# Patient Record
Sex: Female | Born: 1935 | Race: Black or African American | Hispanic: No | Smoking: Never smoker
Health system: Southern US, Community
[De-identification: ages and names within clinical notes are randomized; demographics above are authoritative.]

## PROBLEM LIST (undated history)

## (undated) ENCOUNTER — Emergency Department (HOSPITAL_COMMUNITY): Disposition: A | Discharge status: Home / Self Care | Payer: Medicare Other

## (undated) DIAGNOSIS — E119 Type 2 diabetes mellitus without complications: Secondary | ICD-10-CM

## (undated) DIAGNOSIS — F419 Anxiety disorder, unspecified: Secondary | ICD-10-CM

## (undated) DIAGNOSIS — M810 Age-related osteoporosis without current pathological fracture: Secondary | ICD-10-CM

## (undated) DIAGNOSIS — N189 Chronic kidney disease, unspecified: Secondary | ICD-10-CM

## (undated) DIAGNOSIS — K219 Gastro-esophageal reflux disease without esophagitis: Secondary | ICD-10-CM

## (undated) DIAGNOSIS — F0390 Unspecified dementia without behavioral disturbance: Secondary | ICD-10-CM

---

## 2000-02-21 ENCOUNTER — Encounter: Payer: Self-pay | Admitting: Emergency Medicine

## 2000-02-21 ENCOUNTER — Emergency Department (HOSPITAL_COMMUNITY): Admission: EM | Admit: 2000-02-21 | Discharge: 2000-02-21 | Payer: Self-pay | Admitting: Emergency Medicine

## 2003-04-19 ENCOUNTER — Emergency Department (HOSPITAL_COMMUNITY): Admission: EM | Admit: 2003-04-19 | Discharge: 2003-04-19 | Payer: Self-pay | Admitting: Family Medicine

## 2003-08-19 ENCOUNTER — Emergency Department (HOSPITAL_COMMUNITY): Admission: EM | Admit: 2003-08-19 | Discharge: 2003-08-19 | Payer: Self-pay | Admitting: Family Medicine

## 2003-11-13 ENCOUNTER — Emergency Department (HOSPITAL_COMMUNITY): Admission: EM | Admit: 2003-11-13 | Discharge: 2003-11-13 | Payer: Self-pay | Admitting: Family Medicine

## 2004-03-10 ENCOUNTER — Emergency Department (HOSPITAL_COMMUNITY): Admission: EM | Admit: 2004-03-10 | Discharge: 2004-03-10 | Payer: Self-pay | Admitting: Family Medicine

## 2004-04-26 ENCOUNTER — Emergency Department (HOSPITAL_COMMUNITY): Admission: EM | Admit: 2004-04-26 | Discharge: 2004-04-26 | Payer: Self-pay | Admitting: Emergency Medicine

## 2005-03-13 ENCOUNTER — Emergency Department (HOSPITAL_COMMUNITY): Admission: EM | Admit: 2005-03-13 | Discharge: 2005-03-13 | Payer: Self-pay | Admitting: Family Medicine

## 2005-06-29 ENCOUNTER — Emergency Department (HOSPITAL_COMMUNITY): Admission: EM | Admit: 2005-06-29 | Discharge: 2005-06-29 | Payer: Self-pay | Admitting: Family Medicine

## 2005-07-23 ENCOUNTER — Emergency Department (HOSPITAL_COMMUNITY): Admission: EM | Admit: 2005-07-23 | Discharge: 2005-07-23 | Payer: Self-pay | Admitting: Emergency Medicine

## 2005-09-07 ENCOUNTER — Emergency Department (HOSPITAL_COMMUNITY): Admission: EM | Admit: 2005-09-07 | Discharge: 2005-09-07 | Payer: Self-pay | Admitting: Family Medicine

## 2006-08-01 ENCOUNTER — Emergency Department (HOSPITAL_COMMUNITY): Admission: EM | Admit: 2006-08-01 | Discharge: 2006-08-01 | Payer: Self-pay | Admitting: Family Medicine

## 2006-11-02 ENCOUNTER — Encounter: Admission: RE | Admit: 2006-11-02 | Discharge: 2006-11-02 | Payer: Self-pay | Admitting: Orthopedic Surgery

## 2008-04-04 ENCOUNTER — Emergency Department (HOSPITAL_COMMUNITY): Admission: EM | Admit: 2008-04-04 | Discharge: 2008-04-04 | Payer: Self-pay | Admitting: Emergency Medicine

## 2008-08-27 ENCOUNTER — Emergency Department (HOSPITAL_COMMUNITY): Admission: EM | Admit: 2008-08-27 | Discharge: 2008-08-27 | Payer: Self-pay | Admitting: Family Medicine

## 2011-05-11 ENCOUNTER — Encounter (HOSPITAL_COMMUNITY): Payer: Self-pay

## 2011-05-11 ENCOUNTER — Other Ambulatory Visit: Payer: Self-pay

## 2011-05-11 ENCOUNTER — Observation Stay (HOSPITAL_COMMUNITY)
Admission: EM | Admit: 2011-05-11 | Discharge: 2011-05-14 | Disposition: A | Discharge status: Home / Self Care | Payer: Medicare Other | Attending: Internal Medicine | Admitting: Internal Medicine

## 2011-05-11 ENCOUNTER — Emergency Department (HOSPITAL_COMMUNITY): Payer: Medicare Other

## 2011-05-11 DIAGNOSIS — I10 Essential (primary) hypertension: Secondary | ICD-10-CM

## 2011-05-11 DIAGNOSIS — G309 Alzheimer's disease, unspecified: Secondary | ICD-10-CM | POA: Insufficient documentation

## 2011-05-11 DIAGNOSIS — F0280 Dementia in other diseases classified elsewhere without behavioral disturbance: Secondary | ICD-10-CM | POA: Insufficient documentation

## 2011-05-11 DIAGNOSIS — E785 Hyperlipidemia, unspecified: Secondary | ICD-10-CM

## 2011-05-11 DIAGNOSIS — E559 Vitamin D deficiency, unspecified: Secondary | ICD-10-CM | POA: Insufficient documentation

## 2011-05-11 DIAGNOSIS — I2699 Other pulmonary embolism without acute cor pulmonale: Principal | ICD-10-CM | POA: Insufficient documentation

## 2011-05-11 DIAGNOSIS — E7800 Pure hypercholesterolemia, unspecified: Secondary | ICD-10-CM | POA: Insufficient documentation

## 2011-05-11 DIAGNOSIS — M810 Age-related osteoporosis without current pathological fracture: Secondary | ICD-10-CM | POA: Insufficient documentation

## 2011-05-11 DIAGNOSIS — F0390 Unspecified dementia without behavioral disturbance: Secondary | ICD-10-CM

## 2011-05-11 DIAGNOSIS — R079 Chest pain, unspecified: Secondary | ICD-10-CM | POA: Insufficient documentation

## 2011-05-11 LAB — COMPREHENSIVE METABOLIC PANEL
AST: 16 U/L (ref 0–37)
Albumin: 3.7 g/dL (ref 3.5–5.2)
Calcium: 9.9 mg/dL (ref 8.4–10.5)
Creatinine, Ser: 0.81 mg/dL (ref 0.50–1.10)

## 2011-05-11 LAB — POCT I-STAT TROPONIN I: Troponin i, poc: 0 ng/mL (ref 0.00–0.08)

## 2011-05-11 LAB — PROTIME-INR: INR: 1.01 (ref 0.00–1.49)

## 2011-05-11 LAB — APTT: aPTT: 41 seconds — ABNORMAL HIGH (ref 24–37)

## 2011-05-11 LAB — CBC
MCH: 29.9 pg (ref 26.0–34.0)
MCV: 91.8 fL (ref 78.0–100.0)
Platelets: 270 10*3/uL (ref 150–400)
RDW: 13 % (ref 11.5–15.5)
WBC: 5.7 10*3/uL (ref 4.0–10.5)

## 2011-05-11 LAB — D-DIMER, QUANTITATIVE: D-Dimer, Quant: 3.54 ug/mL-FEU — ABNORMAL HIGH (ref 0.00–0.48)

## 2011-05-11 MED ORDER — IOHEXOL 350 MG/ML SOLN
70.0000 mL | Freq: Once | INTRAVENOUS | Status: AC | PRN
  Administered 2011-05-11: 70 mL via INTRAVENOUS

## 2011-05-11 MED ORDER — SODIUM CHLORIDE 0.9 % IV SOLN
20.0000 mL | INTRAVENOUS | Status: DC
  Administered 2011-05-11: 20 mL/h via INTRAVENOUS

## 2011-05-11 NOTE — ED Notes (Signed)
Pt. Resting on stretcher. Family at bedside. No needs voiced at this time.

## 2011-05-11 NOTE — ED Notes (Signed)
CXR done at bedside.

## 2011-05-11 NOTE — ED Notes (Signed)
Pt was brought in by EMS with c/o chest pain while she was doing physical therapy. Pt is alert but confused

## 2011-05-11 NOTE — ED Notes (Signed)
Pt was brought in by EMS with c/o chest pain onset when she was doing physical therapy. Pt was given 4 Baby ASA PTA

## 2011-05-11 NOTE — ED Provider Notes (Signed)
History     CSN: [id number]  Arrival date & time 05/11/11  4098   First MD Initiated Contact with Patient 05/11/11 1811      Chief Complaint  Patient presents with  . Chest Pain    HPI Pt was doing physical therapy today at the nursing home when she complained of chest pain.  Pt cannot recall the details.  No history of heart or lung disease.  Pt states she feels fine now.  Pt has had anxiety attacks before but this is not exactly like that.  Pt states all she knows is she was hurting.  She was given ASA by EMS which has helped. Patient denies any symptoms now. There's been no recent leg swelling, fevers, or cough. The history is limited by the fact that the patient has dementia. Past Medical History  Diagnosis Date  . Hypertension   . Hypercholesterolemia   . Alzheimer disease   . Osteoporosis   . Tendonitis   . Vitamin d deficiency     No past surgical history on file.  No family history on file.  History  Substance Use Topics  . Smoking status: Not on file  . Smokeless tobacco: Not on file  . Alcohol Use: No    OB History    Grav Para Term Preterm Abortions TAB SAB Ect Mult Living                  Review of Systems  Unable to perform ROS: Dementia    Allergies  Review of patient's allergies indicates no known allergies.  Home Medications  No current outpatient prescriptions on file.  BP 157/71  Pulse 76  Temp(Src) 98.6 F (37 C) (Oral)  Resp 16  Wt 170 lb (77.111 kg)  SpO2 97%  Physical Exam  Nursing note and vitals reviewed. Constitutional: She appears well-developed and well-nourished. No distress.  HENT:  Head: Normocephalic and atraumatic.  Right Ear: External ear normal.  Left Ear: External ear normal.  Eyes: Conjunctivae are normal. Right eye exhibits no discharge. Left eye exhibits no discharge. No scleral icterus.  Neck: Neck supple. No tracheal deviation present.  Cardiovascular: Normal rate, regular rhythm and intact distal pulses.     Pulmonary/Chest: Effort normal and breath sounds normal. No stridor. No respiratory distress. She has no wheezes. She has no rales.  Abdominal: Soft. Bowel sounds are normal. She exhibits no distension. There is no tenderness. There is no rebound and no guarding.  Musculoskeletal: She exhibits no edema and no tenderness.  Neurological: She is alert. She has normal strength. No sensory deficit. Cranial nerve deficit:  no gross defecits noted. She exhibits normal muscle tone. She displays no seizure activity. Coordination normal.  Skin: Skin is warm and dry. No rash noted.  Psychiatric: She has a normal mood and affect.    ED Course  Procedures (including critical care time)  Date: 05/11/2011  Rate: 72  Rhythm: normal sinus rhythm  QRS Axis: left  Intervals: normal  ST/T Wave abnormalities: normal  Conduction Disutrbances:nonspecific intraventricular conduction delay  Narrative Interpretation: Pacer spikes not noted on current EKG otherwise no significant changes  Old EKG Reviewed: changes noted   Labs Reviewed  CBC - Abnormal; Notable for the following:    Hemoglobin 11.7 (*)    HCT 35.9 (*)    All other components within normal limits  COMPREHENSIVE METABOLIC PANEL - Abnormal; Notable for the following:    Glucose, Bld 113 (*)    Total Bilirubin 0.2 (*)  GFR calc non Af Amer 63 (*)    GFR calc Af Amer 73 (*)    All other components within normal limits  APTT - Abnormal; Notable for the following:    aPTT 41 (*)    All other components within normal limits  D-DIMER, QUANTITATIVE - Abnormal; Notable for the following:    D-Dimer, Quant 3.54 (*)    All other components within normal limits  PROTIME-INR  POCT I-STAT TROPONIN I  POCT I-STAT TROPONIN I   Ct Angio Chest W/cm &/or Wo Cm  05/12/2011  *RADIOLOGY REPORT*  Clinical Data: 78 year old with chest pain and shortness of breath.  CT ANGIOGRAPHY CHEST  Technique:  Multidetector CT imaging of the chest using the standard  protocol during bolus administration of intravenous contrast. Multiplanar reconstructed images including MIPs were obtained and reviewed to evaluate the vascular anatomy.  Contrast: 70mL OMNIPAQUE IOHEXOL 350 MG/ML IV SOLN  Comparison: Chest radiograph 05/11/2011  Findings: The main pulmonary arteries are patent.  There is a linear filling defect within the right lower lobe pulmonary artery near a branch point.  This is best seen on image 6, image 145. There is concern for a possible filling defect in a subsegmental branch in the right lower lobe on image 168.  There are no other definite filling defects within the pulmonary arterial tree.  No evidence for chest lymphadenopathy.  There are coronary artery calcifications.  There is a small hiatal hernia.  No significant pericardial or pleural fluid.  There is a large cystic type structure in the left upper abdomen that measures up to 6.9 cm. This cyst is causing mass effect on the pancreas and could be originating from the left kidney. Multiple low-density structures throughout the visualized liver most likely represent cysts.  The trachea and mainstem bronchi are patent.  No acute bony abnormality.  Lungs are clear.  IMPRESSION: There are small filling defects in the right lower lobe pulmonary artery near a branch point.  There may be a thrombosed sub segmental branch within the right lower lobe.  Findings raise concern for pulmonary emboli but the chronicity is unknown.  This could represent subacute or chronic thrombus.  Evidence for multiple hepatic cysts.  There is a large cystic structure in the left upper abdomen which is incompletely imaged and could be arising from the left kidney.  Hiatal hernia.  These results were called by telephone on 05/12/2011  at  12:03 a.m. to  Dr. Lynelle Doctor, who verbally acknowledged these results.  Original Report Authenticated By: Richarda Overlie, M.D.   Dg Chest Portable 1 View  05/11/2011  *RADIOLOGY REPORT*  Clinical Data: Chest  pain.  Hypertension.  PORTABLE CHEST - 1 VIEW  Comparison: None.  Findings: The heart size is within normal limits for portable technique.  Both lungs are well aerated and are clear.  No evidence of pleural effusion.  No evidence of tracheal deviation.  IMPRESSION: No acute findings.  Original Report Authenticated By: Danae Orleans, M.D.     1. Chest pain   2. Pulmonary embolism       MDM  SUspect acute PE with her increased d dimer, ct findings  and symptoms.  Will admit the patient to the hospital for further treatment.  Discussed with Dr Gilford Silvius.  We will start xeralta        Celene Kras, MD 05/12/11 [phone]

## 2011-05-12 ENCOUNTER — Encounter (HOSPITAL_COMMUNITY): Payer: Self-pay | Admitting: Internal Medicine

## 2011-05-12 DIAGNOSIS — I517 Cardiomegaly: Secondary | ICD-10-CM

## 2011-05-12 DIAGNOSIS — I2699 Other pulmonary embolism without acute cor pulmonale: Secondary | ICD-10-CM

## 2011-05-12 DIAGNOSIS — F0390 Unspecified dementia without behavioral disturbance: Secondary | ICD-10-CM | POA: Diagnosis present

## 2011-05-12 LAB — COMPREHENSIVE METABOLIC PANEL
ALT: 17 U/L (ref 0–35)
AST: 14 U/L (ref 0–37)
Albumin: 3.6 g/dL (ref 3.5–5.2)
Calcium: 10.1 mg/dL (ref 8.4–10.5)
GFR calc Af Amer: 85 mL/min — ABNORMAL LOW (ref >90)
Sodium: 144 mEq/L (ref 135–145)
Total Protein: 6.6 g/dL (ref 6.0–8.3)

## 2011-05-12 LAB — POCT I-STAT TROPONIN I: Troponin i, poc: 0.01 ng/mL (ref 0.00–0.08)

## 2011-05-12 LAB — CBC
MCV: 90.1 fL (ref 78.0–100.0)
Platelets: 265 10*3/uL (ref 150–400)
RDW: 12.9 % (ref 11.5–15.5)
WBC: 6.1 10*3/uL (ref 4.0–10.5)

## 2011-05-12 LAB — CARDIAC PANEL(CRET KIN+CKTOT+MB+TROPI)
CK, MB: 2.5 ng/mL (ref 0.3–4.0)
CK, MB: 2.3 ng/mL (ref 0.3–4.0)
Relative Index: 1.9 (ref 0.0–2.5)
Total CK: 120 U/L (ref 7–177)
Total CK: 126 U/L (ref 7–177)
Total CK: 120 U/L (ref 7–177)
Troponin I: <0.3 ng/mL (ref <0.30)

## 2011-05-12 MED ORDER — ACETAMINOPHEN 650 MG RE SUPP: 650.0000 mg | Freq: Four times a day (QID) | RECTAL | Status: DC | PRN

## 2011-05-12 MED ORDER — OLANZAPINE 2.5 MG PO TABS
2.5000 mg | ORAL_TABLET | Freq: Every day | ORAL | Status: DC
  Administered 2011-05-12 – 2011-05-13 (×2): 2.5 mg via ORAL
  Filled 2011-05-12 (×4): qty 1

## 2011-05-12 MED ORDER — ONDANSETRON HCL 4 MG/2ML IJ SOLN: 4.0000 mg | Freq: Four times a day (QID) | INTRAMUSCULAR | Status: DC | PRN

## 2011-05-12 MED ORDER — RIVAROXABAN 15 MG PO TABS
15.0000 mg | ORAL_TABLET | Freq: Two times a day (BID) | ORAL | Status: DC
  Filled 2011-05-12 (×2): qty 1

## 2011-05-12 MED ORDER — OYSTER CALCIUM 500 MG PO TABS
500.0000 mg | ORAL_TABLET | Freq: Two times a day (BID) | ORAL | Status: DC
Start: 2011-05-12 — End: 2011-05-14
  Administered 2011-05-12 – 2011-05-14 (×4): 500 mg via ORAL
  Filled 2011-05-12 (×8): qty 1

## 2011-05-12 MED ORDER — RIVAROXABAN 15 MG PO TABS
15.0000 mg | ORAL_TABLET | Freq: Two times a day (BID) | ORAL | Status: DC
  Administered 2011-05-12 – 2011-05-14 (×4): 15 mg via ORAL
  Filled 2011-05-12 (×7): qty 1

## 2011-05-12 MED ORDER — SODIUM CHLORIDE 0.9 % IV SOLN: INTRAVENOUS | Status: DC

## 2011-05-12 MED ORDER — ONDANSETRON HCL 4 MG PO TABS: 4.0000 mg | ORAL_TABLET | Freq: Four times a day (QID) | ORAL | Status: DC | PRN

## 2011-05-12 MED ORDER — DONEPEZIL HCL 10 MG PO TABS
10.0000 mg | ORAL_TABLET | Freq: Every day | ORAL | Status: DC
  Administered 2011-05-12 – 2011-05-13 (×2): 10 mg via ORAL
  Filled 2011-05-12 (×4): qty 1

## 2011-05-12 MED ORDER — RIVAROXABAN 15 MG PO TABS
15.0000 mg | ORAL_TABLET | ORAL | Status: AC
  Administered 2011-05-12: 15 mg via ORAL
  Filled 2011-05-12: qty 1

## 2011-05-12 MED ORDER — SODIUM CHLORIDE 0.9 % IJ SOLN
3.0000 mL | Freq: Two times a day (BID) | INTRAMUSCULAR | Status: DC
  Administered 2011-05-12 – 2011-05-14 (×3): 3 mL via INTRAVENOUS

## 2011-05-12 MED ORDER — VITAMIN D3 25 MCG (1000 UNIT) PO TABS
1000.0000 [IU] | ORAL_TABLET | Freq: Every day | ORAL | Status: DC
  Administered 2011-05-12 – 2011-05-14 (×2): 1000 [IU] via ORAL
  Filled 2011-05-12 (×4): qty 1

## 2011-05-12 MED ORDER — PANTOPRAZOLE SODIUM 40 MG PO TBEC
40.0000 mg | DELAYED_RELEASE_TABLET | Freq: Every day | ORAL | Status: DC
  Administered 2011-05-12 – 2011-05-14 (×2): 40 mg via ORAL
  Filled 2011-05-12 (×3): qty 1

## 2011-05-12 MED ORDER — LISINOPRIL 10 MG PO TABS
10.0000 mg | ORAL_TABLET | Freq: Every day | ORAL | Status: DC
  Administered 2011-05-12 – 2011-05-14 (×3): 10 mg via ORAL
  Filled 2011-05-12 (×4): qty 1

## 2011-05-12 MED ORDER — ALPRAZOLAM 0.25 MG PO TABS
0.2500 mg | ORAL_TABLET | Freq: Once | ORAL | Status: AC
  Administered 2011-05-12: 0.25 mg via ORAL
  Filled 2011-05-12: qty 1

## 2011-05-12 MED ORDER — ACETAMINOPHEN 325 MG PO TABS
650.0000 mg | ORAL_TABLET | Freq: Four times a day (QID) | ORAL | Status: DC | PRN
  Administered 2011-05-13: 650 mg via ORAL
  Filled 2011-05-12: qty 2

## 2011-05-12 MED ORDER — SIMVASTATIN 40 MG PO TABS
40.0000 mg | ORAL_TABLET | Freq: Every day | ORAL | Status: DC
  Administered 2011-05-12 – 2011-05-13 (×2): 40 mg via ORAL
  Filled 2011-05-12 (×4): qty 1

## 2011-05-12 MED ORDER — RIVAROXABAN 15 MG PO TABS
15.0000 mg | ORAL_TABLET | Freq: Two times a day (BID) | ORAL | Status: DC
  Administered 2011-05-12: 15 mg via ORAL
  Filled 2011-05-12 (×2): qty 1

## 2011-05-12 NOTE — Progress Notes (Signed)
*  PRELIMINARY RESULTS* Vascular Ultrasound Lower extremity venous duplex has been completed.  Preliminary findings: Bilaterally no evidence of DVT or baker's cyst.  Farrel Demark RDMS 05/12/2011, 11:12 AM

## 2011-05-12 NOTE — Progress Notes (Signed)
Utilization review complete 

## 2011-05-12 NOTE — ED Notes (Signed)
Pt. Transferred to the floor via stretcher with cardiac monitor and tele tech.

## 2011-05-12 NOTE — H&P (Signed)
Pamela Ramirez is an 78 y.o. female.   Patient lives in the dementia unit of [facility]. History provided by ER physician and patient's daughter. Chief Complaint: Chest pain. HPI: 78 year-old female with history of hypertension hyperlipidemia and dementia was brought into the ER patient complained of chest pain last evening. Patient states she has had chest pain but does not recall how long and is not able to characterize the pain. She points to the sternal area when asked which area was it hurting. Patient did not have any shortness of breath nausea vomiting cough palpitation dizziness or loss of consciousness. In the ER patient had EKG and cardiac enzymes which were negative. Since patient's d-dimer was high CT angiogram of the chest was done which was showing a filling defect which possibly is pulmonary embolism. Given the clinical history and lab work this most likely is pulmonary embolism. At this time patient has been started on xarelto. Patient be admitted for further observation. Patient is chest pain-free.  Past Medical History  Diagnosis Date  . Hypertension   . Hypercholesterolemia   . Alzheimer disease   . Osteoporosis   . Tendonitis   . Vitamin d deficiency     History reviewed. No pertinent past surgical history.  History reviewed. No pertinent family history. Social History:  reports that she has never smoked. She does not have any smokeless tobacco history on file. She reports that she does not drink alcohol or use illicit drugs.  Allergies: No Known Allergies  Medications Prior to Admission  Medication Dose Route Frequency Provider Last Rate Last Dose  . 0.9 %  sodium chloride infusion  20 mL Intravenous Continuous Celene Kras, MD 20 mL/hr at 05/11/11 1850 20 mL/hr at 05/11/11 1850  . iohexol (OMNIPAQUE) 350 MG/ML injection 70 mL  70 mL Intravenous Once PRN Medication Radiologist, MD   70 mL at 05/11/11 2301  . Rivaroxaban (XARELTO) tablet TABS 15 mg  15 mg Oral  To Minor Celene Kras, MD   15 mg at 05/12/11 0211   No current outpatient prescriptions on file as of 05/11/2011.    Results for orders placed during the hospital encounter of 05/11/11 (from the past 48 hour(s))  CBC     Status: Abnormal   Collection Time   05/11/11  6:42 PM      Component Value Range Comment   WBC 5.7  4.0 - 10.5 (K/uL)    RBC 3.91  3.87 - 5.11 (MIL/uL)    Hemoglobin 11.7 (*) 12.0 - 15.0 (g/dL)    HCT 16.1 (*) 09.6 - 46.0 (%)    MCV 91.8  78.0 - 100.0 (fL)    MCH 29.9  26.0 - 34.0 (pg)    MCHC 32.6  30.0 - 36.0 (g/dL)    RDW 04.5  40.9 - 81.1 (%)    Platelets 270  150 - 400 (K/uL)   COMPREHENSIVE METABOLIC PANEL     Status: Abnormal   Collection Time   05/11/11  6:42 PM      Component Value Range Comment   Sodium 139  135 - 145 (mEq/L)    Potassium 4.2  3.5 - 5.1 (mEq/L)    Chloride 104  96 - 112 (mEq/L)    CO2 26  19 - 32 (mEq/L)    Glucose, Bld 113 (*) 70 - 99 (mg/dL)    BUN 13  6 - 23 (mg/dL)    Creatinine, Ser 9.14  0.50 - 1.10 (mg/dL)  Calcium 9.9  8.4 - 10.5 (mg/dL)    Total Protein 6.7  6.0 - 8.3 (g/dL)    Albumin 3.7  3.5 - 5.2 (g/dL)    AST 16  0 - 37 (U/L)    ALT 18  0 - 35 (U/L)    Alkaline Phosphatase 59  39 - 117 (U/L)    Total Bilirubin 0.2 (*) 0.3 - 1.2 (mg/dL)    GFR calc non Af Amer 63 (*) >90 (mL/min)    GFR calc Af Amer 73 (*) >90 (mL/min)   PROTIME-INR     Status: Normal   Collection Time   05/11/11  6:42 PM      Component Value Range Comment   Prothrombin Time 13.5  11.6 - 15.2 (seconds)    INR 1.01  0.00 - 1.49    APTT     Status: Abnormal   Collection Time   05/11/11  6:42 PM      Component Value Range Comment   aPTT 41 (*) 24 - 37 (seconds)   D-DIMER, QUANTITATIVE     Status: Abnormal   Collection Time   05/11/11  6:42 PM      Component Value Range Comment   D-Dimer, Quant 3.54 (*) 0.00 - 0.48 (ug/mL-FEU)   POCT I-STAT TROPONIN I     Status: Normal   Collection Time   05/11/11  7:42 PM      Component Value Range Comment    Troponin i, poc 0.00  0.00 - 0.08 (ng/mL)    Comment 3            POCT I-STAT TROPONIN I     Status: Normal   Collection Time   05/11/11  9:28 PM      Component Value Range Comment   Troponin i, poc 0.00  0.00 - 0.08 (ng/mL)    Comment 3            POCT I-STAT TROPONIN I     Status: Normal   Collection Time   05/12/11 12:58 AM      Component Value Range Comment   Troponin i, poc 0.01  0.00 - 0.08 (ng/mL)    Comment 3             Ct Angio Chest W/cm &/or Wo Cm  05/12/2011  *RADIOLOGY REPORT*  Clinical Data: 78 year old with chest pain and shortness of breath.  CT ANGIOGRAPHY CHEST  Technique:  Multidetector CT imaging of the chest using the standard protocol during bolus administration of intravenous contrast. Multiplanar reconstructed images including MIPs were obtained and reviewed to evaluate the vascular anatomy.  Contrast: 70mL OMNIPAQUE IOHEXOL 350 MG/ML IV SOLN  Comparison: Chest radiograph 05/11/2011  Findings: The main pulmonary arteries are patent.  There is a linear filling defect within the right lower lobe pulmonary artery near a branch point.  This is best seen on image 6, image 145. There is concern for a possible filling defect in a subsegmental branch in the right lower lobe on image 168.  There are no other definite filling defects within the pulmonary arterial tree.  No evidence for chest lymphadenopathy.  There are coronary artery calcifications.  There is a small hiatal hernia.  No significant pericardial or pleural fluid.  There is a large cystic type structure in the left upper abdomen that measures up to 6.9 cm. This cyst is causing mass effect on the pancreas and could be originating from the left kidney. Multiple low-density structures throughout the visualized liver  most likely represent cysts.  The trachea and mainstem bronchi are patent.  No acute bony abnormality.  Lungs are clear.  IMPRESSION: There are small filling defects in the right lower lobe pulmonary artery near a  branch point.  There may be a thrombosed sub segmental branch within the right lower lobe.  Findings raise concern for pulmonary emboli but the chronicity is unknown.  This could represent subacute or chronic thrombus.  Evidence for multiple hepatic cysts.  There is a large cystic structure in the left upper abdomen which is incompletely imaged and could be arising from the left kidney.  Hiatal hernia.  These results were called by telephone on 05/12/2011  at  12:03 a.m. to  Dr. Lynelle Doctor, who verbally acknowledged these results.  Original Report Authenticated By: Richarda Overlie, M.D.   Dg Chest Portable 1 View  05/11/2011  *RADIOLOGY REPORT*  Clinical Data: Chest pain.  Hypertension.  PORTABLE CHEST - 1 VIEW  Comparison: None.  Findings: The heart size is within normal limits for portable technique.  Both lungs are well aerated and are clear.  No evidence of pleural effusion.  No evidence of tracheal deviation.  IMPRESSION: No acute findings.  Original Report Authenticated By: Danae Orleans, M.D.    Review of Systems  Constitutional: Negative.   HENT: Negative.   Eyes: Negative.   Respiratory: Negative.   Cardiovascular: Positive for chest pain.  Gastrointestinal: Negative.   Genitourinary: Negative.   Musculoskeletal: Negative.   Skin: Negative.   Neurological: Negative.   Endo/Heme/Allergies: Negative.   Psychiatric/Behavioral: Negative.     Blood pressure 108/49, pulse 56, temperature 98.6 F (37 C), temperature source Oral, resp. rate 22, weight 77.111 kg (170 lb), SpO2 93.00%. Physical Exam  Constitutional: She is oriented to person, place, and time. She appears well-developed and well-nourished. No distress.  HENT:  Head: Normocephalic and atraumatic.  Right Ear: External ear normal.  Left Ear: External ear normal.  Nose: Nose normal.  Mouth/Throat: Oropharynx is clear and moist. No oropharyngeal exudate.  Eyes: Conjunctivae are normal. Pupils are equal, round, and reactive to light.  Right eye exhibits no discharge. Left eye exhibits no discharge.  Neck: Normal range of motion. Neck supple.  Cardiovascular: Normal rate and regular rhythm.   Respiratory: Effort normal and breath sounds normal. No respiratory distress. She has no wheezes. She has no rales.  GI: Soft. Bowel sounds are normal. She exhibits no distension. There is no tenderness. There is no rebound.  Musculoskeletal: Normal range of motion. She exhibits no edema and no tenderness.  Neurological: She is alert and oriented to person, place, and time.       The patient is an oriented to her name and recognizes her daughter. Follows commands. Moves upper and lower extremities 5 x 5.  Skin: Skin is warm and dry. She is not diaphoretic.  Psychiatric: Her behavior is normal.     Assessment/Plan #1. Pulmonary embolism - we will continue xarelto. We'll also get a Doppler of the lower extremity. Cycle cardiac markers. #2. History of hypertension, hyperlipidemia, dementia - continue present medications.  CODE STATUS - full code.  Lucero Ide N. 05/12/2011, 3:53 AM

## 2011-05-12 NOTE — Plan of Care (Signed)
Problem: Consults Goal: Diagnosis - Venous Thromboembolism (VTE) Choose a selection Outcome: Completed/Met Date Met:  05/12/11 PE (Pulmonary Embolism)

## 2011-05-12 NOTE — Progress Notes (Signed)
*  PRELIMINARY RESULTS* Echocardiogram 2D Echocardiogram has been performed.  Katheren Puller 05/12/2011, 3:55 PM

## 2011-05-12 NOTE — Progress Notes (Addendum)
Patient ID: Pamela Ramirez, female   DOB: [birth year], 78 y.o.   MRN: [id number]  Subjective: No events overnight. Patient denies chest pain, shortness of breath, abdominal pain.  Objective:  Vital signs in last 24 hours:  Filed Vitals:   05/12/11 0130 05/12/11 0145 05/12/11 0200 05/12/11 0430  BP: 113/44 111/54 108/49 132/67  Pulse: 69 64 56 61  Temp:    96.9 F (36.1 C)  TempSrc:    Oral  Resp: 14 21 22 18   Height:    5\' 4"  (1.626 m)  Weight:    76 kg (167 lb 8.8 oz)  SpO2: 97% 96% 93% 95%    Intake/Output from previous day:  No intake or output data in the 24 hours ending 05/12/11 1111  Physical Exam: General: Alert, awake, oriented to name and place, in no acute distress. HEENT: No bruits, no goiter. Moist mucous membranes, no scleral icterus, no conjunctival pallor. Heart: Regular rate and rhythm, S1/S2 +, no murmurs, rubs, gallops. Lungs: Clear to auscultation bilaterally. No wheezing, no rhonchi, no rales.  Abdomen: Soft, nontender, nondistended, positive bowel sounds. Extremities: No clubbing or cyanosis, no pitting edema,  positive pedal pulses. Neuro: Grossly nonfocal.  Lab Results:  Basic Metabolic Panel:    Component Value Date/Time   NA 144 05/12/2011 0525   K 3.6 05/12/2011 0525   CL 108 05/12/2011 0525   CO2 28 05/12/2011 0525   BUN 11 05/12/2011 0525   CREATININE 0.76 05/12/2011 0525   GLUCOSE 123* 05/12/2011 0525   CALCIUM 10.1 05/12/2011 0525   CBC:    Component Value Date/Time   WBC 6.1 05/12/2011 0525   HGB 12.7 05/12/2011 0525   HCT 36.5 05/12/2011 0525   PLT 265 05/12/2011 0525   MCV 90.1 05/12/2011 0525      Lab 05/12/11 0525 05/11/11 1842  WBC 6.1 5.7  HGB 12.7 11.7*  HCT 36.5 35.9*  PLT 265 270  MCV 90.1 91.8  MCH 31.4 29.9  MCHC 34.8 32.6  RDW 12.9 13.0  LYMPHSABS -- --  MONOABS -- --  EOSABS -- --  BASOSABS -- --  BANDABS -- --    Lab 05/12/11 0525 05/11/11 1842  NA 144 139  K 3.6 4.2  CL 108 104  CO2 28 26  GLUCOSE 123*  113*  BUN 11 13  CREATININE 0.76 0.81  CALCIUM 10.1 9.9  MG -- --    Lab 05/11/11 1842  INR 1.01  PROTIME --   Cardiac markers:  Lab 05/12/11 0525  CKMB 2.5  TROPONINI <0.30  MYOGLOBIN --    Studies/Results: Ct Angio Chest W/cm &/or Wo Cm 05/12/2011  IMPRESSION: There are small filling defects in the right lower lobe pulmonary artery near a branch point.  There may be a thrombosed sub segmental branch within the right lower lobe.  Findings raise concern for pulmonary emboli but the chronicity is unknown.  This could represent subacute or chronic thrombus.  Evidence for multiple hepatic cysts.  There is a large cystic structure in the left upper abdomen which is incompletely imaged and could be arising from the left kidney.  Hiatal hernia.  These results were called by telephone on 05/12/2011  at  12:03 a.m. to  Dr. Lynelle Doctor, who verbally acknowledged these results.    Dg Chest Portable 1 View 05/11/2011   IMPRESSION: No acute findings.    Medications: Scheduled Meds:   . cholecalciferol  1,000 Units Oral Daily  . donepezil  10 mg Oral QHS  .  lisinopril  10 mg Oral Daily  . OLANZapine  2.5 mg Oral QHS  . oyster calcium  500 mg of elemental calcium Oral BID  . pantoprazole  40 mg Oral Q1200  . Rivaroxaban  15 mg Oral To Minor  . rivaroxaban  15 mg Oral BID  . simvastatin  40 mg Oral q1800  . sodium chloride  3 mL Intravenous Q12H   Continuous Infusions:   . sodium chloride    . DISCONTD: sodium chloride 20 mL/hr (05/11/11 1850)   PRN Meds:.acetaminophen, acetaminophen, iohexol, ondansetron (ZOFRAN) IV, ondansetron  Assessment/Plan:  Principal Problem:  *Pulmonary embolism - pt is clinically improving - please note the findings on CTA noted above - pt is maintaining good oxygen saturations and is not in hemodynamic stress - will continue Rivaroxaban  - follow up on CBC, please note that no active signs of bleeding noted and Hg/Hct are within pt's baseline and stable -  LE doppler pending - 2 D ECHO pending  Active Problems:  HTN (hypertension) - remains stable   Hyperlipidemia - will continue statin as noted above   Dementia - stable    EDUCATION - test results and diagnostic studies were discussed with patient and daughter at bedside - patient verbalized the understanding - questions were answered at the bedside and contact information was provided for additional questions or concerns - PT evaluation, likely d/c in AM   LOS: 1 day   MAGICK-MYERS, ISKRA 05/12/2011, 11:11 AM  [facility] Pager: [phone]

## 2011-05-13 MED ORDER — LORAZEPAM 2 MG/ML IJ SOLN: 0.5000 mg | Freq: Once | INTRAMUSCULAR | Status: DC

## 2011-05-13 NOTE — Progress Notes (Signed)
Patient ID: Pamela Ramirez, female   DOB: 1922-02-13, 78 y.o.   MRN: [id number]  Subjective: No events overnight. Patient denies chest pain, shortness of breath, abdominal pain.   Objective:  Vital signs in last 24 hours:  Filed Vitals:   05/12/11 2100 05/12/11 2230 05/13/11 0409 05/13/11 1400  BP: 183/76 152/78 111/64 85/49  Pulse: 79  72 80  Temp: 98.6 F (37 C)  97.9 F (36.6 C) 99.1 F (37.3 C)  TempSrc: Oral   Oral  Resp: 20  20 17   Height:      Weight:      SpO2: 98%  93% 93%    Intake/Output from previous day:   Intake/Output Summary (Last 24 hours) at 05/13/11 1627 Last data filed at 05/13/11 1200  Gross per 24 hour  Intake    240 ml  Output      0 ml  Net    240 ml    Physical Exam: General: Alert, awake, oriented to name only, in no acute distress. HEENT: No bruits, no goiter. Moist mucous membranes, no scleral icterus, no conjunctival pallor. Heart: Regular rate and rhythm, S1/S2 +, no murmurs, rubs, gallops. Lungs: Clear to auscultation bilaterally. No wheezing, no rhonchi, no rales.  Abdomen: Soft, nontender, nondistended, positive bowel sounds. Extremities: No clubbing or cyanosis, no pitting edema,  positive pedal pulses. Neuro: Grossly nonfocal.  Lab Results:  Basic Metabolic Panel:    Component Value Date/Time   NA 144 05/12/2011 0525   K 3.6 05/12/2011 0525   CL 108 05/12/2011 0525   CO2 28 05/12/2011 0525   BUN 11 05/12/2011 0525   CREATININE 0.76 05/12/2011 0525   GLUCOSE 123* 05/12/2011 0525   CALCIUM 10.1 05/12/2011 0525   CBC:    Component Value Date/Time   WBC 6.1 05/12/2011 0525   HGB 12.7 05/12/2011 0525   HCT 36.5 05/12/2011 0525   PLT 265 05/12/2011 0525   MCV 90.1 05/12/2011 0525      Lab 05/12/11 0525 05/11/11 1842  WBC 6.1 5.7  HGB 12.7 11.7*  HCT 36.5 35.9*  PLT 265 270  MCV 90.1 91.8  MCH 31.4 29.9  MCHC 34.8 32.6  RDW 12.9 13.0  LYMPHSABS -- --  MONOABS -- --  EOSABS -- --  BASOSABS -- --  BANDABS -- --    Lab  05/12/11 0525 05/11/11 1842  NA 144 139  K 3.6 4.2  CL 108 104  CO2 28 26  GLUCOSE 123* 113*  BUN 11 13  CREATININE 0.76 0.81  CALCIUM 10.1 9.9  MG -- --    Lab 05/11/11 1842  INR 1.01  PROTIME --   Cardiac markers:  Lab 05/12/11 2035 05/12/11 1222 05/12/11 0525  CKMB 2.3 2.5 2.5  TROPONINI <0.30 <0.30 <0.30  MYOGLOBIN -- -- --   No components found with this basename: POCBNP:3 No results found for this or any previous visit (from the past 240 hour(s)).  Studies/Results: Ct Angio Chest W/cm &/or Wo Cm  05/12/2011  *RADIOLOGY REPORT*  Clinical Data: 78 year old with chest pain and shortness of breath.  CT ANGIOGRAPHY CHEST  Technique:  Multidetector CT imaging of the chest using the standard protocol during bolus administration of intravenous contrast. Multiplanar reconstructed images including MIPs were obtained and reviewed to evaluate the vascular anatomy.  Contrast: 70mL OMNIPAQUE IOHEXOL 350 MG/ML IV SOLN  Comparison: Chest radiograph 05/11/2011  Findings: The main pulmonary arteries are patent.  There is a linear filling defect within the right lower  lobe pulmonary artery near a branch point.  This is best seen on image 6, image 145. There is concern for a possible filling defect in a subsegmental branch in the right lower lobe on image 168.  There are no other definite filling defects within the pulmonary arterial tree.  No evidence for chest lymphadenopathy.  There are coronary artery calcifications.  There is a small hiatal hernia.  No significant pericardial or pleural fluid.  There is a large cystic type structure in the left upper abdomen that measures up to 6.9 cm. This cyst is causing mass effect on the pancreas and could be originating from the left kidney. Multiple low-density structures throughout the visualized liver most likely represent cysts.  The trachea and mainstem bronchi are patent.  No acute bony abnormality.  Lungs are clear.  IMPRESSION: There are small filling  defects in the right lower lobe pulmonary artery near a branch point.  There may be a thrombosed sub segmental branch within the right lower lobe.  Findings raise concern for pulmonary emboli but the chronicity is unknown.  This could represent subacute or chronic thrombus.  Evidence for multiple hepatic cysts.  There is a large cystic structure in the left upper abdomen which is incompletely imaged and could be arising from the left kidney.  Hiatal hernia.  These results were called by telephone on 05/12/2011  at  12:03 a.m. to  Dr. Lynelle Doctor, who verbally acknowledged these results.  Original Report Authenticated By: Richarda Overlie, M.D.   Dg Chest Portable 1 View  05/11/2011  *RADIOLOGY REPORT*  Clinical Data: Chest pain.  Hypertension.  PORTABLE CHEST - 1 VIEW  Comparison: None.  Findings: The heart size is within normal limits for portable technique.  Both lungs are well aerated and are clear.  No evidence of pleural effusion.  No evidence of tracheal deviation.  IMPRESSION: No acute findings.  Original Report Authenticated By: Danae Orleans, M.D.    Medications: Scheduled Meds:   . ALPRAZolam  0.25 mg Oral Once  . cholecalciferol  1,000 Units Oral Daily  . donepezil  10 mg Oral QHS  . lisinopril  10 mg Oral Daily  . LORazepam  0.5 mg Intravenous Once  . OLANZapine  2.5 mg Oral QHS  . oyster calcium  500 mg of elemental calcium Oral BID  . pantoprazole  40 mg Oral Q1200  . rivaroxaban  15 mg Oral BID WC  . simvastatin  40 mg Oral q1800  . sodium chloride  3 mL Intravenous Q12H   Continuous Infusions:   . sodium chloride     PRN Meds:.acetaminophen, acetaminophen, ondansetron (ZOFRAN) IV, ondansetron  Assessment/Plan:  Principal Problem:  *Pulmonary embolism  - pt is clinically improving  - please note the findings on CTA noted above  - pt is maintaining good oxygen saturations and is not in hemodynamic stress  - will continue Rivaroxaban  - follow up on CBC, please note that no active  signs of bleeding noted and Hg/Hct are within pt's baseline and stable  - 2 D ECHO pending   Active Problems:  HTN (hypertension)  - remains stable   Hyperlipidemia  - will continue statin as noted above   Dementia  - stable   EDUCATION  - test results and diagnostic studies were discussed with patient and daughter at bedside  - patient verbalized the understanding  - questions were answered at the bedside and contact information was provided for additional questions or concerns  - PT evaluation,  likely d/c in AM   LOS: 2 days   MAGICK-Miho Monda 05/13/2011, 4:27 PM  [facility] Pager: [phone]

## 2011-05-13 NOTE — Progress Notes (Signed)
PT Cancellation Note  Treatment cancelled today due to patient's refusal to participate. Pt stated she has a headache and is too tired to walk right now. Son in [place] in room stated pt just woke up. He also states pt lives at ALF where she ambulated independently with a straight cane. Will re-attempt PT eval at a later time. RN notified of pt's HA pain.  Ralene Bathe Kistler 05/13/2011, 11:46 AM [phone]

## 2011-05-13 NOTE — Progress Notes (Signed)
Clinical Social Worker completed the psychosocial assessment which can be found in the shadow chart.  Patient was residing at [facility] ([facility]) prior to hospital admission.  Spoke with patient and patient son at bedside and patient daughter over the phone who are all agreeable for patient to return to [facility] at discharge.  CSW contacted facility over the phone who states they will need to contact correct personal to determine patient return.      Clinical Social Worker will continue to follow up with PT/OT recommendations as well as the facility and facilitate patient discharge plans once medically ready.  [address] [place], [place] [phone]

## 2011-05-13 NOTE — Progress Notes (Signed)
Pt heard screaming down hall while phlebotomy in room to obtain AM labs.  Pt observed kicking/hitting phlebotomist and screaming at staff.  Pt. combative and tearful expressing agitation.  Attempts to calm patient unsuccessful.  MD notified-order received for IV ativan.  Pt. Refusing any staff to touch her.  Pt. sitting in chair tearful but cooperative to staff after 15 minutes.  IV ativan not given d/t patient becoming cooperative and less threatening to staff.  Will continue to monitor.

## 2011-05-14 ENCOUNTER — Encounter (HOSPITAL_COMMUNITY): Payer: Self-pay | Admitting: Cardiology

## 2011-05-14 MED ORDER — RIVAROXABAN 15 MG PO TABS: 15.0000 mg | ORAL_TABLET | Freq: Two times a day (BID) | ORAL | Status: DC

## 2011-05-14 NOTE — Progress Notes (Signed)
   CARE MANAGEMENT NOTE 05/14/2011  Patient:  Pamela Ramirez, Pamela Ramirez   Account Number:  [id number]  Date Initiated:  05/14/2011  Documentation initiated by:  Letha Cape  Subjective/Objective Assessment:   dx pe  admit- from [facility]F     Action/Plan:   contine progression of care   Anticipated DC Date:  05/14/2011   Anticipated DC Plan:  ASSISTED LIVING / REST HOME  In-house referral  Clinical Social Worker      DC Associate Professor  CM consult      [facility] Choice  HOME HEALTH   Choice offered to / List presented to:             Status of service:  Completed, signed off Medicare Important Message given?   (If response is "NO", the following Medicare IM given date fields will be blank) Date Medicare IM given:   Date Additional Medicare IM given:    Discharge Disposition:  ASSISTED LIVING  Per UR Regulation:    Comments:  05/14/11 14:50 Letha Cape RN, BSN [phone] patient is from [facility]F.  Patient for dc to day back to ALF , will need pt/ot / st and RN.  I called ALF and they would like for me to fax orders over and also have CSW put hh therapies on FL2.  CSW states therapies are listed on fl2 and I faxed orders over to ALF.  The ALF will use their own [facility] agency and set this up.

## 2011-05-14 NOTE — Progress Notes (Signed)
CSW spoke with Elnita Maxwell, Charity fundraiser at [employer]. Pt can return with maximum support for ALF to continue serving her needs. CSW spoke with CM re: order for Home health RN, PT, OT, and ST. CSW will fax over FL2 and dc summary and Pt/ST evals to ALF. Family able to take Pt back to ALF at DC. Pt resting at this time. Frederico Hamman, LCSW [phone]

## 2011-05-14 NOTE — Evaluation (Signed)
Clinical/Bedside Swallow Evaluation Patient Details  Name: Pamela Ramirez MRN: [id number] DOB: 1922/03/09 Today's Date: 05/14/2011  Past Medical History:  Past Medical History  Diagnosis Date  . Hypertension   . Hypercholesterolemia   . Alzheimer disease   . Osteoporosis   . Tendonitis   . Vitamin d deficiency    Past Surgical History: History reviewed. No pertinent past surgical history. HPI:  78 year-old female with history of hypertension, hyperlipidemia, hiatal hernia, and dementia was brought into the ER c/o chest pain 05/11/11. CT angiogram of the chest was done which was showing a filling defect which possibly is pulmonary embolism. Given the clinical history and lab work this most likely is pulmonary embolism. A nurse at the facility in which the pt resides raised concerns that the pt has difficulty swallowing pills, and a bedside swallow eval was ordered.   Assessment/Recommendations/Treatment Plan Suspected Esophageal Findings Suspected Esophageal Findings: Belching;Globus sensation;Other (comment) (pt reports feeling like her pills "might come back up")  SLP Assessment Clinical Impression Statement: Pt presents with suspected primary esophageal dysphagia given h/o hiatal hernia, frequent belching, globus sensation with solid foods, and intermittent throat clear with solid foods. Pt's son reports a chronic throat clear without PO intake as well. Despite above, pt appears to be protecting her airway at this time with a functional oropharyngeal swallow. Recommend short-term SLP f/u for education and instruction of reflux precautions and a regluar diet with thin liquids. Defer medical management of GERD-like symptoms to MD. Risk for Aspiration: Mild Other Related Risk Factors: History of esophageal-related issues (hiatal hernia)  Swallow Evaluation Recommendations Recommended Consults: Other (Comment) (refer management of GERD-like symptoms to MD) Solid Consistency:  Regular Liquid Consistency: Thin Liquid Administration via: Cup;No straw Medication Administration: Whole meds with liquid (crush large pills in puree as needed) Supervision: Patient able to self feed;Full supervision/cueing for compensatory strategies Compensations: Slow rate;Small sips/bites;Follow solids with liquid Postural Changes and/or Swallow Maneuvers: Seated upright 90 degrees;Upright 30-60 min after meal Oral Care Recommendations: Oral care BID Follow up Recommendations: Skilled Nursing facility (short term to re-enforce use of strategies)  Treatment Plan Treatment Plan Recommendations: Defer treatment plan to SLP at (Comment) (SNF, patient with plans to d/c today)  Individuals Consulted Consulted and Agree with Results and Recommendations: Patient;Family member/caregiver Family Member Consulted: son, daughter, and son-in-law  Maxcine Ham 05/14/2011,11:41 AM   Maxcine Ham, SLP Student

## 2011-05-14 NOTE — Discharge Summary (Signed)
Physician Discharge Summary  Pamela Ramirez MRN: [id number] DOB/AGE: 76-Oct-1923 76 y.o.  PCP: Sheila Oats, MD, MD   Admit date: 05/11/2011 Discharge date: 05/14/2011  Discharge Diagnoses:   *Pulmonary embolism  HTN (hypertension)  Hyperlipidemia  Dementia   Medication List  As of 05/14/2011 12:52 PM   TAKE these medications         acetaminophen 650 MG CR tablet   Commonly known as: TYLENOL   Take 650 mg by mouth 3 (three) times daily.      aspirin 81 MG chewable tablet   Chew 81 mg by mouth daily.      atorvastatin 20 MG tablet   Commonly known as: LIPITOR   Take 20 mg by mouth daily.      donepezil 10 MG tablet   Commonly known as: ARICEPT   Take 10 mg by mouth at bedtime as needed.      LIPO-FLAVONOID PLUS PO   Take 1 tablet by mouth daily.      lisinopril 10 MG tablet   Commonly known as: PRINIVIL,ZESTRIL   Take 10 mg by mouth daily.      multivitamins ther. w/minerals Tabs   Take 1 tablet by mouth daily.      OLANZapine 2.5 MG tablet   Commonly known as: ZYPREXA   Take 2.5 mg by mouth at bedtime.      omeprazole 20 MG capsule   Commonly known as: PRILOSEC   Take 20 mg by mouth daily.      oyster calcium 500 MG Tabs   Take 500 mg of elemental calcium by mouth 2 (two) times daily.      Rivaroxaban 15 MG Tabs tablet   Commonly known as: XARELTO   Take 1 tablet (15 mg total) by mouth 2 (two) times daily with a meal.      Vitamin D3 1000 UNITS Caps   Take 1 capsule by mouth daily.            Discharge Condition: stable   Disposition: Final discharge disposition not confirmed   Consults:none   Significant Diagnostic Studies: Ct Angio Chest W/cm &/or Wo Cm  05/12/2011  *RADIOLOGY REPORT*  Clinical Data: 78 year old with chest pain and shortness of breath.  CT ANGIOGRAPHY CHEST  Technique:  Multidetector CT imaging of the chest using the standard protocol during bolus administration of intravenous contrast. Multiplanar reconstructed  images including MIPs were obtained and reviewed to evaluate the vascular anatomy.  Contrast: 70mL OMNIPAQUE IOHEXOL 350 MG/ML IV SOLN  Comparison: Chest radiograph 05/11/2011  Findings: The main pulmonary arteries are patent.  There is a linear filling defect within the right lower lobe pulmonary artery near a branch point.  This is best seen on image 6, image 145. There is concern for a possible filling defect in a subsegmental branch in the right lower lobe on image 168.  There are no other definite filling defects within the pulmonary arterial tree.  No evidence for chest lymphadenopathy.  There are coronary artery calcifications.  There is a small hiatal hernia.  No significant pericardial or pleural fluid.  There is a large cystic type structure in the left upper abdomen that measures up to 6.9 cm. This cyst is causing mass effect on the pancreas and could be originating from the left kidney. Multiple low-density structures throughout the visualized liver most likely represent cysts.  The trachea and mainstem bronchi are patent.  No acute bony abnormality.  Lungs are clear.  IMPRESSION: There are  small filling defects in the right lower lobe pulmonary artery near a branch point.  There may be a thrombosed sub segmental branch within the right lower lobe.  Findings raise concern for pulmonary emboli but the chronicity is unknown.  This could represent subacute or chronic thrombus.  Evidence for multiple hepatic cysts.  There is a large cystic structure in the left upper abdomen which is incompletely imaged and could be arising from the left kidney.  Hiatal hernia.  These results were called by telephone on 05/12/2011  at  12:03 a.m. to  Dr. Lynelle Doctor, who verbally acknowledged these results.  Original Report Authenticated By: Richarda Overlie, M.D.   Dg Chest Portable 1 View  05/11/2011  *RADIOLOGY REPORT*  Clinical Data: Chest pain.  Hypertension.  PORTABLE CHEST - 1 VIEW  Comparison: None.  Findings: The heart size  is within normal limits for portable technique.  Both lungs are well aerated and are clear.  No evidence of pleural effusion.  No evidence of tracheal deviation.  IMPRESSION: No acute findings.  Original Report Authenticated By: Danae Orleans, M.D.    Microbiology: No results found for this or any previous visit (from the past 240 hour(s)).   Labs: Results for orders placed during the hospital encounter of 05/11/11 (from the past 48 hour(s))  CARDIAC PANEL(CRET KIN+CKTOT+MB+TROPI)     Status: Normal   Collection Time   05/12/11  8:35 PM      Component Value Range Comment   Total CK 120  7 - 177 (U/L)    CK, MB 2.3  0.3 - 4.0 (ng/mL)    Troponin I <0.30  <0.30 (ng/mL)    Relative Index 1.9  0.0 - 2.5       HPI 78 year-old female with history of hypertension hyperlipidemia and dementia was brought into the ER patient complained of chest pain last evening. Patient states she has had chest pain but does not recall how long and is not able to characterize the pain. She points to the sternal area when asked which area was it hurting. Patient did not have any shortness of breath nausea vomiting cough palpitation dizziness or loss of consciousness. In the ER patient had EKG and cardiac enzymes which were negative. Since patient's d-dimer was high CT angiogram of the chest was done which was showing a filling defect which possibly is pulmonary embolism. Given the clinical history and lab work this most likely is pulmonary embolism. At this time patient has been started on xarelto. Patient be admitted for further observation. Patient is chest pain-free.   HOSPITAL COURSE: *Pulmonary embolism  - pt is clinically improving  - please note the findings on CTA noted above  - pt is maintaining good oxygen saturations and is not in hemodynamic stress  - will continue Rivaroxaban 15 mg BID for 21 days and then 20 mg PO daily  - follow up on CBC, please note that no active signs of bleeding noted and Hg/Hct  are within pt's baseline and stable  - 2 D ECHO showsLeft ventricle: The cavity size was normal. Wall thickness was increased in a pattern of mild LVH. There was mild focal basal hypertrophy of the septum. Systolic function was normal. The estimated ejection fraction was in the range of 55% to 60%. Regional wall motion abnormalities cannot be excluded. Doppler parameters are consistent with abnormal left ventricular relaxation (grade 1 diastolic dysfunction   Active Problems:  HTN (hypertension)  - remains stable, continue home medications  Hyperlipidemia  - will continue statin as noted above  Dementia  - stable  EDUCATION  - test results and diagnostic studies were discussed with patient and daughter at bedside  - patient verbalized the understanding  - questions were answered at the bedside and contact information was provided for additional questions or concerns  - PT evaluation, will need HHRN,HHPT       Discharge Exam:  Blood pressure 115/72, pulse 65, temperature 97.6 F (36.4 C), temperature source Oral, resp. rate 18, height 5\' 4"  (1.626 m), weight 76 kg (167 lb 8.8 oz), SpO2 92.00%.  General: Alert, awake, oriented to name only, in no acute distress.  HEENT: No bruits, no goiter. Moist mucous membranes, no scleral icterus, no conjunctival pallor.  Heart: Regular rate and rhythm, S1/S2 +, no murmurs, rubs, gallops.  Lungs: Clear to auscultation bilaterally. No wheezing, no rhonchi, no rales.  Abdomen: Soft, nontender, nondistended, positive bowel sounds.  Extremities: No clubbing or cyanosis, no pitting edema, positive pedal pulses.  Neuro: Grossly nonfocal.    Discharge Orders    Future Orders Please Complete By Expires   Diet - low sodium heart healthy      Increase activity slowly         Follow-up Information    Follow up withPCP in 1 week, cbc in one week         Signed: [facility] 05/14/2011, 12:52 PM

## 2011-05-14 NOTE — Progress Notes (Signed)
Pt discharged back to [facility]. Daughter received paperwork and discharge instructions with no further questions. They will transport her back to [facility]. Nursing home paperwork given to daughter in [place] envelope, containing xarelto prescription. Pt has no further concerns/complaints. IV DC'd. Duwaine Maxin, RN

## 2011-05-14 NOTE — Evaluation (Signed)
Physical Therapy Evaluation Patient Details Name: Pamela Ramirez MRN: [id number] DOB: 29-Nov-1921 Today's Date: 05/14/2011  Problem List:  Patient Active Problem List  Diagnoses  . Pulmonary embolism  . HTN (hypertension)  . Hyperlipidemia  . Dementia    Past Medical History:  Past Medical History  Diagnosis Date  . Hypertension   . Hypercholesterolemia   . Alzheimer disease   . Osteoporosis   . Tendonitis   . Vitamin d deficiency    Past Surgical History: History reviewed. No pertinent past surgical history.  PT Assessment/Plan/Recommendation PT Assessment Clinical Impression Statement: Pt demonstrates what appears to be her baseline level of function according to her family.  D/c plan to return to memory care unit at ALF seems appropriate.  May benefit from HHPT follow-up to address pt's fatigue/activity tolerance. Will have pt followed by mobility team while in acute setting.  PT Recommendation/Assessment: Patient will need skilled PT in the acute care venue PT Problem List: Decreased strength;Decreased activity tolerance;Decreased balance;Decreased cognition Barriers to Discharge: None PT Therapy Diagnosis : Generalized weakness PT Plan PT Frequency: Min 2X/week PT Treatment/Interventions: Gait training;Functional mobility training;Therapeutic exercise;Therapeutic activities;Balance training;Patient/family education;DME instruction PT Recommendation Follow Up Recommendations: Home health PT;Supervision for mobility/OOB (ALF) Equipment Recommended: None recommended by PT PT Goals  Acute Rehab PT Goals PT Goal Formulation: With patient/family Time For Goal Achievement: 7 days Pt will go Supine/Side to Sit: with modified independence;with HOB 0 degrees PT Goal: Supine/Side to Sit - Progress: Goal set today Pt will go Sit to Supine/Side: with HOB 0 degrees;Independently PT Goal: Sit to Supine/Side - Progress: Goal set today Pt will Ambulate: 51 - 150 feet;with  supervision;with least restrictive assistive device PT Goal: Ambulate - Progress: Goal set today  PT Evaluation Precautions/Restrictions  Precautions Precautions: Fall Restrictions Weight Bearing Restrictions: No Prior Functioning  Home Living Lives With: Alone Receives Help From: Other (Comment) (ALF staff) Type of Home: Assisted living Home Layout: One level Home Access: Level entry Bathroom Shower/Tub: Walk-in shower;Door [employer] Toilet: Standard Bathroom Accessibility: Yes How Accessible: Accessible via walker;Accessible via wheelchair Home Adaptive Equipment: Grab bars around toilet;Grab bars in shower;Hand-held shower hose Prior Function Level of Independence: Independent with basic ADLs;Independent with gait;Independent with transfers;Requires assistive device for independence Able to Take Stairs?: No Driving: No Vocation: Retired Leisure: Hobbies-no Cognition Cognition Arousal/Alertness: Lethargic Overall Cognitive Status: Appears within functional limits for tasks assessed Orientation Level: Oriented to person;Disoriented to place;Disoriented to time Sensation/Coordination Sensation Light Touch: Appears Intact Stereognosis: Not tested Hot/Cold: Not tested Proprioception: Appears Intact Coordination Gross Motor Movements are Fluid and Coordinated: Yes Fine Motor Movements are Fluid and Coordinated: Not tested Extremity Assessment RUE Assessment RUE Assessment: Within Functional Limits LUE Assessment LUE Assessment: Within Functional Limits RLE Assessment RLE Assessment: Within Functional Limits LLE Assessment LLE Assessment: Within Functional Limits Mobility (including Balance) Bed Mobility Bed Mobility: Yes Sit to Supine: 5: Supervision;HOB flat Sit to Supine - Details (indicate cue type and reason): Verbal cues to for safe technique initially pt falling back into supine but able to perform transition safely once cued not to fall back.    Transfers Transfers: Yes Sit to Stand: 5: Supervision;With armrests;From chair/3-in-1;With upper extremity assist Sit to Stand Details (indicate cue type and reason): verbal and tactile cues to place hands on armrest. HHA in standing for balance.  Stand to Sit: 5: Supervision;To bed Stand to Sit Details: Cues for hand placement and controlled descent.  Ambulation/Gait Ambulation/Gait: Yes Ambulation/Gait Assistance: 5: Supervision Ambulation/Gait Assistance Details (indicate cue type  and reason): HHA (min guard Assist) to ambulate without cane.  Pt gait is slow and unsteady but no significant loss of balance.  Ambulation Distance (Feet): 80 Feet Assistive device: 1 person hand held assist Gait Pattern: Shuffle Stairs: No Wheelchair Mobility Wheelchair Mobility: No  Posture/Postural Control Posture/Postural Control: No significant limitations Balance Balance Assessed: Yes Static Sitting Balance Static Sitting - Balance Support: No upper extremity supported;Feet supported Static Sitting - Level of Assistance: 6: Modified independent (Device/Increase time) Static Sitting - Comment/# of Minutes: several minutes on EOB with no loss of balance.   Exercise    End of Session PT - End of Session Equipment Utilized During Treatment: Gait belt Activity Tolerance: Patient limited by fatigue Patient left: in bed;with call bell in reach;with family/visitor present Nurse Communication: Mobility status for transfers;Mobility status for ambulation General Behavior During Session: [facility] for tasks performed Cognition: Impaired, at baseline  Jonael Paradiso 05/14/2011, 1:37 PM Seryna Marek L. Laportia Carley DPT [phone]

## 2013-11-30 ENCOUNTER — Encounter (HOSPITAL_COMMUNITY): Payer: Self-pay | Admitting: Emergency Medicine

## 2013-11-30 ENCOUNTER — Emergency Department (HOSPITAL_COMMUNITY)
Admission: EM | Admit: 2013-11-30 | Discharge: 2013-11-30 | Disposition: A | Discharge status: Home / Self Care | Payer: Medicare Other | Attending: Emergency Medicine | Admitting: Emergency Medicine

## 2013-11-30 ENCOUNTER — Emergency Department (HOSPITAL_COMMUNITY): Payer: Medicare Other

## 2013-11-30 DIAGNOSIS — I10 Essential (primary) hypertension: Secondary | ICD-10-CM | POA: Diagnosis not present

## 2013-11-30 DIAGNOSIS — F0280 Dementia in other diseases classified elsewhere without behavioral disturbance: Secondary | ICD-10-CM | POA: Diagnosis not present

## 2013-11-30 DIAGNOSIS — Z79899 Other long term (current) drug therapy: Secondary | ICD-10-CM | POA: Diagnosis not present

## 2013-11-30 DIAGNOSIS — Z8739 Personal history of other diseases of the musculoskeletal system and connective tissue: Secondary | ICD-10-CM | POA: Insufficient documentation

## 2013-11-30 DIAGNOSIS — E7800 Pure hypercholesterolemia, unspecified: Secondary | ICD-10-CM | POA: Diagnosis not present

## 2013-11-30 DIAGNOSIS — Y929 Unspecified place or not applicable: Secondary | ICD-10-CM | POA: Diagnosis not present

## 2013-11-30 DIAGNOSIS — F0391 Unspecified dementia with behavioral disturbance: Secondary | ICD-10-CM

## 2013-11-30 DIAGNOSIS — G309 Alzheimer's disease, unspecified: Secondary | ICD-10-CM | POA: Diagnosis not present

## 2013-11-30 DIAGNOSIS — Z043 Encounter for examination and observation following other accident: Secondary | ICD-10-CM | POA: Diagnosis present

## 2013-11-30 DIAGNOSIS — W06XXXA Fall from bed, initial encounter: Secondary | ICD-10-CM | POA: Diagnosis not present

## 2013-11-30 DIAGNOSIS — Z7902 Long term (current) use of antithrombotics/antiplatelets: Secondary | ICD-10-CM | POA: Insufficient documentation

## 2013-11-30 DIAGNOSIS — Y9389 Activity, other specified: Secondary | ICD-10-CM | POA: Diagnosis not present

## 2013-11-30 NOTE — ED Notes (Signed)
Bed: WA22 Expected date:  Expected time:  Means of arrival:  Comments: EMS-fall 

## 2013-11-30 NOTE — ED Notes (Addendum)
Per EMS pt from Pamela Ramirez brought in for unwitnessed fall, pt appears to have fallen from bed, found lying supine, pt has baseline dementia, refused C-collar. Pt displays signs of hip pain during movement. Pt at baseline mental status of dementia, aggressive. Pt takes xarelto

## 2013-11-30 NOTE — Discharge Instructions (Signed)
Given your recent fall, and fall risk, as well as dementia - contact patients doctor today to discuss whether or not they want to continue the blood thinner/xarelto. Return to ER if worse, new symptoms, severe pain, fevers, other concern.      Fall Prevention and Home Safety Falls cause injuries and can affect all age groups. It is possible to use preventive measures to significantly decrease the likelihood of falls. There are many simple measures which can make your home safer and prevent falls. OUTDOORS  Repair cracks and edges of walkways and driveways.  Remove high doorway thresholds.  Trim shrubbery on the main path into your home.  Have good outside lighting.  Clear walkways of tools, rocks, debris, and clutter.  Check that handrails are not broken and are securely fastened. Both sides of steps should have handrails.  Have leaves, snow, and ice cleared regularly.  Use sand or salt on walkways during winter months.  In the garage, clean up grease or oil spills. BATHROOM  Install night lights.  Install grab bars by the toilet and in the tub and shower.  Use non-skid mats or decals in the tub or shower.  Place a plastic non-slip stool in the shower to sit on, if needed.  Keep floors dry and clean up all water on the floor immediately.  Remove soap buildup in the tub or shower on a regular basis.  Secure bath mats with non-slip, double-sided rug tape.  Remove throw rugs and tripping hazards from the floors. BEDROOMS  Install night lights.  Make sure a bedside light is easy to reach.  Do not use oversized bedding.  Keep a telephone by your bedside.  Have a firm chair with side arms to use for getting dressed.  Remove throw rugs and tripping hazards from the floor. KITCHEN  Keep handles on pots and pans turned toward the center of the stove. Use back burners when possible.  Clean up spills quickly and allow time for drying.  Avoid walking on wet  floors.  Avoid hot utensils and knives.  Position shelves so they are not too high or low.  Place commonly used objects within easy reach.  If necessary, use a sturdy step stool with a grab bar when reaching.  Keep electrical cables out of the way.  Do not use floor polish or wax that makes floors slippery. If you must use wax, use non-skid floor wax.  Remove throw rugs and tripping hazards from the floor. STAIRWAYS  Never leave objects on stairs.  Place handrails on both sides of stairways and use them. Fix any loose handrails. Make sure handrails on both sides of the stairways are as long as the stairs.  Check carpeting to make sure it is firmly attached along stairs. Make repairs to worn or loose carpet promptly.  Avoid placing throw rugs at the top or bottom of stairways, or properly secure the rug with carpet tape to prevent slippage. Get rid of throw rugs, if possible.  Have an electrician put in a light switch at the top and bottom of the stairs. OTHER FALL PREVENTION TIPS  Wear low-heel or rubber-soled shoes that are supportive and fit well. Wear closed toe shoes.  When using a stepladder, make sure it is fully opened and both spreaders are firmly locked. Do not climb a closed stepladder.  Add color or contrast paint or tape to grab bars and handrails in your home. Place contrasting color strips on first and last steps.  Learn  and use mobility aids as needed. Install an electrical emergency response system.  Turn on lights to avoid dark areas. Replace light bulbs that burn out immediately. Get light switches that glow.  Arrange furniture to create clear pathways. Keep furniture in the same place.  Firmly attach carpet with non-skid or double-sided tape.  Eliminate uneven floor surfaces.  Select a carpet pattern that does not visually hide the edge of steps.  Be aware of all pets. OTHER HOME SAFETY TIPS  Set the water temperature for 120 F (48.8 C).  Keep  emergency numbers on or near the telephone.  Keep smoke detectors on every level of the home and near sleeping areas. Document Released: 02/19/2002 Document Revised: 08/31/2011 Document Reviewed: 05/21/2011 [facility] Patient Information 2015 [place], [place]. This information is not intended to replace advice given to you by your health care provider. Make sure you discuss any questions you have with your health care provider.

## 2013-11-30 NOTE — ED Provider Notes (Signed)
CSN: [id number]     Arrival date & time 11/30/13  4098 History   First MD Initiated Contact with Patient 11/30/13 1009     Chief Complaint  Patient presents with  . Fall     (Consider location/radiation/quality/duration/timing/severity/associated sxs/prior Treatment) Patient is a 80 y.o. female presenting with fall. The history is provided by the patient and the EMS personnel. The history is limited by the condition of the patient.  Fall  pt with hx advanced dementia, from ecf, s/p fall from bed to floor this morning. Pt w advanced dementia, not responsive to questions - level 5 caveat.  pts mental status is described as being c/w baseline.  Pt without apparent focal pain or discomfort. Is on xarelto.    Past Medical History  Diagnosis Date  . Hypertension   . Hypercholesterolemia   . Osteoporosis   . Tendonitis   . Vitamin D deficiency   . Alzheimer disease    History reviewed. No pertinent past surgical history. History reviewed. No pertinent family history. History  Substance Use Topics  . Smoking status: Never Smoker   . Smokeless tobacco: Not on file  . Alcohol Use: No   OB History   Grav Para Term Preterm Abortions TAB SAB Ect Mult Living                 Review of Systems  Unable to perform ROS: Dementia  level 5 caveat, pt unresponsive to questioning.     Allergies  Review of patient's allergies indicates no known allergies.  Home Medications   Prior to Admission medications   Medication Sig Start Date End Date Taking? Authorizing Provider  acetaminophen (TYLENOL) 650 MG CR tablet Take 650 mg by mouth 3 (three) times daily.   Yes Historical Provider, MD  ALPRAZolam (XANAX) 0.25 MG tablet Take 0.25 mg by mouth 2 (two) times daily.   Yes Historical Provider, MD  atorvastatin (LIPITOR) 20 MG tablet Take 20 mg by mouth daily.   Yes Historical Provider, MD  divalproex (DEPAKOTE) 500 MG DR tablet Take 500 mg by mouth 2 (two) times daily.   Yes Historical  Provider, MD  glipiZIDE (GLUCOTROL) 5 MG tablet Take 5 mg by mouth daily before breakfast.   Yes Historical Provider, MD  lisinopril (PRINIVIL,ZESTRIL) 10 MG tablet Take 10 mg by mouth daily.   Yes Historical Provider, MD  Multiple Vitamins-Minerals (MULTIVITAMINS THER. W/MINERALS) TABS Take 1 tablet by mouth daily.   Yes Historical Provider, MD  OLANZapine (ZYPREXA) 2.5 MG tablet Take 2.5 mg by mouth at bedtime.   Yes Historical Provider, MD  rivaroxaban (XARELTO) 10 MG TABS tablet Take 10 mg by mouth at bedtime.   Yes Historical Provider, MD  Vitamins-Lipotropics (LIPO-FLAVONOID PLUS PO) Take 1 tablet by mouth daily.   Yes Historical Provider, MD   BP 141/72  Pulse 60  Temp(Src) 97.6 F (36.4 C) (Oral)  Resp 18  SpO2 97% Physical Exam  Nursing note and vitals reviewed. Constitutional: She appears well-developed and well-nourished. No distress.  HENT:  Head: Atraumatic.  No scalp or facial sts or tenderness.   Eyes: Conjunctivae are normal. Pupils are equal, round, and reactive to light. No scleral icterus.  Neck: Normal range of motion. Neck supple. No tracheal deviation present.  Cardiovascular: Normal rate, regular rhythm, normal heart sounds and intact distal pulses.   Pulmonary/Chest: Effort normal and breath sounds normal. No respiratory distress. She exhibits no tenderness.  Abdominal: Soft. Normal appearance and bowel sounds are normal. She exhibits  no distension. There is no tenderness.  Genitourinary:  No cva or flank tenderness.   Musculoskeletal: Normal range of motion. She exhibits no edema.  ?tenderness over bony pelvis. CTLS spine, non tender, aligned, no step off. Good rom bil ext without apparent pain. No rotational deformity or shortening. Distal pulses palp bil.   Neurological: She is alert.  Alert, becomes agitated/angry w moving about on stretcher. V confused. Unresponsive to questions asked. Moves bil ext purposefully w good strength.    Skin: Skin is warm and  dry. No rash noted.  Psychiatric:  Content, alert. Agitated w prodding/movement.     ED Course  Procedures (including critical care time) Labs Review  Dg Pelvis 1-2 Views  11/30/2013   CLINICAL DATA:  Recent fall with pelvic pain  EXAM: PELVIS - 1-2 VIEW  COMPARISON:  None.  FINDINGS: Diffuse degenerative changes of the hip joints are noted bilaterally. Pelvic ring is intact. No gross soft tissue abnormality is seen.  IMPRESSION: Chronic changes without acute abnormality.   Electronically Signed   By: Alcide Clever M.D.   On: 11/30/2013 11:10   Ct Head Wo Contrast  11/30/2013   CLINICAL DATA:  Fall, dementia, no mental status change  EXAM: CT HEAD WITHOUT CONTRAST  TECHNIQUE: Contiguous axial images were obtained from the base of the skull through the vertex without intravenous contrast.  COMPARISON:  07/23/2005  FINDINGS: There is no evidence of mass effect, midline shift, or extra-axial fluid collections. There is no evidence of a space-occupying lesion or intracranial hemorrhage. There is no evidence of a cortical-based area of acute infarction. There is generalized cerebral atrophy. There is periventricular white matter low attenuation likely secondary to microangiopathy.  There is mild ventriculomegaly with the bifrontal span measuring 4.4 cm. The basal cisterns are patent.  Visualized portions of the orbits are unremarkable. Hypoplastic right frontal sinus with mucosal thickening. Cerebrovascular atherosclerotic calcifications are noted.  The osseous structures are unremarkable.  IMPRESSION: 1. No acute intracranial pathology. 2. Mild ventriculomegaly which may be secondary to generalized cerebral atrophy. Normal pressure hydrocephalus cannot be excluded.   Electronically Signed   By: Elige Ko   On: 11/30/2013 11:10     MDM   Ct.  Reviewed nursing notes and prior charts for additional history.   Recheck spine nt.   No apparent pain or focal bony tenderness on recheck.  Pt appears  stable for d/c.    Suzi Roots, MD 11/30/13 1149

## 2014-06-10 ENCOUNTER — Emergency Department (HOSPITAL_COMMUNITY): Payer: Medicare Other

## 2014-06-10 ENCOUNTER — Encounter (HOSPITAL_COMMUNITY): Payer: Self-pay | Admitting: Emergency Medicine

## 2014-06-10 ENCOUNTER — Emergency Department (HOSPITAL_COMMUNITY)
Admission: EM | Admit: 2014-06-10 | Discharge: 2014-06-10 | Disposition: A | Discharge status: Home / Self Care | Payer: Medicare Other | Attending: Emergency Medicine | Admitting: Emergency Medicine

## 2014-06-10 DIAGNOSIS — E780 Pure hypercholesterolemia: Secondary | ICD-10-CM | POA: Diagnosis not present

## 2014-06-10 DIAGNOSIS — Z8739 Personal history of other diseases of the musculoskeletal system and connective tissue: Secondary | ICD-10-CM | POA: Diagnosis not present

## 2014-06-10 DIAGNOSIS — R531 Weakness: Secondary | ICD-10-CM | POA: Insufficient documentation

## 2014-06-10 DIAGNOSIS — Z79899 Other long term (current) drug therapy: Secondary | ICD-10-CM | POA: Diagnosis not present

## 2014-06-10 DIAGNOSIS — R55 Syncope and collapse: Secondary | ICD-10-CM | POA: Diagnosis present

## 2014-06-10 DIAGNOSIS — F0280 Dementia in other diseases classified elsewhere without behavioral disturbance: Secondary | ICD-10-CM | POA: Diagnosis not present

## 2014-06-10 DIAGNOSIS — Z7901 Long term (current) use of anticoagulants: Secondary | ICD-10-CM | POA: Insufficient documentation

## 2014-06-10 DIAGNOSIS — I10 Essential (primary) hypertension: Secondary | ICD-10-CM | POA: Insufficient documentation

## 2014-06-10 DIAGNOSIS — G309 Alzheimer's disease, unspecified: Secondary | ICD-10-CM | POA: Insufficient documentation

## 2014-06-10 LAB — URINALYSIS, ROUTINE W REFLEX MICROSCOPIC
BILIRUBIN URINE: NEGATIVE
Glucose, UA: NEGATIVE mg/dL
Hgb urine dipstick: NEGATIVE
Ketones, ur: NEGATIVE mg/dL
LEUKOCYTES UA: NEGATIVE
Nitrite: NEGATIVE
PH: 6 (ref 5.0–8.0)
Protein, ur: NEGATIVE mg/dL
SPECIFIC GRAVITY, URINE: 1.023 (ref 1.005–1.030)
Urobilinogen, UA: 1 mg/dL (ref 0.0–1.0)

## 2014-06-10 LAB — I-STAT CHEM 8, ED
BUN: 30 mg/dL — AB (ref 6–23)
CALCIUM ION: 1.05 mmol/L — AB (ref 1.13–1.30)
CHLORIDE: 107 mmol/L (ref 96–112)
Creatinine, Ser: 1.1 mg/dL (ref 0.50–1.10)
GLUCOSE: 94 mg/dL (ref 70–99)
HCT: 40 % (ref 36.0–46.0)
Hemoglobin: 13.6 g/dL (ref 12.0–15.0)
Potassium: 4.9 mmol/L (ref 3.5–5.1)
Sodium: 139 mmol/L (ref 135–145)
TCO2: 18 mmol/L (ref 0–100)

## 2014-06-10 NOTE — ED Notes (Signed)
PTAR called.Marland Kitchen.klj

## 2014-06-10 NOTE — ED Notes (Signed)
1st attempt to stick pt in R hand unsuccessful.  Pt is combative and uncooperative.

## 2014-06-10 NOTE — ED Notes (Signed)
Bed: [id number] Expected date:  Expected time:  Means of arrival:  Comments: Ems- elderly, altered?

## 2014-06-10 NOTE — Discharge Instructions (Signed)
Follow up with your md later this week for recheck °

## 2014-06-10 NOTE — ED Notes (Signed)
[facility] Staff took patient to bathroom and when returned, patient was leaned up against the wall, seated on the toilet, hard to arouse. Per EMS, stated that patient is at baseline except she is usually a bit more active.

## 2014-06-11 NOTE — ED Provider Notes (Signed)
CSN: [id number]     Arrival date & time 06/10/14  1322 History   First MD Initiated Contact with Patient 06/10/14 1353     Chief Complaint  Patient presents with  . Altered Mental Status     (Consider location/radiation/quality/duration/timing/severity/associated sxs/prior Treatment) Patient is a 81 y.o. female presenting with near-syncope. The history is provided by the nursing home (pt was found in the bathroom sitting and sleepy.  pt back to normal now).  Near Syncope This is a new problem. The current episode started 1 to 2 hours ago. The problem occurs rarely. The problem has been resolved. Pertinent negatives include no chest pain and no abdominal pain. Nothing aggravates the symptoms. Nothing relieves the symptoms.    Past Medical History  Diagnosis Date  . Hypertension   . Hypercholesterolemia   . Osteoporosis   . Tendonitis   . Vitamin D deficiency   . Alzheimer disease    History reviewed. No pertinent past surgical history. History reviewed. No pertinent family history. History  Substance Use Topics  . Smoking status: Never Smoker   . Smokeless tobacco: Not on file  . Alcohol Use: No   OB History    No data available     Review of Systems  Unable to perform ROS: Dementia  Cardiovascular: Positive for near-syncope. Negative for chest pain.  Gastrointestinal: Negative for abdominal pain.      Allergies  Review of patient's allergies indicates no known allergies.  Home Medications   Prior to Admission medications   Medication Sig Start Date End Date Taking? Authorizing Provider  acetaminophen (TYLENOL) 650 MG CR tablet Take 650 mg by mouth 3 (three) times daily.   Yes Historical Provider, MD  ALPRAZolam (XANAX) 0.25 MG tablet Take 0.25 mg by mouth 2 (two) times daily.   Yes Historical Provider, MD  atorvastatin (LIPITOR) 20 MG tablet Take 20 mg by mouth daily.   Yes Historical Provider, MD  divalproex (DEPAKOTE ER) 250 MG 24 hr tablet Take 250 mg by mouth  2 (two) times daily.   Yes Historical Provider, MD  glipiZIDE (GLUCOTROL) 5 MG tablet Take 5 mg by mouth daily before breakfast.   Yes Historical Provider, MD  lisinopril (PRINIVIL,ZESTRIL) 10 MG tablet Take 10 mg by mouth daily.   Yes Historical Provider, MD  Multiple Vitamins-Minerals (MULTIVITAMINS THER. W/MINERALS) TABS Take 1 tablet by mouth daily.   Yes Historical Provider, MD  Nutritional Supplements (NUTRITIONAL DRINK) LIQD Take 1 Bottle by mouth 2 (two) times daily.   Yes Historical Provider, MD  rivaroxaban (XARELTO) 10 MG TABS tablet Take 10 mg by mouth at bedtime.   Yes Historical Provider, MD  Vitamins-Lipotropics (LIPO-FLAVONOID PLUS PO) Take 1 tablet by mouth daily.   Yes Historical Provider, MD   BP 109/64 mmHg  Pulse 71  Temp(Src) 98.1 F (36.7 C) (Oral)  Resp 17  SpO2 97% Physical Exam  Constitutional: She appears well-developed.  HENT:  Head: Normocephalic.  Eyes: Conjunctivae and EOM are normal. No scleral icterus.  Neck: Neck supple. No thyromegaly present.  Cardiovascular: Normal rate and regular rhythm.  Exam reveals no gallop and no friction rub.   No murmur heard. Pulmonary/Chest: No stridor. She has no wheezes. She has no rales. She exhibits no tenderness.  Abdominal: She exhibits no distension. There is no tenderness. There is no rebound.  Musculoskeletal: Normal range of motion. She exhibits no edema.  Lymphadenopathy:    She has no cervical adenopathy.  Neurological: She exhibits normal muscle tone. Coordination  normal.  Oriented to person only  Skin: No rash noted. No erythema.  Psychiatric: She has a normal mood and affect. Her behavior is normal.    ED Course  Procedures (including critical care time) Labs Review Labs Reviewed  I-STAT CHEM 8, ED - Abnormal; Notable for the following:    BUN 30 (*)    Calcium, Ion 1.05 (*)    All other components within normal limits  URINALYSIS, ROUTINE W REFLEX MICROSCOPIC    Imaging Review Dg Chest Port 1  View  06/10/2014   CLINICAL DATA:  Acute altered mental status with syncope, weakness and hypotension.  EXAM: PORTABLE CHEST - 1 VIEW  COMPARISON:  05/11/2011 chest radiograph  FINDINGS: The cardiomediastinal silhouette is unremarkable.  Minimal atelectasis in the lower right lung noted.  There is no evidence of focal airspace disease, pulmonary edema, suspicious pulmonary nodule/mass, pleural effusion, or pneumothorax. No acute bony abnormalities are identified.  IMPRESSION: Minimal right lower lung atelectasis.   Electronically Signed   By: Harmon Pier M.D.   On: 06/10/2014 14:26     EKG Interpretation   Date/Time:  Monday June 10 2014 14:14:51 EDT Ventricular Rate:  68 PR Interval:    QRS Duration: 116 QT Interval:  423 QTC Calculation: 450 R Axis:   -25 Text Interpretation:  A-V dual-paced complexes, A-noncapt due to AF No  further rhythm analysis attempted due to paced rhythm Incomplete RBBB and  LAFB Borderline low voltage, extremity leads Confirmed by Jovahn Breit  MD,  Emie Sommerfeld [phone]) on 06/11/2014 7:20:06 AM      MDM   Final diagnoses:  Weak  Near syncope    Near syncope,  Nl studies,  Pt to go back to Tomie China, MD 06/11/14 [phone]

## 2014-07-11 ENCOUNTER — Emergency Department (HOSPITAL_COMMUNITY): Payer: Medicare Other

## 2014-07-11 ENCOUNTER — Emergency Department (HOSPITAL_COMMUNITY)
Admission: EM | Admit: 2014-07-11 | Discharge: 2014-07-11 | Disposition: A | Discharge status: Home / Self Care | Payer: Medicare Other | Attending: Emergency Medicine | Admitting: Emergency Medicine

## 2014-07-11 ENCOUNTER — Encounter (HOSPITAL_COMMUNITY): Payer: Self-pay | Admitting: Emergency Medicine

## 2014-07-11 DIAGNOSIS — Y92129 Unspecified place in nursing home as the place of occurrence of the external cause: Secondary | ICD-10-CM | POA: Insufficient documentation

## 2014-07-11 DIAGNOSIS — Z8669 Personal history of other diseases of the nervous system and sense organs: Secondary | ICD-10-CM | POA: Diagnosis not present

## 2014-07-11 DIAGNOSIS — I10 Essential (primary) hypertension: Secondary | ICD-10-CM | POA: Insufficient documentation

## 2014-07-11 DIAGNOSIS — N390 Urinary tract infection, site not specified: Secondary | ICD-10-CM | POA: Insufficient documentation

## 2014-07-11 DIAGNOSIS — S0990XA Unspecified injury of head, initial encounter: Secondary | ICD-10-CM | POA: Diagnosis present

## 2014-07-11 DIAGNOSIS — Y939 Activity, unspecified: Secondary | ICD-10-CM | POA: Insufficient documentation

## 2014-07-11 DIAGNOSIS — E780 Pure hypercholesterolemia: Secondary | ICD-10-CM | POA: Diagnosis not present

## 2014-07-11 DIAGNOSIS — F0390 Unspecified dementia without behavioral disturbance: Secondary | ICD-10-CM | POA: Insufficient documentation

## 2014-07-11 DIAGNOSIS — Y999 Unspecified external cause status: Secondary | ICD-10-CM | POA: Diagnosis not present

## 2014-07-11 DIAGNOSIS — S0093XA Contusion of unspecified part of head, initial encounter: Secondary | ICD-10-CM

## 2014-07-11 DIAGNOSIS — S0083XA Contusion of other part of head, initial encounter: Secondary | ICD-10-CM | POA: Diagnosis not present

## 2014-07-11 DIAGNOSIS — Z79899 Other long term (current) drug therapy: Secondary | ICD-10-CM | POA: Insufficient documentation

## 2014-07-11 DIAGNOSIS — W1830XA Fall on same level, unspecified, initial encounter: Secondary | ICD-10-CM | POA: Diagnosis not present

## 2014-07-11 DIAGNOSIS — Z7901 Long term (current) use of anticoagulants: Secondary | ICD-10-CM | POA: Insufficient documentation

## 2014-07-11 DIAGNOSIS — Z8739 Personal history of other diseases of the musculoskeletal system and connective tissue: Secondary | ICD-10-CM | POA: Diagnosis not present

## 2014-07-11 DIAGNOSIS — S80211A Abrasion, right knee, initial encounter: Secondary | ICD-10-CM | POA: Insufficient documentation

## 2014-07-11 DIAGNOSIS — W19XXXA Unspecified fall, initial encounter: Secondary | ICD-10-CM

## 2014-07-11 LAB — CBC WITH DIFFERENTIAL/PLATELET
BASOS ABS: 0 10*3/uL (ref 0.0–0.1)
Basophils Relative: 0 % (ref 0–1)
Eosinophils Absolute: 0.1 10*3/uL (ref 0.0–0.7)
Eosinophils Relative: 2 % (ref 0–5)
HEMATOCRIT: 35.2 % — AB (ref 36.0–46.0)
HEMOGLOBIN: 11.7 g/dL — AB (ref 12.0–15.0)
LYMPHS PCT: 54 % — AB (ref 12–46)
Lymphs Abs: 2.6 10*3/uL (ref 0.7–4.0)
MCH: 32 pg (ref 26.0–34.0)
MCHC: 33.2 g/dL (ref 30.0–36.0)
MCV: 96.2 fL (ref 78.0–100.0)
MONO ABS: 0.5 10*3/uL (ref 0.1–1.0)
Monocytes Relative: 10 % (ref 3–12)
NEUTROS ABS: 1.6 10*3/uL — AB (ref 1.7–7.7)
Neutrophils Relative %: 34 % — ABNORMAL LOW (ref 43–77)
Platelets: 251 10*3/uL (ref 150–400)
RBC: 3.66 MIL/uL — ABNORMAL LOW (ref 3.87–5.11)
RDW: 13.3 % (ref 11.5–15.5)
WBC: 4.8 10*3/uL (ref 4.0–10.5)

## 2014-07-11 LAB — URINALYSIS, ROUTINE W REFLEX MICROSCOPIC
BILIRUBIN URINE: NEGATIVE
GLUCOSE, UA: NEGATIVE mg/dL
Hgb urine dipstick: NEGATIVE
KETONES UR: NEGATIVE mg/dL
Nitrite: POSITIVE — AB
PH: 5.5 (ref 5.0–8.0)
PROTEIN: NEGATIVE mg/dL
Specific Gravity, Urine: 1.016 (ref 1.005–1.030)
Urobilinogen, UA: 0.2 mg/dL (ref 0.0–1.0)

## 2014-07-11 LAB — COMPREHENSIVE METABOLIC PANEL
ALT: 10 U/L (ref 0–35)
AST: 15 U/L (ref 0–37)
Albumin: 3.7 g/dL (ref 3.5–5.2)
Alkaline Phosphatase: 46 U/L (ref 39–117)
Anion gap: 9 (ref 5–15)
BILIRUBIN TOTAL: 0.7 mg/dL (ref 0.3–1.2)
BUN: 21 mg/dL (ref 6–23)
CHLORIDE: 104 mmol/L (ref 96–112)
CO2: 28 mmol/L (ref 19–32)
Calcium: 10 mg/dL (ref 8.4–10.5)
Creatinine, Ser: 1.24 mg/dL — ABNORMAL HIGH (ref 0.50–1.10)
GFR calc non Af Amer: 37 mL/min — ABNORMAL LOW (ref >90)
GFR, EST AFRICAN AMERICAN: 43 mL/min — AB (ref >90)
Glucose, Bld: 89 mg/dL (ref 70–99)
Potassium: 4.8 mmol/L (ref 3.5–5.1)
SODIUM: 141 mmol/L (ref 135–145)
TOTAL PROTEIN: 6.5 g/dL (ref 6.0–8.3)

## 2014-07-11 LAB — I-STAT TROPONIN, ED: TROPONIN I, POC: 0.01 ng/mL (ref 0.00–0.08)

## 2014-07-11 LAB — URINE MICROSCOPIC-ADD ON

## 2014-07-11 MED ORDER — CEPHALEXIN 500 MG PO CAPS: 500.0000 mg | ORAL_CAPSULE | Freq: Two times a day (BID) | ORAL | Status: DC

## 2014-07-11 MED ORDER — SODIUM CHLORIDE 0.9 % IV BOLUS (SEPSIS)
500.0000 mL | Freq: Once | INTRAVENOUS | Status: AC
  Administered 2014-07-11: 500 mL via INTRAVENOUS

## 2014-07-11 MED ORDER — DEXTROSE 5 % IV SOLN
1.0000 g | Freq: Once | INTRAVENOUS | Status: AC
  Administered 2014-07-11: 1 g via INTRAVENOUS
  Filled 2014-07-11: qty 10

## 2014-07-11 NOTE — ED Notes (Signed)
Pt. Able to ambulate with standby assist in the hallway.

## 2014-07-11 NOTE — Discharge Instructions (Signed)
[name] has a urinary tract infection.  Please have her see her family doctor in the next week for a recheck and to recheck her kidney function, which appears to be slightly worse than her previous study.       Head Injury You have received a head injury. It does not appear serious at this time. Headaches and vomiting are common following head injury. It should be easy to awaken from sleeping. Sometimes it is necessary for you to stay in the emergency department for a while for observation. Sometimes admission to the hospital may be needed. After injuries such as yours, most problems occur within the first 24 hours, but side effects may occur up to 7-10 days after the injury. It is important for you to carefully monitor your condition and contact your health care provider or seek immediate medical care if there is a change in your condition. WHAT ARE THE TYPES OF HEAD INJURIES? Head injuries can be as minor as a bump. Some head injuries can be more severe. More severe head injuries include:  A jarring injury to the brain (concussion).  A bruise of the brain (contusion). This mean there is bleeding in the brain that can cause swelling.  A cracked skull (skull fracture).  Bleeding in the brain that collects, clots, and forms a bump (hematoma). WHAT CAUSES A HEAD INJURY? A serious head injury is most likely to happen to someone who is in a car wreck and is not wearing a seat belt. Other causes of major head injuries include bicycle or motorcycle accidents, sports injuries, and falls. HOW ARE HEAD INJURIES DIAGNOSED? A complete history of the event leading to the injury and your current symptoms will be helpful in diagnosing head injuries. Many times, pictures of the brain, such as CT or MRI are needed to see the extent of the injury. Often, an overnight hospital stay is necessary for observation.  WHEN SHOULD I SEEK IMMEDIATE MEDICAL CARE?  You should get help right away if:  You have confusion  or drowsiness.  You feel sick to your stomach (nauseous) or have continued, forceful vomiting.  You have dizziness or unsteadiness that is getting worse.  You have severe, continued headaches not relieved by medicine. Only take over-the-counter or prescription medicines for pain, fever, or discomfort as directed by your health care provider.  You do not have normal function of the arms or legs or are unable to walk.  You notice changes in the black spots in the center of the colored part of your eye (pupil).  You have a clear or bloody fluid coming from your nose or ears.  You have a loss of vision. During the next 24 hours after the injury, you must stay with someone who can watch you for the warning signs. This person should contact local emergency services (911 in the [place] if you have seizures, you become unconscious, or you are unable to wake up. HOW CAN I PREVENT A HEAD INJURY IN THE FUTURE? The most important factor for preventing major head injuries is avoiding motor vehicle accidents. To minimize the potential for damage to your head, it is crucial to wear seat belts while riding in motor vehicles. Wearing helmets while bike riding and playing collision sports (like football) is also helpful. Also, avoiding dangerous activities around the house will further help reduce your risk of head injury.  WHEN CAN I RETURN TO NORMAL ACTIVITIES AND ATHLETICS? You should be reevaluated by your health care provider before  returning to these activities. If you have any of the following symptoms, you should not return to activities or contact sports until 1 week after the symptoms have stopped:  Persistent headache.  Dizziness or vertigo.  Poor attention and concentration.  Confusion.  Memory problems.  Nausea or vomiting.  Fatigue or tire easily.  Irritability.  Intolerant of bright lights or loud noises.  Anxiety or depression.  Disturbed sleep. MAKE SURE YOU:   Understand  these instructions.  Will watch your condition.  Will get help right away if you are not doing well or get worse. Document Released: 03/01/2005 Document Revised: 03/06/2013 Document Reviewed: 11/06/2012 [facility] Patient Information 2015 [place], [place]. This information is not intended to replace advice given to you by your health care provider. Make sure you discuss any questions you have with your health care provider.  Urinary Tract Infection Urinary tract infections (UTIs) can develop anywhere along your urinary tract. Your urinary tract is your body's drainage system for removing wastes and extra water. Your urinary tract includes two kidneys, two ureters, a bladder, and a urethra. Your kidneys are a pair of bean-shaped organs. Each kidney is about the size of your fist. They are located below your ribs, one on each side of your spine. CAUSES Infections are caused by microbes, which are microscopic organisms, including fungi, viruses, and bacteria. These organisms are so small that they can only be seen through a microscope. Bacteria are the microbes that most commonly cause UTIs. SYMPTOMS  Symptoms of UTIs may vary by age and gender of the patient and by the location of the infection. Symptoms in young women typically include a frequent and intense urge to urinate and a painful, burning feeling in the bladder or urethra during urination. Older women and men are more likely to be tired, shaky, and weak and have muscle aches and abdominal pain. A fever may mean the infection is in your kidneys. Other symptoms of a kidney infection include pain in your back or sides below the ribs, nausea, and vomiting. DIAGNOSIS To diagnose a UTI, your caregiver will ask you about your symptoms. Your caregiver also will ask to provide a urine sample. The urine sample will be tested for bacteria and white blood cells. White blood cells are made by your body to help fight infection. TREATMENT  Typically, UTIs can  be treated with medication. Because most UTIs are caused by a bacterial infection, they usually can be treated with the use of antibiotics. The choice of antibiotic and length of treatment depend on your symptoms and the type of bacteria causing your infection. HOME CARE INSTRUCTIONS  If you were prescribed antibiotics, take them exactly as your caregiver instructs you. Finish the medication even if you feel better after you have only taken some of the medication.  Drink enough water and fluids to keep your urine clear or pale yellow.  Avoid caffeine, tea, and carbonated beverages. They tend to irritate your bladder.  Empty your bladder often. Avoid holding urine for long periods of time.  Empty your bladder before and after sexual intercourse.  After a bowel movement, women should cleanse from front to back. Use each tissue only once. SEEK MEDICAL CARE IF:   You have back pain.  You develop a fever.  Your symptoms do not begin to resolve within 3 days. SEEK IMMEDIATE MEDICAL CARE IF:   You have severe back pain or lower abdominal pain.  You develop chills.  You have nausea or vomiting.  You  have continued burning or discomfort with urination. MAKE SURE YOU:   Understand these instructions.  Will watch your condition.  Will get help right away if you are not doing well or get worse. Document Released: 12/09/2004 Document Revised: 08/31/2011 Document Reviewed: 04/09/2011 [facility] Patient Information 2015 [place], [place]. This information is not intended to replace advice given to you by your health care provider. Make sure you discuss any questions you have with your health care provider.

## 2014-07-11 NOTE — ED Notes (Signed)
Pt is from [facility], reported pt had a unwitnessed fall. Pt has a hematoma to her right forehead. Pt has a hx of dementia, per nursing facility pt is at her baseline. C-collar applied as a precaution. Nursing home staff found pt sitting on the floor in her room.

## 2014-07-11 NOTE — ED Notes (Signed)
Pt. Changed out of clothes from home with help of Sarah, NT. Pt. Refusing to have 12 L monitor on. Dr. Madilyn Hookees made aware.

## 2014-07-11 NOTE — ED Provider Notes (Signed)
CSN: [id number]     Arrival date & time 07/11/14  7829 History   First MD Initiated Contact with Patient 07/11/14 0703     Chief Complaint  Patient presents with  . Fall  . Head Injury     Patient is a 81 y.o. female presenting with fall and head injury. The history is provided by the nursing home. No language interpreter was used.  Fall  Head Injury  Pamela Ramirez presents for evaluation of injuries following a fall.  Level V caveat due to dementia. She takes Xarelto.  Per NH report she was found on the floor in her room at 0630 sitting indian-style behind the door.  She had an unwitnessed fall.  She was last seen at 0500.  She is mostly nonverbal but is able to say stop and sing.  She walks at baseline.  Attempted to contact POA - no answer.  POA Pamela Piano [phone] Past Medical History  Diagnosis Date  . Hypertension   . Hypercholesterolemia   . Osteoporosis   . Tendonitis   . Vitamin D deficiency   . Alzheimer disease    History reviewed. No pertinent past surgical history. No family history on file. History  Substance Use Topics  . Smoking status: Never Smoker   . Smokeless tobacco: Not on file  . Alcohol Use: No   OB History    No data available     Review of Systems  Unable to perform ROS     Allergies  Review of patient's allergies indicates no known allergies.  Home Medications   Prior to Admission medications   Medication Sig Start Date End Date Taking? Authorizing Provider  acetaminophen (TYLENOL) 650 MG CR tablet Take 650 mg by mouth 3 (three) times daily.   Yes Historical Provider, MD  ALPRAZolam (XANAX) 0.25 MG tablet Take 0.25 mg by mouth 2 (two) times daily.   Yes Historical Provider, MD  atorvastatin (LIPITOR) 20 MG tablet Take 20 mg by mouth daily.   Yes Historical Provider, MD  divalproex (DEPAKOTE ER) 250 MG 24 hr tablet Take 250 mg by mouth 2 (two) times daily.   Yes Historical Provider, MD  glipiZIDE (GLUCOTROL) 5 MG tablet Take 5 mg by  mouth daily before breakfast.   Yes Historical Provider, MD  lisinopril (PRINIVIL,ZESTRIL) 10 MG tablet Take 10 mg by mouth daily.   Yes Historical Provider, MD  Multiple Vitamins-Minerals (MULTIVITAMINS THER. W/MINERALS) TABS Take 1 tablet by mouth daily.   Yes Historical Provider, MD  Nutritional Supplements (NUTRITIONAL DRINK) LIQD Take 1 Bottle by mouth 3 (three) times daily.    Yes Historical Provider, MD  rivaroxaban (XARELTO) 10 MG TABS tablet Take 10 mg by mouth at bedtime.   Yes Historical Provider, MD  Vitamins-Lipotropics (LIPO-FLAVONOID PLUS PO) Take 1 tablet by mouth daily.   Yes Historical Provider, MD   BP 126/59 mmHg  Pulse 77  Temp(Src) 97.7 F (36.5 C) (Oral)  Resp 17  Ht  (1.575 m)  SpO2 98% Physical Exam  Constitutional: She appears well-developed and well-nourished.  HENT:  Head: Normocephalic.  Swelling to right forehead  Eyes: EOM are normal. Pupils are equal, round, and reactive to light.  Neck:  No appreciable C/T/L spine tenderness  Cardiovascular: Normal rate and regular rhythm.   No murmur heard. Pulmonary/Chest: Effort normal and breath sounds normal. No respiratory distress.  Abdominal: Soft. There is no tenderness. There is no rebound and no guarding.  Musculoskeletal: She exhibits no edema or tenderness.  Abrasion over right knee, ranges BLE without difficulty  Neurological: She is alert.  MAE symmetrically, nonverbal.  Does not follow commands but actively resists examination.    Skin: Skin is warm and dry.  Psychiatric:  Unable to assess  Nursing note and vitals reviewed.   ED Course  Procedures (including critical care time) Labs Review Labs Reviewed  URINALYSIS, ROUTINE W REFLEX MICROSCOPIC - Abnormal; Notable for the following:    APPearance CLOUDY (*)    Nitrite POSITIVE (*)    Leukocytes, UA MODERATE (*)    All other components within normal limits  COMPREHENSIVE METABOLIC PANEL - Abnormal; Notable for the following:     Creatinine, Ser 1.24 (*)    GFR calc non Af Amer 37 (*)    GFR calc Af Amer 43 (*)    All other components within normal limits  CBC WITH DIFFERENTIAL/PLATELET - Abnormal; Notable for the following:    RBC 3.66 (*)    Hemoglobin 11.7 (*)    HCT 35.2 (*)    Neutrophils Relative % 34 (*)    Neutro Abs 1.6 (*)    Lymphocytes Relative 54 (*)    All other components within normal limits  URINE MICROSCOPIC-ADD ON - Abnormal; Notable for the following:    Bacteria, UA MANY (*)    All other components within normal limits  URINE CULTURE  I-STAT TROPOININ, ED    Imaging Review Ct Head Wo Contrast  07/11/2014   CLINICAL DATA:  Fall.  Head injury  EXAM: CT HEAD WITHOUT CONTRAST  CT CERVICAL SPINE WITHOUT CONTRAST  TECHNIQUE: Multidetector CT imaging of the head and cervical spine was performed following the standard protocol without intravenous contrast. Multiplanar CT image reconstructions of the cervical spine were also generated.  COMPARISON:  CT head 11/30/2013  FINDINGS: CT HEAD FINDINGS  Moderate atrophy. Ventricular enlargement is stable and likely related to atrophy. Chronic microvascular ischemic change in the white matter unchanged.  Negative for acute infarct.  Negative for intracranial hemorrhage  Right frontal scalp hematoma.  Negative for skull fracture.  CT CERVICAL SPINE FINDINGS  Normal cervical alignment. Straightening of the cervical lordosis. Moderate disc degeneration and spondylosis at C3-4. Mild disc degeneration C4 through C7 without significant spinal stenosis.  IMPRESSION: Atrophy and chronic microvascular ischemic change. No acute intracranial abnormality.  Negative for cervical spine fracture.   Electronically Signed   By: Marlan Palau M.D.   On: 07/11/2014 08:52   Ct Cervical Spine Wo Contrast  07/11/2014   CLINICAL DATA:  Fall.  Head injury  EXAM: CT HEAD WITHOUT CONTRAST  CT CERVICAL SPINE WITHOUT CONTRAST  TECHNIQUE: Multidetector CT imaging of the head and cervical  spine was performed following the standard protocol without intravenous contrast. Multiplanar CT image reconstructions of the cervical spine were also generated.  COMPARISON:  CT head 11/30/2013  FINDINGS: CT HEAD FINDINGS  Moderate atrophy. Ventricular enlargement is stable and likely related to atrophy. Chronic microvascular ischemic change in the white matter unchanged.  Negative for acute infarct.  Negative for intracranial hemorrhage  Right frontal scalp hematoma.  Negative for skull fracture.  CT CERVICAL SPINE FINDINGS  Normal cervical alignment. Straightening of the cervical lordosis. Moderate disc degeneration and spondylosis at C3-4. Mild disc degeneration C4 through C7 without significant spinal stenosis.  IMPRESSION: Atrophy and chronic microvascular ischemic change. No acute intracranial abnormality.  Negative for cervical spine fracture.   Electronically Signed   By: Marlan Palau M.D.   On: 07/11/2014 08:52  EKG Interpretation None      MDM   Final diagnoses:  Fall, initial encounter  Head contusion, initial encounter  Acute urinary tract infection    Pt presents for evaluation of injuries following an unwitnessed fall. Patient does have a scalp hematoma on examination. There is no evidence of acute intracranial abnormality. Patient has no cervical tenderness, C-spine collar removed. BMP demonstrates mild renal insufficiency compared to prior, providing gentle IV fluid hydration. Patient does have a urinary tract infection, treated with one-time dose of Rocephin and prescription for Keflex. Eventually able to reach patient's PA POA Pamela Ramirez. Discussed with POA findings of labs and examination and plan to DC back to facility with close outpatient follow-up.  Tilden FossaElizabeth Arabela Basaldua, MD 07/11/14 1014

## 2014-07-14 LAB — URINE CULTURE

## 2014-07-15 ENCOUNTER — Telehealth (HOSPITAL_COMMUNITY): Payer: Self-pay

## 2014-07-15 NOTE — Telephone Encounter (Signed)
Post ED Visit - Positive Culture Follow-up  Culture report reviewed by antimicrobial stewardship pharmacist: []  Wes Dulaney, Pharm.D., BCPS [x]  Celedonio MiyamotoJeremy Frens, [address] BCPS []  Georgina PillionElizabeth Martin, Pharm.D., BCPS []  KaltagMinh Pham, [address]Ph[address] BCPS, AAHIVP []  Estella HuskMichelle Turner, Pharm.D., BCPS, AAHIVP []  Elder CyphersLorie Poole, [address] BCPS  Positive Urine culture, 100,000 colonies -> E Coli Treated with Cephalexin, organism sensitive to the same and no further patient follow-up is required at this time.  Arvid RightClark, Isom Kochan Dorn 07/15/2014, 6:42 PM

## 2014-07-18 ENCOUNTER — Emergency Department (HOSPITAL_COMMUNITY)
Admission: EM | Admit: 2014-07-18 | Discharge: 2014-07-18 | Disposition: A | Discharge status: Home / Self Care | Payer: Medicare Other | Attending: Emergency Medicine | Admitting: Emergency Medicine

## 2014-07-18 ENCOUNTER — Encounter (HOSPITAL_COMMUNITY): Payer: Self-pay | Admitting: Emergency Medicine

## 2014-07-18 ENCOUNTER — Emergency Department (HOSPITAL_COMMUNITY): Payer: Medicare Other

## 2014-07-18 DIAGNOSIS — Y9389 Activity, other specified: Secondary | ICD-10-CM | POA: Diagnosis not present

## 2014-07-18 DIAGNOSIS — Z79899 Other long term (current) drug therapy: Secondary | ICD-10-CM | POA: Diagnosis not present

## 2014-07-18 DIAGNOSIS — Z87440 Personal history of urinary (tract) infections: Secondary | ICD-10-CM | POA: Diagnosis not present

## 2014-07-18 DIAGNOSIS — E780 Pure hypercholesterolemia: Secondary | ICD-10-CM | POA: Diagnosis not present

## 2014-07-18 DIAGNOSIS — Z792 Long term (current) use of antibiotics: Secondary | ICD-10-CM | POA: Diagnosis not present

## 2014-07-18 DIAGNOSIS — W1839XA Other fall on same level, initial encounter: Secondary | ICD-10-CM | POA: Diagnosis not present

## 2014-07-18 DIAGNOSIS — Z86718 Personal history of other venous thrombosis and embolism: Secondary | ICD-10-CM | POA: Diagnosis not present

## 2014-07-18 DIAGNOSIS — F0280 Dementia in other diseases classified elsewhere without behavioral disturbance: Secondary | ICD-10-CM | POA: Insufficient documentation

## 2014-07-18 DIAGNOSIS — Z7901 Long term (current) use of anticoagulants: Secondary | ICD-10-CM | POA: Insufficient documentation

## 2014-07-18 DIAGNOSIS — Z8739 Personal history of other diseases of the musculoskeletal system and connective tissue: Secondary | ICD-10-CM | POA: Diagnosis not present

## 2014-07-18 DIAGNOSIS — S0011XA Contusion of right eyelid and periocular area, initial encounter: Secondary | ICD-10-CM | POA: Diagnosis not present

## 2014-07-18 DIAGNOSIS — Y92128 Other place in nursing home as the place of occurrence of the external cause: Secondary | ICD-10-CM | POA: Insufficient documentation

## 2014-07-18 DIAGNOSIS — S0591XA Unspecified injury of right eye and orbit, initial encounter: Secondary | ICD-10-CM | POA: Diagnosis present

## 2014-07-18 DIAGNOSIS — Y998 Other external cause status: Secondary | ICD-10-CM | POA: Insufficient documentation

## 2014-07-18 DIAGNOSIS — G309 Alzheimer's disease, unspecified: Secondary | ICD-10-CM | POA: Insufficient documentation

## 2014-07-18 DIAGNOSIS — R233 Spontaneous ecchymoses: Secondary | ICD-10-CM

## 2014-07-18 DIAGNOSIS — I10 Essential (primary) hypertension: Secondary | ICD-10-CM | POA: Insufficient documentation

## 2014-07-18 NOTE — ED Notes (Signed)
Upon medication review, Pt takes Xarelto.
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
## 2014-07-18 NOTE — ED Notes (Signed)
Per son, patient fell last week-right eye bruised
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
## 2014-07-18 NOTE — Discharge Instructions (Signed)
Hematoma A hematoma is a collection of blood under the skin, in an organ, in a body space, in a joint space, or in other tissue. The blood can clot to form a lump that you can see and feel. The lump is often firm and may sometimes become sore and tender. Most hematomas get better in a few days to weeks. However, some hematomas may be serious and require medical care. Hematomas can range in size from very small to very large. CAUSES  A hematoma can be caused by a blunt or penetrating injury. It can also be caused by spontaneous leakage from a blood vessel under the skin. Spontaneous leakage from a blood vessel is more likely to occur in older people, especially those taking blood thinners. Sometimes, a hematoma can develop after certain medical procedures. SIGNS AND SYMPTOMS   A firm lump on the body.  Possible pain and tenderness in the area.  Bruising.Blue, dark blue, purple-red, or yellowish skin may appear at the site of the hematoma if the hematoma is close to the surface of the skin. For hematomas in deeper tissues or body spaces, the signs and symptoms may be subtle. For example, an intra-abdominal hematoma may cause abdominal pain, weakness, fainting, and shortness of breath. An intracranial hematoma may cause a headache or symptoms such as weakness, trouble speaking, or a change in consciousness. DIAGNOSIS  A hematoma can usually be diagnosed based on your medical history and a physical exam. Imaging tests may be needed if your health care provider suspects a hematoma in deeper tissues or body spaces, such as the abdomen, head, or chest. These tests may include ultrasonography or a CT scan.  TREATMENT  Hematomas usually go away on their own over time. Rarely does the blood need to be drained out of the body. Large hematomas or those that may affect vital organs will sometimes need surgical drainage or monitoring. HOME CARE INSTRUCTIONS   Apply ice to the injured area:   Put ice in a  plastic bag.   Place a towel between your skin and the bag.   Leave the ice on for 20 minutes, 2-3 times a day for the first 1 to 2 days.   After the first 2 days, switch to using warm compresses on the hematoma.   Elevate the injured area to help decrease pain and swelling. Wrapping the area with an elastic bandage may also be helpful. Compression helps to reduce swelling and promotes shrinking of the hematoma. Make sure the bandage is not wrapped too tight.   If your hematoma is on a lower extremity and is painful, crutches may be helpful for a couple days.   Only take over-the-counter or prescription medicines as directed by your health care provider. SEEK IMMEDIATE MEDICAL CARE IF:   You have increasing pain, or your pain is not controlled with medicine.   You have a fever.   You have worsening swelling or discoloration.   Your skin over the hematoma breaks or starts bleeding.   Your hematoma is in your chest or abdomen and you have weakness, shortness of breath, or a change in consciousness.  Your hematoma is on your scalp (caused by a fall or injury) and you have a worsening headache or a change in alertness or consciousness. MAKE SURE YOU:   Understand these instructions.  Will watch your condition.  Will get help right away if you are not doing well or get worse. Document Released: 10/14/2003 Document Revised: 11/01/2012 Document Reviewed: 08/09/2012  ExitCare Patient Information 2015 [place] [place]. This information is not intended to replace advice given to you by your health care provider. Make sure you discuss any questions you have with your health care provider.  Eye Contusion  An eye contusion is a deep bruise of the eye. It is often called a "black eye." Contusions happen when an injury causes bleeding under the skin. Signs of bruising include pain, puffiness (swelling), and discolored skin. The contusion may turn blue, purple, or yellow. A black eye can  be very serious and can affect the eyeball and sight. HOME CARE  Put ice on the injured area.  Put ice in a plastic bag.  Place a towel between your skin and the bag.  Leave the ice on for 15-20 minutes, 03-04 times a day.  If there is no injury to the eye, you may keep doing normal activities.  Wear sunglasses if bright lights bother you.  Sleep with your head raised (elevated) to help with discomfort.  Only take medicines as told by your doctor. GET HELP RIGHT AWAY IF:  You notice any vision loss.  You see two of everything (double vision).  You feel sick to your stomach (nauseous).  You feel dizzy, sleepy, or like you will pass out (faint).  You have any fluid coming from your eye or nose.  You have puffiness and bruising that does not fade. MAKE SURE YOU:   Understand these instructions.  Will watch your condition.  Will get help right away if you are not doing well or get worse. Document Released: 02/18/2011 Document Revised: 05/24/2011 Document Reviewed: 02/18/2011 [facility] Patient Information 2015 [place], [place]. This information is not intended to replace advice given to you by your health care provider. Make sure you discuss any questions you have with your health care provider.

## 2014-10-24 ENCOUNTER — Emergency Department (HOSPITAL_COMMUNITY): Payer: Medicare Other

## 2014-10-24 ENCOUNTER — Encounter (HOSPITAL_COMMUNITY): Payer: Self-pay | Admitting: Emergency Medicine

## 2014-10-24 ENCOUNTER — Emergency Department (HOSPITAL_COMMUNITY)
Admission: EM | Admit: 2014-10-24 | Discharge: 2014-10-24 | Disposition: A | Discharge status: Skilled Nursing Facility | Payer: Medicare Other | Attending: Emergency Medicine | Admitting: Emergency Medicine

## 2014-10-24 DIAGNOSIS — F0280 Dementia in other diseases classified elsewhere without behavioral disturbance: Secondary | ICD-10-CM | POA: Diagnosis not present

## 2014-10-24 DIAGNOSIS — E780 Pure hypercholesterolemia: Secondary | ICD-10-CM | POA: Diagnosis not present

## 2014-10-24 DIAGNOSIS — Y9389 Activity, other specified: Secondary | ICD-10-CM | POA: Insufficient documentation

## 2014-10-24 DIAGNOSIS — Y9289 Other specified places as the place of occurrence of the external cause: Secondary | ICD-10-CM | POA: Insufficient documentation

## 2014-10-24 DIAGNOSIS — S0990XA Unspecified injury of head, initial encounter: Secondary | ICD-10-CM | POA: Insufficient documentation

## 2014-10-24 DIAGNOSIS — G309 Alzheimer's disease, unspecified: Secondary | ICD-10-CM | POA: Diagnosis not present

## 2014-10-24 DIAGNOSIS — Z79899 Other long term (current) drug therapy: Secondary | ICD-10-CM | POA: Diagnosis not present

## 2014-10-24 DIAGNOSIS — I10 Essential (primary) hypertension: Secondary | ICD-10-CM | POA: Insufficient documentation

## 2014-10-24 DIAGNOSIS — Y998 Other external cause status: Secondary | ICD-10-CM | POA: Insufficient documentation

## 2014-10-24 DIAGNOSIS — W01198A Fall on same level from slipping, tripping and stumbling with subsequent striking against other object, initial encounter: Secondary | ICD-10-CM | POA: Diagnosis not present

## 2014-10-24 DIAGNOSIS — W19XXXA Unspecified fall, initial encounter: Secondary | ICD-10-CM

## 2014-10-24 NOTE — ED Notes (Signed)
Pt was found in the floor this morning at nursing facility [facility]. Unknown LOC. Pt has knot noted to right forehead. Pt has history of dementia and is nonverbal.

## 2014-10-24 NOTE — ED Notes (Addendum)
Pt picked up by [facility] for transportation back to nursing facility. D/C paperwork given to EMS. VSS upon discharge

## 2014-10-24 NOTE — Discharge Instructions (Signed)
Patient status post fall most likely out of bed during the night at nursing facility. Resulting in a hematoma to the right fore head. Head CT neck CT negative for any acute injuries. X-rays of the pelvis and hips without any bony injuries there. Patient is on the blood thinners Xarelto, by reviewing the chart not clear why she is on this blood thinner. Patient has been seen by [place] now for the third time for a fall since September. Would recommend at nursing facility review the requirements and the actual clinical need for the blood thinner and stop it if possible.

## 2014-10-24 NOTE — ED Notes (Signed)
Family at bedside. MD at bedside for re-evaluation.

## 2014-10-24 NOTE — ED Notes (Signed)
Pt to xray

## 2014-10-24 NOTE — ED Provider Notes (Signed)
CSN: [id number]     Arrival date & time 10/24/14  0800 History   First MD Initiated Contact with Patient 10/24/14 [phone]     Chief Complaint  Patient presents with  . Fall     (Consider location/radiation/quality/duration/timing/severity/associated sxs/prior Treatment) Patient is a 81 y.o. female presenting with fall. The history is provided by the patient, the nursing home and a relative. The history is limited by the condition of the patient.  Fall   level V caveat applies to the history due to Alzheimer's.  Patient from nursing facility patient fell on the floor this morning presumed to have fallen out of bed. Had a knot on her right forehead. Patient has a history of significant Alzheimer's disease and is nonverbal. Patient is on blood thinners Xarelto. Family members are not clear why she is on this blood thinner.  Past Medical History  Diagnosis Date  . Hypertension   . Hypercholesterolemia   . Osteoporosis   . Tendonitis   . Vitamin D deficiency   . Alzheimer disease    History reviewed. No pertinent past surgical history. History reviewed. No pertinent family history. Social History  Substance Use Topics  . Smoking status: Never Smoker   . Smokeless tobacco: None  . Alcohol Use: No   OB History    No data available     Review of Systems  Unable to perform ROS  level V caveat applies due to the Alzheimer's.    Allergies  Review of patient's allergies indicates no known allergies.  Home Medications   Prior to Admission medications   Medication Sig Start Date End Date Taking? Authorizing Provider  acetaminophen (TYLENOL) 650 MG CR tablet Take 650 mg by mouth 3 (three) times daily.   Yes Historical Provider, MD  ALPRAZolam (XANAX) 0.25 MG tablet Take 0.25 mg by mouth 2 (two) times daily.   Yes Historical Provider, MD  atorvastatin (LIPITOR) 20 MG tablet Take 20 mg by mouth at bedtime.    Yes Historical Provider, MD  divalproex (DEPAKOTE ER) 250 MG 24 hr tablet  Take 250 mg by mouth 2 (two) times daily.   Yes Historical Provider, MD  glipiZIDE (GLUCOTROL) 5 MG tablet Take 5 mg by mouth daily before breakfast.   Yes Historical Provider, MD  lisinopril (PRINIVIL,ZESTRIL) 10 MG tablet Take 10 mg by mouth daily.   Yes Historical Provider, MD  Multiple Vitamins-Minerals (MULTIVITAMINS THER. W/MINERALS) TABS Take 1 tablet by mouth daily.   Yes Historical Provider, MD  Nutritional Supplements (NUTRITIONAL DRINK) LIQD Take 1 Bottle by mouth 3 (three) times daily.    Yes Historical Provider, MD  rivaroxaban (XARELTO) 10 MG TABS tablet Take 10 mg by mouth daily.    Yes Historical Provider, MD  Vitamins-Lipotropics (LIPO-FLAVONOID PLUS PO) Take 1 tablet by mouth daily.   Yes Historical Provider, MD   BP 129/59 mmHg  Pulse 80  Temp(Src) 97.5 F (36.4 C) (Axillary)  Resp 20  SpO2 96% Physical Exam  Constitutional: She appears well-developed and well-nourished. No distress.  HENT:  Head: Normocephalic.  Mouth/Throat: Oropharynx is clear and moist.  Right forehead area with a 3 cm x 3 cm hematoma no abrasion no bleeding no step-off.  Eyes: Conjunctivae and EOM are normal. Pupils are equal, round, and reactive to light.  Neck: Normal range of motion. Neck supple.  Cardiovascular: Normal rate, regular rhythm and normal heart sounds.   No murmur heard. Pulmonary/Chest: Effort normal and breath sounds normal. No respiratory distress.  Abdominal: Soft. Bowel  sounds are normal. There is no tenderness.  Musculoskeletal: Normal range of motion.  Moving all 4 extremities  Neurological: She is alert. No cranial nerve deficit. She exhibits normal muscle tone. Coordination normal.  Skin: Skin is warm. No rash noted.  Nursing note and vitals reviewed.   ED Course  Procedures (including critical care time) Labs Review Labs Reviewed - No data to display  Imaging Review Ct Head Wo Contrast  10/24/2014   CLINICAL DATA:  Found on floor this morning at nursing  facility. Unknown loss of consciousness. Knot to the right forehead. Dementia. Initial encounter.  EXAM: CT HEAD WITHOUT CONTRAST  CT CERVICAL SPINE WITHOUT CONTRAST  TECHNIQUE: Multidetector CT imaging of the head and cervical spine was performed following the standard protocol without intravenous contrast. Multiplanar CT image reconstructions of the cervical spine were also generated.  COMPARISON:  07/11/2014  FINDINGS: CT HEAD FINDINGS  There is a moderate size right frontal scalp hematoma. Moderate ventricular enlargement is unchanged, with only mild sulcal enlargement again seen. Periventricular white-matter hypodensities are unchanged. There is no evidence of acute cortical infarct, intracranial hemorrhage, mass, midline shift, or extra-axial fluid collection.  Prior bilateral cataract extraction is noted. The right frontal sinus is small and completely opacified. There is subtotal opacification of the left frontal sinus and right maxillary sinus, incompletely visualized. Partial right anterior ethmoid air cell opacification is also noted. Visualized mastoid air cells are clear. Moderate carotid siphon calcification is noted. No skull fracture is seen.  CT CERVICAL SPINE FINDINGS  Cervical spine straightening is unchanged. There is no listhesis. No acute cervical spine fracture is identified. Vertebral body heights are preserved. Degenerative changes are noted at the atlantodental articulation. Severe disc space narrowing at C3-4 is unchanged. Bridging anterior osteophytes are present from C2 to C7. Degenerative changes with large osteophytes are also noted at the costovertebral junction on the right at T2. Moderate atherosclerotic calcification is present at the carotid bifurcations.  IMPRESSION: 1. No evidence of acute traumatic intracranial abnormality. 2. Right frontal scalp hematoma. 3. Unchanged, moderate ventricular enlargement. This may reflect central predominant cerebral atrophy, however normal  pressure hydrocephalus is not excluded in the appropriate clinical setting. 4. Unchanged cerebral white matter hypodensities, nonspecific but compatible with chronic small vessel ischemia. 5. No acute osseous abnormality identified in the cervical spine. 6. Bilateral frontal and right maxillary sinus inflammatory mucosal disease. Correlate clinically for acute sinusitis.   Electronically Signed   By: Sebastian Ache   On: 10/24/2014 09:32   Ct Cervical Spine Wo Contrast  10/24/2014   CLINICAL DATA:  Found on floor this morning at nursing facility. Unknown loss of consciousness. Knot to the right forehead. Dementia. Initial encounter.  EXAM: CT HEAD WITHOUT CONTRAST  CT CERVICAL SPINE WITHOUT CONTRAST  TECHNIQUE: Multidetector CT imaging of the head and cervical spine was performed following the standard protocol without intravenous contrast. Multiplanar CT image reconstructions of the cervical spine were also generated.  COMPARISON:  07/11/2014  FINDINGS: CT HEAD FINDINGS  There is a moderate size right frontal scalp hematoma. Moderate ventricular enlargement is unchanged, with only mild sulcal enlargement again seen. Periventricular white-matter hypodensities are unchanged. There is no evidence of acute cortical infarct, intracranial hemorrhage, mass, midline shift, or extra-axial fluid collection.  Prior bilateral cataract extraction is noted. The right frontal sinus is small and completely opacified. There is subtotal opacification of the left frontal sinus and right maxillary sinus, incompletely visualized. Partial right anterior ethmoid air cell opacification is also noted. Visualized  mastoid air cells are clear. Moderate carotid siphon calcification is noted. No skull fracture is seen.  CT CERVICAL SPINE FINDINGS  Cervical spine straightening is unchanged. There is no listhesis. No acute cervical spine fracture is identified. Vertebral body heights are preserved. Degenerative changes are noted at the  atlantodental articulation. Severe disc space narrowing at C3-4 is unchanged. Bridging anterior osteophytes are present from C2 to C7. Degenerative changes with large osteophytes are also noted at the costovertebral junction on the right at T2. Moderate atherosclerotic calcification is present at the carotid bifurcations.  IMPRESSION: 1. No evidence of acute traumatic intracranial abnormality. 2. Right frontal scalp hematoma. 3. Unchanged, moderate ventricular enlargement. This may reflect central predominant cerebral atrophy, however normal pressure hydrocephalus is not excluded in the appropriate clinical setting. 4. Unchanged cerebral white matter hypodensities, nonspecific but compatible with chronic small vessel ischemia. 5. No acute osseous abnormality identified in the cervical spine. 6. Bilateral frontal and right maxillary sinus inflammatory mucosal disease. Correlate clinically for acute sinusitis.   Electronically Signed   By: Sebastian Ache   On: 10/24/2014 09:32   Dg Hips Bilat With Pelvis Min 5 Views  10/24/2014   CLINICAL DATA:  The patient was found down this morning at a nursing facility. Scalp swelling over the right side of the forehead. Dementia.  EXAM: DG HIP (WITH OR WITHOUT PELVIS) 5+V BILAT  COMPARISON:  None.  FINDINGS: There is no fracture or dislocation. Slight arthritic changes of both hip joints. Sacroiliac joints are normal. Visualized bowel gas pattern is normal.  IMPRESSION: No acute abnormalities.   Electronically Signed   By: Francene Boyers M.D.   On: 10/24/2014 09:19     EKG Interpretation None      MDM   Final diagnoses:  Fall  Head injury, initial encounter    Patient status post fall sometime during the night most likely out of bed at nursing facility. Patient noted to have a knot on her right forehead area consistent with hematoma. Patient has Alzheimer's no specific complaints. This is patient's third fall since September. Not clear why patient is on blood  thinners.  Head CT negative neck CT negative x-rays of pelvis and hips negative. Have requested the nursing home evaluate the clinical indications for the blood thinners and stop him if they are not necessary due to high risk of falls.    Vanetta Mulders, MD 10/24/14 1027

## 2014-11-22 ENCOUNTER — Emergency Department (HOSPITAL_COMMUNITY)
Admission: EM | Admit: 2014-11-22 | Discharge: 2014-11-22 | Disposition: A | Discharge status: Home / Self Care | Payer: Medicare Other | Attending: Emergency Medicine | Admitting: Emergency Medicine

## 2014-11-22 ENCOUNTER — Emergency Department (HOSPITAL_COMMUNITY): Payer: Medicare Other

## 2014-11-22 ENCOUNTER — Encounter (HOSPITAL_COMMUNITY): Payer: Self-pay | Admitting: Emergency Medicine

## 2014-11-22 DIAGNOSIS — Z79899 Other long term (current) drug therapy: Secondary | ICD-10-CM | POA: Insufficient documentation

## 2014-11-22 DIAGNOSIS — F0280 Dementia in other diseases classified elsewhere without behavioral disturbance: Secondary | ICD-10-CM | POA: Insufficient documentation

## 2014-11-22 DIAGNOSIS — G309 Alzheimer's disease, unspecified: Secondary | ICD-10-CM | POA: Insufficient documentation

## 2014-11-22 DIAGNOSIS — Z7982 Long term (current) use of aspirin: Secondary | ICD-10-CM | POA: Insufficient documentation

## 2014-11-22 DIAGNOSIS — Y92129 Unspecified place in nursing home as the place of occurrence of the external cause: Secondary | ICD-10-CM | POA: Diagnosis not present

## 2014-11-22 DIAGNOSIS — M810 Age-related osteoporosis without current pathological fracture: Secondary | ICD-10-CM | POA: Insufficient documentation

## 2014-11-22 DIAGNOSIS — E780 Pure hypercholesterolemia: Secondary | ICD-10-CM | POA: Diagnosis not present

## 2014-11-22 DIAGNOSIS — W1809XA Striking against other object with subsequent fall, initial encounter: Secondary | ICD-10-CM | POA: Diagnosis not present

## 2014-11-22 DIAGNOSIS — I10 Essential (primary) hypertension: Secondary | ICD-10-CM | POA: Insufficient documentation

## 2014-11-22 DIAGNOSIS — S0990XA Unspecified injury of head, initial encounter: Secondary | ICD-10-CM | POA: Diagnosis present

## 2014-11-22 DIAGNOSIS — S0003XA Contusion of scalp, initial encounter: Secondary | ICD-10-CM | POA: Diagnosis not present

## 2014-11-22 DIAGNOSIS — Y998 Other external cause status: Secondary | ICD-10-CM | POA: Insufficient documentation

## 2014-11-22 DIAGNOSIS — Y9389 Activity, other specified: Secondary | ICD-10-CM | POA: Insufficient documentation

## 2014-11-22 MED ORDER — ACETAMINOPHEN 325 MG PO TABS
650.0000 mg | ORAL_TABLET | Freq: Once | ORAL | Status: AC
  Administered 2014-11-22: 650 mg via ORAL
  Filled 2014-11-22: qty 2

## 2014-11-22 NOTE — ED Notes (Signed)
Patient is from skilled facility and fell while ambulating. Patient bumped her head. She has a large hematoma to her left forehead. Patient is combative with history of dementia. EMS was unable to put c collar on the patient due to her combativeness. Patient is not on blood thinners.

## 2014-11-22 NOTE — ED Notes (Signed)
Pt combative when attempting to get vitals signs. Swinging and kicking at staff.

## 2014-11-22 NOTE — ED Provider Notes (Signed)
CSN: [id number]     Arrival date & time 11/22/14  1525 History   First MD Initiated Contact with Patient 11/22/14 1543     Chief Complaint  Patient presents with  . Fall      HPI Patient is from skilled facility and fell while ambulating. Patient bumped her head. She has a large hematoma to her left forehead. Patient is combative with history of dementia. EMS was unable to put c collar on the patient due to her combativeness. Patient is not on blood thinners. Past Medical History  Diagnosis Date  . Hypertension   . Hypercholesterolemia   . Osteoporosis   . Tendonitis   . Vitamin D deficiency   . Alzheimer disease    History reviewed. No pertinent past surgical history. No family history on file. Social History  Substance Use Topics  . Smoking status: Never Smoker   . Smokeless tobacco: None  . Alcohol Use: No   OB History    No data available     Review of Systems  Unable to perform ROS: Dementia      Allergies  Review of patient's allergies indicates no known allergies.  Home Medications   Prior to Admission medications   Medication Sig Start Date End Date Taking? Authorizing Provider  acetaminophen (TYLENOL) 500 MG tablet Take 500 mg by mouth every 4 (four) hours as needed for mild pain, moderate pain, fever or headache.   Yes Historical Provider, MD  acetaminophen (TYLENOL) 650 MG CR tablet Take 650 mg by mouth 3 (three) times daily.   Yes Historical Provider, MD  ALPRAZolam (XANAX) 0.25 MG tablet Take 0.25 mg by mouth 2 (two) times daily.   Yes Historical Provider, MD  alum & mag hydroxide-simeth (MAALOX/MYLANTA) 200-200-20 MG/5ML suspension Take 30 mLs by mouth every 6 (six) hours as needed for indigestion or heartburn.   Yes Historical Provider, MD  aspirin 81 MG chewable tablet Chew 81 mg by mouth daily.   Yes Historical Provider, MD  atorvastatin (LIPITOR) 20 MG tablet Take 20 mg by mouth at bedtime.    Yes Historical Provider, MD  divalproex (DEPAKOTE  SPRINKLE) 125 MG capsule Take 250 mg by mouth 3 (three) times daily.   Yes Historical Provider, MD  glipiZIDE (GLUCOTROL) 5 MG tablet Take 5 mg by mouth daily before breakfast.   Yes Historical Provider, MD  haloperidol (HALDOL) 0.5 MG tablet Take 0.5 mg by mouth 2 (two) times daily.   Yes Historical Provider, MD  lisinopril (PRINIVIL,ZESTRIL) 10 MG tablet Take 10 mg by mouth daily.   Yes Historical Provider, MD  loperamide (IMODIUM) 2 MG capsule Take 2 mg by mouth as needed for diarrhea or loose stools.   Yes Historical Provider, MD  magnesium hydroxide (MILK OF MAGNESIA) 400 MG/5ML suspension Take 30 mLs by mouth at bedtime as needed for mild constipation or moderate constipation.   Yes Historical Provider, MD  Multiple Vitamins-Minerals (MULTIVITAMINS THER. W/MINERALS) TABS Take 1 tablet by mouth daily.   Yes Historical Provider, MD  Nutritional Supplements (NUTRITIONAL DRINK) LIQD Take 1 Bottle by mouth 3 (three) times daily.    Yes Historical Provider, MD  rivastigmine (EXELON) 1.5 MG capsule Take 1.5 mg by mouth 2 (two) times daily.   Yes Historical Provider, MD  Vitamins-Lipotropics (LIPO-FLAVONOID PLUS PO) Take 1 tablet by mouth daily.   Yes Historical Provider, MD   BP 102/76 mmHg  Pulse 81  Temp(Src) 97.4 F (36.3 C)  Resp 18  SpO2 98% Physical Exam  Constitutional: She appears well-developed and well-nourished. No distress.  HENT:  Head: Normocephalic.    Eyes: Pupils are equal, round, and reactive to light.  Neck: Normal range of motion.  Cardiovascular: Normal rate and intact distal pulses.   Pulmonary/Chest: No respiratory distress.  Abdominal: Normal appearance. She exhibits no distension.  Musculoskeletal: Normal range of motion.  Neurological: She is alert. No cranial nerve deficit.  Skin: Skin is warm and dry. No rash noted.  Psychiatric: She has a normal mood and affect. Her behavior is normal.  Nursing note and vitals reviewed.   ED Course  Procedures  (including critical care time) Labs Review Labs Reviewed - No data to display  Imaging Review Ct Head Wo Contrast  11/22/2014   CLINICAL DATA:  Fall  EXAM: CT HEAD WITHOUT CONTRAST  TECHNIQUE: Contiguous axial images were obtained from the base of the skull through the vertex without intravenous contrast.  COMPARISON:  10/24/2014  FINDINGS: Global atrophy is stable. Chronic ischemic changes in the periventricular white matter are stable. No mass effect, midline shift, or acute intracranial hemorrhage. Left frontal scalp hematoma is noted. No underlying fracture. Scattered opacification of the paranasal sinuses is stable.  IMPRESSION: No acute intracranial pathology.   Electronically Signed   By: Jolaine Click M.D.   On: 11/22/2014 17:11   I have personally reviewed and evaluated these images and lab results as part of my medical decision-making.    MDM   Final diagnoses:  Scalp hematoma, initial encounter        Nelva Nay, MD 11/22/14 1725

## 2014-11-22 NOTE — ED Notes (Signed)
Patient combative when trying to obtain vitals and actively trying to get out of the bed.

## 2014-11-22 NOTE — Discharge Instructions (Signed)

## 2014-11-26 ENCOUNTER — Emergency Department (HOSPITAL_COMMUNITY): Payer: Medicare Other

## 2014-11-26 ENCOUNTER — Encounter (HOSPITAL_COMMUNITY): Payer: Self-pay

## 2014-11-26 ENCOUNTER — Emergency Department (HOSPITAL_COMMUNITY)
Admission: EM | Admit: 2014-11-26 | Discharge: 2014-11-26 | Disposition: A | Discharge status: Home / Self Care | Payer: Medicare Other | Attending: Emergency Medicine | Admitting: Emergency Medicine

## 2014-11-26 ENCOUNTER — Other Ambulatory Visit: Payer: Self-pay

## 2014-11-26 DIAGNOSIS — S0083XA Contusion of other part of head, initial encounter: Secondary | ICD-10-CM | POA: Insufficient documentation

## 2014-11-26 DIAGNOSIS — Y998 Other external cause status: Secondary | ICD-10-CM | POA: Insufficient documentation

## 2014-11-26 DIAGNOSIS — W01198A Fall on same level from slipping, tripping and stumbling with subsequent striking against other object, initial encounter: Secondary | ICD-10-CM | POA: Diagnosis not present

## 2014-11-26 DIAGNOSIS — I10 Essential (primary) hypertension: Secondary | ICD-10-CM | POA: Insufficient documentation

## 2014-11-26 DIAGNOSIS — Z7982 Long term (current) use of aspirin: Secondary | ICD-10-CM | POA: Diagnosis not present

## 2014-11-26 DIAGNOSIS — Y92128 Other place in nursing home as the place of occurrence of the external cause: Secondary | ICD-10-CM | POA: Insufficient documentation

## 2014-11-26 DIAGNOSIS — S0003XA Contusion of scalp, initial encounter: Secondary | ICD-10-CM | POA: Insufficient documentation

## 2014-11-26 DIAGNOSIS — F0280 Dementia in other diseases classified elsewhere without behavioral disturbance: Secondary | ICD-10-CM | POA: Insufficient documentation

## 2014-11-26 DIAGNOSIS — Z8739 Personal history of other diseases of the musculoskeletal system and connective tissue: Secondary | ICD-10-CM | POA: Insufficient documentation

## 2014-11-26 DIAGNOSIS — W19XXXA Unspecified fall, initial encounter: Secondary | ICD-10-CM

## 2014-11-26 DIAGNOSIS — Z79899 Other long term (current) drug therapy: Secondary | ICD-10-CM | POA: Diagnosis not present

## 2014-11-26 DIAGNOSIS — Y9389 Activity, other specified: Secondary | ICD-10-CM | POA: Insufficient documentation

## 2014-11-26 DIAGNOSIS — S0012XA Contusion of left eyelid and periocular area, initial encounter: Secondary | ICD-10-CM | POA: Insufficient documentation

## 2014-11-26 DIAGNOSIS — G309 Alzheimer's disease, unspecified: Secondary | ICD-10-CM | POA: Diagnosis not present

## 2014-11-26 DIAGNOSIS — S0990XA Unspecified injury of head, initial encounter: Secondary | ICD-10-CM | POA: Diagnosis present

## 2014-11-26 DIAGNOSIS — E162 Hypoglycemia, unspecified: Secondary | ICD-10-CM | POA: Diagnosis not present

## 2014-11-26 DIAGNOSIS — N390 Urinary tract infection, site not specified: Secondary | ICD-10-CM | POA: Insufficient documentation

## 2014-11-26 DIAGNOSIS — E780 Pure hypercholesterolemia: Secondary | ICD-10-CM | POA: Diagnosis not present

## 2014-11-26 LAB — URINE MICROSCOPIC-ADD ON

## 2014-11-26 LAB — CBG MONITORING, ED
GLUCOSE-CAPILLARY: 36 mg/dL — AB (ref 65–99)
GLUCOSE-CAPILLARY: 115 mg/dL — AB (ref 65–99)
Glucose-Capillary: 53 mg/dL — ABNORMAL LOW (ref 65–99)
Glucose-Capillary: 83 mg/dL (ref 65–99)
Glucose-Capillary: 104 mg/dL — ABNORMAL HIGH (ref 65–99)

## 2014-11-26 LAB — BASIC METABOLIC PANEL
ANION GAP: 9 (ref 5–15)
BUN: 19 mg/dL (ref 6–20)
CHLORIDE: 108 mmol/L (ref 101–111)
CO2: 23 mmol/L (ref 22–32)
Calcium: 9.6 mg/dL (ref 8.9–10.3)
Creatinine, Ser: 0.91 mg/dL (ref 0.44–1.00)
GFR calc non Af Amer: 54 mL/min — ABNORMAL LOW (ref >60)
GLUCOSE: 108 mg/dL — AB (ref 65–99)
Potassium: 4.9 mmol/L (ref 3.5–5.1)
Sodium: 140 mmol/L (ref 135–145)

## 2014-11-26 LAB — URINALYSIS, ROUTINE W REFLEX MICROSCOPIC
Bilirubin Urine: NEGATIVE
Glucose, UA: NEGATIVE mg/dL
HGB URINE DIPSTICK: NEGATIVE
Ketones, ur: NEGATIVE mg/dL
NITRITE: NEGATIVE
PROTEIN: NEGATIVE mg/dL
SPECIFIC GRAVITY, URINE: 1.004 — AB (ref 1.005–1.030)
UROBILINOGEN UA: 0.2 mg/dL (ref 0.0–1.0)
pH: 6.5 (ref 5.0–8.0)

## 2014-11-26 LAB — CBC WITH DIFFERENTIAL/PLATELET
BASOS PCT: 0 % (ref 0–1)
Basophils Absolute: 0 10*3/uL (ref 0.0–0.1)
Eosinophils Absolute: 0.2 10*3/uL (ref 0.0–0.7)
Eosinophils Relative: 3 % (ref 0–5)
HEMATOCRIT: 35.1 % — AB (ref 36.0–46.0)
HEMOGLOBIN: 11.3 g/dL — AB (ref 12.0–15.0)
LYMPHS PCT: 38 % (ref 12–46)
Lymphs Abs: 2.2 10*3/uL (ref 0.7–4.0)
MCH: 31 pg (ref 26.0–34.0)
MCHC: 32.2 g/dL (ref 30.0–36.0)
MCV: 96.2 fL (ref 78.0–100.0)
MONO ABS: 0.6 10*3/uL (ref 0.1–1.0)
Monocytes Relative: 9 % (ref 3–12)
NEUTROS ABS: 2.9 10*3/uL (ref 1.7–7.7)
NEUTROS PCT: 50 % (ref 43–77)
Platelets: 203 10*3/uL (ref 150–400)
RBC: 3.65 MIL/uL — ABNORMAL LOW (ref 3.87–5.11)
RDW: 12.8 % (ref 11.5–15.5)
WBC: 5.9 10*3/uL (ref 4.0–10.5)

## 2014-11-26 MED ORDER — GLUCAGON HCL RDNA (DIAGNOSTIC) 1 MG IJ SOLR
INTRAMUSCULAR | Status: AC
  Filled 2014-11-26: qty 1

## 2014-11-26 MED ORDER — DEXTROSE 50 % IV SOLN
1.0000 | Freq: Once | INTRAVENOUS | Status: AC
  Administered 2014-11-26: 50 mL via INTRAVENOUS
  Filled 2014-11-26: qty 50

## 2014-11-26 MED ORDER — STERILE WATER FOR INJECTION IJ SOLN
INTRAMUSCULAR | Status: AC
  Administered 2014-11-26: 10 mL
  Filled 2014-11-26: qty 10

## 2014-11-26 MED ORDER — GLUCAGON HCL RDNA (DIAGNOSTIC) 1 MG IJ SOLR
1.0000 mg | Freq: Once | INTRAMUSCULAR | Status: AC
  Administered 2014-11-26: 1 mg via INTRAMUSCULAR

## 2014-11-26 MED ORDER — CEPHALEXIN 500 MG PO CAPS: 500.0000 mg | ORAL_CAPSULE | Freq: Three times a day (TID) | ORAL | Status: DC

## 2014-11-26 MED ORDER — LORAZEPAM 2 MG/ML IJ SOLN
1.0000 mg | INTRAMUSCULAR | Status: AC | PRN
  Administered 2014-11-26: 1 mg via INTRAVENOUS
  Filled 2014-11-26: qty 1

## 2014-11-26 NOTE — ED Notes (Signed)
Family at bedside. 

## 2014-11-26 NOTE — ED Notes (Signed)
Family states they would like to hold off on an IV at this time.

## 2014-11-26 NOTE — Discharge Instructions (Signed)
Hypoglycemia secondary to decreased oral intake and continued administration of glipizide.  Please discontinuing the administration of glipizide to the patient.  She needs to follow up with a primary care provider for evaluation of her diabetes to determine further need of glipizide versus another medication.  Glipizide should not be given to patient when they are not eating, as it can lead to low blood sugar.    Hypoglycemia Hypoglycemia occurs when the glucose in your blood is too low. Glucose is a type of sugar that is your body's main energy source. Hormones, such as insulin and glucagon, control the level of glucose in the blood. Insulin lowers blood glucose and glucagon increases blood glucose. Having too much insulin in your blood stream, or not eating enough food containing sugar, can result in hypoglycemia. Hypoglycemia can happen to people with or without diabetes. It can develop quickly and can be a medical emergency.  CAUSES   Missing or delaying meals.  Not eating enough carbohydrates at meals.  Taking too much diabetes medicine.  Not timing your oral diabetes medicine or insulin doses with meals, snacks, and exercise.  Nausea and vomiting.  Certain medicines.  Severe illnesses, such as hepatitis, kidney disorders, and certain eating disorders.  Increased activity or exercise without eating something extra or adjusting medicines.  Drinking too much alcohol.  A nerve disorder that affects body functions like your heart rate, blood pressure, and digestion (autonomic neuropathy).  A condition where the stomach muscles do not function properly (gastroparesis). Therefore, medicines and food may not absorb properly.  Rarely, a tumor of the pancreas can produce too much insulin. SYMPTOMS   Hunger.  Sweating (diaphoresis).  Change in body temperature.  Shakiness.  Headache.  Anxiety.  Lightheadedness.  Irritability.  Difficulty concentrating.  Dry  mouth.  Tingling or numbness in the hands or feet.  Restless sleep or sleep disturbances.  Altered speech and coordination.  Change in mental status.  Seizures or prolonged convulsions.  Combativeness.  Drowsiness (lethargic).  Weakness.  Increased heart rate or palpitations.  Confusion.  Pale, gray skin color.  Blurred or double vision.  Fainting. DIAGNOSIS  A physical exam and medical history will be performed. Your caregiver may make a diagnosis based on your symptoms. Blood tests and other lab tests may be performed to confirm a diagnosis. Once the diagnosis is made, your caregiver will see if your signs and symptoms go away once your blood glucose is raised.  TREATMENT  Usually, you can easily treat your hypoglycemia when you notice symptoms.  Check your blood glucose. If it is less than 70 mg/dl, take one of the following:   3-4 glucose tablets.    cup juice.    cup regular soda.   1 cup skim milk.   -1 tube of glucose gel.   5-6 hard candies.   Avoid high-fat drinks or food that may delay a rise in blood glucose levels.  Do not take more than the recommended amount of sugary foods, drinks, gel, or tablets. Doing so will cause your blood glucose to go too high.   Wait 10-15 minutes and recheck your blood glucose. If it is still less than 70 mg/dl or below your target range, repeat treatment.   Eat a snack if it is more than 1 hour until your next meal.  There may be a time when your blood glucose may go so low that you are unable to treat yourself at home when you start to notice symptoms. You may  need someone to help you. You may even faint or be unable to swallow. If you cannot treat yourself, someone will need to bring you to the hospital.  HOME CARE INSTRUCTIONS  If you have diabetes, follow your diabetes management plan by:  Taking your medicines as directed.  Following your exercise plan.  Following your meal plan. Do not skip  meals. Eat on time.  Testing your blood glucose regularly. Check your blood glucose before and after exercise. If you exercise longer or different than usual, be sure to check blood glucose more frequently.  Wearing your medical alert jewelry that says you have diabetes.  Identify the cause of your hypoglycemia. Then, develop ways to prevent the recurrence of hypoglycemia.  Do not take a hot bath or shower right after an insulin shot.  Always carry treatment with you. Glucose tablets are the easiest to carry.  If you are going to drink alcohol, drink it only with meals.  Tell friends or family members ways to keep you safe during a seizure. This may include removing hard or sharp objects from the area or turning you on your side.  Maintain a healthy weight. SEEK MEDICAL CARE IF:   You are having problems keeping your blood glucose in your target range.  You are having frequent episodes of hypoglycemia.  You feel you might be having side effects from your medicines.  You are not sure why your blood glucose is dropping so low.  You notice a change in vision or a new problem with your vision. SEEK IMMEDIATE MEDICAL CARE IF:   Confusion develops.  A change in mental status occurs.  The inability to swallow develops.  Fainting occurs. Document Released: 03/01/2005 Document Revised: 03/06/2013 Document Reviewed: 06/28/2011 [facility] Patient Information 2015 [place], [place]. This information is not intended to replace advice given to you by your health care provider. Make sure you discuss any questions you have with your health care provider.   Urinary Tract Infection Urinary tract infections (UTIs) can develop anywhere along your urinary tract. Your urinary tract is your body's drainage system for removing wastes and extra water. Your urinary tract includes two kidneys, two ureters, a bladder, and a urethra. Your kidneys are a pair of bean-shaped organs. Each kidney is about the  size of your fist. They are located below your ribs, one on each side of your spine. CAUSES Infections are caused by microbes, which are microscopic organisms, including fungi, viruses, and bacteria. These organisms are so small that they can only be seen through a microscope. Bacteria are the microbes that most commonly cause UTIs. SYMPTOMS  Symptoms of UTIs may vary by age and gender of the patient and by the location of the infection. Symptoms in young women typically include a frequent and intense urge to urinate and a painful, burning feeling in the bladder or urethra during urination. Older women and men are more likely to be tired, shaky, and weak and have muscle aches and abdominal pain. A fever may mean the infection is in your kidneys. Other symptoms of a kidney infection include pain in your back or sides below the ribs, nausea, and vomiting. DIAGNOSIS To diagnose a UTI, your caregiver will ask you about your symptoms. Your caregiver also will ask to provide a urine sample. The urine sample will be tested for bacteria and white blood cells. White blood cells are made by your body to help fight infection. TREATMENT  Typically, UTIs can be treated with medication. Because most UTIs  are caused by a bacterial infection, they usually can be treated with the use of antibiotics. The choice of antibiotic and length of treatment depend on your symptoms and the type of bacteria causing your infection. HOME CARE INSTRUCTIONS  If you were prescribed antibiotics, take them exactly as your caregiver instructs you. Finish the medication even if you feel better after you have only taken some of the medication.  Drink enough water and fluids to keep your urine clear or pale yellow.  Avoid caffeine, tea, and carbonated beverages. They tend to irritate your bladder.  Empty your bladder often. Avoid holding urine for long periods of time.  Empty your bladder before and after sexual intercourse.  After a  bowel movement, women should cleanse from front to back. Use each tissue only once. SEEK MEDICAL CARE IF:   You have back pain.  You develop a fever.  Your symptoms do not begin to resolve within 3 days. SEEK IMMEDIATE MEDICAL CARE IF:   You have severe back pain or lower abdominal pain.  You develop chills.  You have nausea or vomiting.  You have continued burning or discomfort with urination. MAKE SURE YOU:   Understand these instructions.  Will watch your condition.  Will get help right away if you are not doing well or get worse. Document Released: 12/09/2004 Document Revised: 08/31/2011 Document Reviewed: 04/09/2011 [facility] Patient Information 2015 [place], [place]. This information is not intended to replace advice given to you by your health care provider. Make sure you discuss any questions you have with your health care provider.   Fall Prevention and Home Safety Falls cause injuries and can affect all age groups. It is possible to prevent falls.  HOW TO PREVENT FALLS  Wear shoes with rubber soles that do not have an opening for your toes.  Keep the inside and outside of your house well lit.  Use night lights throughout your home.  Remove clutter from floors.  Clean up floor spills.  Remove throw rugs or fasten them to the floor with carpet tape.  Do not place electrical cords across pathways.  Put grab bars by your tub, shower, and toilet. Do not use towel bars as grab bars.  Put handrails on both sides of the stairway. Fix loose handrails.  Do not climb on stools or stepladders, if possible.  Do not wax your floors.  Repair uneven or unsafe sidewalks, walkways, or stairs.  Keep items you use a lot within reach.  Be aware of pets.  Keep emergency numbers next to the telephone.  Put smoke detectors in your home and near bedrooms. Ask your doctor what other things you can do to prevent falls. Document Released: 12/26/2008 Document Revised:  08/31/2011 Document Reviewed: 06/01/2011 [facility] Patient Information 2015 [place], [place]. This information is not intended to replace advice given to you by your health care provider. Make sure you discuss any questions you have with your health care provider.

## 2014-11-26 NOTE — ED Notes (Signed)
CBG 110. 
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
## 2014-11-26 NOTE — ED Notes (Signed)
CBg 102

## 2014-11-26 NOTE — ED Notes (Signed)
Pureed diet ordered.

## 2014-11-26 NOTE — ED Notes (Signed)
Pt arrives via EMS from assisted living center where patient was walking and witnessed to have fallen on laminate floor. Neg LOC. Pt alert, oriented, per staff at baseline. VSS,

## 2014-11-26 NOTE — ED Notes (Signed)
PA at bedside.

## 2014-11-26 NOTE — ED Notes (Signed)
[facility] called. Tiffany from [facility] spoken to and given report.

## 2014-11-26 NOTE — ED Notes (Signed)
IV attempt x3 by this RN. Pt combative with staff. MD notified. New orders received.

## 2014-11-26 NOTE — ED Notes (Signed)
CBG 83 

## 2014-11-26 NOTE — ED Provider Notes (Signed)
89yF presenting after fall. Noted to be hypoglycemic today. On glipizide. Fourth evaluation for fall in past 4 months. Not much previous work-up on previous evaluation aside from imaging. Question wether hypoglycemia could have potentially contributed to fall today. Persistently hypoglycemic in ED despite numerous efforts to get her to eat. May need to be reconsidered if 89yF with poor PO intake should still be on this.  Medical screening examination/treatment/procedure(s) were conducted as a shared visit with non-physician practitioner(s) and myself.  I personally evaluated the patient during the encounter.   EKG Interpretation None       Raeford Razor, MD 11/26/14 [phone]

## 2014-11-28 LAB — HEMOGLOBIN A1C
Hgb A1c MFr Bld: 5.7 % — ABNORMAL HIGH (ref 4.8–5.6)
Mean Plasma Glucose: 117 mg/dL

## 2014-11-29 LAB — URINE CULTURE: Special Requests: NORMAL

## 2014-12-03 ENCOUNTER — Telehealth (HOSPITAL_COMMUNITY): Payer: Self-pay

## 2014-12-03 NOTE — Telephone Encounter (Signed)
Post ED Visit - Positive Culture Follow-up  Culture report reviewed by antimicrobial stewardship pharmacist:   Celedonio Miyamoto, Pharm.D., BCPS  Georgina Pillion, Pharm.D., BCPS  [place], [address].D., BCPS, AAHIVP  Estella Husk, Pharm.D., BCPS, AAHIVP  [employer], [address].D.  Tennis Must, [place].D.  Positive Urine culture, 100,000 colonies -> Klebsiella Pneumoniae Treated with Cephalexin, organism sensitive to the same and no further patient follow-up is required at this time.  Arvid Right 12/03/2014, 3:43 AM

## 2014-12-24 ENCOUNTER — Emergency Department (HOSPITAL_COMMUNITY)
Admission: EM | Admit: 2014-12-24 | Discharge: 2014-12-24 | Disposition: A | Discharge status: Home / Self Care | Payer: Medicare Other | Attending: Emergency Medicine | Admitting: Emergency Medicine

## 2014-12-24 ENCOUNTER — Encounter (HOSPITAL_COMMUNITY): Payer: Self-pay | Admitting: *Deleted

## 2014-12-24 ENCOUNTER — Emergency Department (HOSPITAL_COMMUNITY): Payer: Medicare Other

## 2014-12-24 DIAGNOSIS — Y998 Other external cause status: Secondary | ICD-10-CM | POA: Insufficient documentation

## 2014-12-24 DIAGNOSIS — S0083XA Contusion of other part of head, initial encounter: Secondary | ICD-10-CM | POA: Insufficient documentation

## 2014-12-24 DIAGNOSIS — Y9301 Activity, walking, marching and hiking: Secondary | ICD-10-CM | POA: Diagnosis not present

## 2014-12-24 DIAGNOSIS — G309 Alzheimer's disease, unspecified: Secondary | ICD-10-CM | POA: Diagnosis not present

## 2014-12-24 DIAGNOSIS — F0280 Dementia in other diseases classified elsewhere without behavioral disturbance: Secondary | ICD-10-CM | POA: Diagnosis not present

## 2014-12-24 DIAGNOSIS — R451 Restlessness and agitation: Secondary | ICD-10-CM | POA: Insufficient documentation

## 2014-12-24 DIAGNOSIS — Z7982 Long term (current) use of aspirin: Secondary | ICD-10-CM | POA: Insufficient documentation

## 2014-12-24 DIAGNOSIS — Z792 Long term (current) use of antibiotics: Secondary | ICD-10-CM | POA: Insufficient documentation

## 2014-12-24 DIAGNOSIS — I10 Essential (primary) hypertension: Secondary | ICD-10-CM | POA: Insufficient documentation

## 2014-12-24 DIAGNOSIS — W19XXXA Unspecified fall, initial encounter: Secondary | ICD-10-CM

## 2014-12-24 DIAGNOSIS — Y92128 Other place in nursing home as the place of occurrence of the external cause: Secondary | ICD-10-CM | POA: Diagnosis not present

## 2014-12-24 DIAGNOSIS — Z8739 Personal history of other diseases of the musculoskeletal system and connective tissue: Secondary | ICD-10-CM | POA: Diagnosis not present

## 2014-12-24 DIAGNOSIS — W1839XA Other fall on same level, initial encounter: Secondary | ICD-10-CM | POA: Diagnosis not present

## 2014-12-24 DIAGNOSIS — E7800 Pure hypercholesterolemia, unspecified: Secondary | ICD-10-CM | POA: Insufficient documentation

## 2014-12-24 DIAGNOSIS — S0990XA Unspecified injury of head, initial encounter: Secondary | ICD-10-CM | POA: Diagnosis present

## 2014-12-24 DIAGNOSIS — Z79899 Other long term (current) drug therapy: Secondary | ICD-10-CM | POA: Insufficient documentation

## 2014-12-24 LAB — BASIC METABOLIC PANEL
Anion gap: 6 (ref 5–15)
BUN: 23 mg/dL — ABNORMAL HIGH (ref 6–20)
CHLORIDE: 105 mmol/L (ref 101–111)
CO2: 31 mmol/L (ref 22–32)
Calcium: 10 mg/dL (ref 8.9–10.3)
Creatinine, Ser: 0.97 mg/dL (ref 0.44–1.00)
GFR calc non Af Amer: 50 mL/min — ABNORMAL LOW (ref >60)
GFR, EST AFRICAN AMERICAN: 57 mL/min — AB (ref >60)
Glucose, Bld: 110 mg/dL — ABNORMAL HIGH (ref 65–99)
POTASSIUM: 4.7 mmol/L (ref 3.5–5.1)
SODIUM: 142 mmol/L (ref 135–145)

## 2014-12-24 LAB — URINALYSIS, ROUTINE W REFLEX MICROSCOPIC
Bilirubin Urine: NEGATIVE
Glucose, UA: NEGATIVE mg/dL
Hgb urine dipstick: NEGATIVE
KETONES UR: NEGATIVE mg/dL
NITRITE: NEGATIVE
PROTEIN: NEGATIVE mg/dL
Specific Gravity, Urine: 1.008 (ref 1.005–1.030)
UROBILINOGEN UA: 0.2 mg/dL (ref 0.0–1.0)
pH: 6.5 (ref 5.0–8.0)

## 2014-12-24 LAB — URINE MICROSCOPIC-ADD ON

## 2014-12-24 LAB — CBC WITH DIFFERENTIAL/PLATELET
BASOS PCT: 0 %
Basophils Absolute: 0 10*3/uL (ref 0.0–0.1)
EOS ABS: 0.1 10*3/uL (ref 0.0–0.7)
Eosinophils Relative: 2 %
HCT: 35.9 % — ABNORMAL LOW (ref 36.0–46.0)
HEMOGLOBIN: 11.7 g/dL — AB (ref 12.0–15.0)
LYMPHS PCT: 56 %
Lymphs Abs: 2.7 10*3/uL (ref 0.7–4.0)
MCH: 31.3 pg (ref 26.0–34.0)
MCHC: 32.6 g/dL (ref 30.0–36.0)
MCV: 96 fL (ref 78.0–100.0)
MONOS PCT: 11 %
Monocytes Absolute: 0.5 10*3/uL (ref 0.1–1.0)
NEUTROS ABS: 1.5 10*3/uL — AB (ref 1.7–7.7)
Neutrophils Relative %: 31 %
Platelets: 199 10*3/uL (ref 150–400)
RBC: 3.74 MIL/uL — AB (ref 3.87–5.11)
RDW: 13.2 % (ref 11.5–15.5)
WBC: 4.8 10*3/uL (ref 4.0–10.5)

## 2014-12-24 LAB — CBG MONITORING, ED: GLUCOSE-CAPILLARY: 110 mg/dL — AB (ref 65–99)

## 2014-12-24 MED ORDER — HALOPERIDOL LACTATE 5 MG/ML IJ SOLN
INTRAMUSCULAR | Status: AC
  Filled 2014-12-24: qty 1

## 2014-12-24 MED ORDER — HALOPERIDOL 1 MG PO TABS
0.5000 mg | ORAL_TABLET | Freq: Once | ORAL | Status: AC
  Administered 2014-12-24: 0.5 mg via ORAL
  Filled 2014-12-24 (×2): qty 1

## 2014-12-24 NOTE — ED Notes (Signed)
Pt was found of floor today after an unwitnessed fall.  Denies pain, just has small bump to back of head.  Demented at baseline

## 2014-12-24 NOTE — ED Notes (Signed)
Still unable to get EKG.  Pt not cooperative.   MD Campose aware that pt did take 0.5 mg of Haldol.

## 2014-12-24 NOTE — ED Notes (Signed)
[facility] called for transport back to facility 

## 2014-12-24 NOTE — ED Notes (Signed)
Tiwann from [place] took report.  No new findings will send back to facility.

## 2014-12-24 NOTE — ED Notes (Signed)
Pt's CBG per EMS was 146.  Pt coming from [facility] with hx of dementia and is only oriented to self.

## 2014-12-24 NOTE — ED Notes (Signed)
Bed: WA21 Expected date:  Expected time:  Means of arrival:  Comments: EMS  

## 2014-12-24 NOTE — ED Notes (Signed)
Came into room and pt up at side of bed trying to adjust the bed.  Pt disoriented x 3. Alert only to self.  Pt coaxed back to bed and safety sitter placed at bedside.  Pt verbally aggressive.

## 2014-12-24 NOTE — ED Provider Notes (Signed)
CSN: [id number]     Arrival date & time 12/24/14  1516 History   First MD Initiated Contact with Patient 12/24/14 1524     Chief Complaint  Patient presents with  . Fall    Unwitnessed   Level 5 caveat - Dementia  HPI  Pamela Ramirez is a 81 year old female with PMHx of Alzheimer's presenting after an unwitnessed fall. Pt's nursing home, [facility], states that she was walking in the halls when the nurses heard her fall. The fall was unwitnessed and she was down for approximately 5 seconds before staff reached her. They state she has been at her baseline over the past few days. She is severely demented, combative and is not conversational. Her nurse denies recent fevers, decreased appetite, decreased activity or other changes in her demeanor. Staff reports that she did not indicate any pain after the fall.   Past Medical History  Diagnosis Date  . Hypertension   . Hypercholesterolemia   . Osteoporosis   . Tendonitis   . Vitamin D deficiency   . Alzheimer disease    History reviewed. No pertinent past surgical history. No family history on file. Social History  Substance Use Topics  . Smoking status: Never Smoker   . Smokeless tobacco: None  . Alcohol Use: No   OB History    No data available     Review of Systems  Unable to perform ROS Dementia    Allergies  Review of patient's allergies indicates no known allergies.  Home Medications   Prior to Admission medications   Medication Sig Start Date End Date Taking? Authorizing Provider  acetaminophen (TYLENOL) 500 MG tablet Take 500 mg by mouth every 4 (four) hours as needed for mild pain, moderate pain, fever or headache.   Yes Historical Provider, MD  acetaminophen (TYLENOL) 650 MG CR tablet Take 650 mg by mouth 3 (three) times daily.   Yes Historical Provider, MD  ALPRAZolam (XANAX) 0.25 MG tablet Take 0.25 mg by mouth 2 (two) times daily.   Yes Historical Provider, MD  alum & mag hydroxide-simeth (MAALOX/MYLANTA)  200-200-20 MG/5ML suspension Take 30 mLs by mouth every 6 (six) hours as needed for indigestion or heartburn.   Yes Historical Provider, MD  aspirin 81 MG chewable tablet Chew 81 mg by mouth daily.   Yes Historical Provider, MD  atorvastatin (LIPITOR) 20 MG tablet Take 20 mg by mouth at bedtime.    Yes Historical Provider, MD  divalproex (DEPAKOTE SPRINKLE) 125 MG capsule Take 250 mg by mouth 3 (three) times daily.   Yes Historical Provider, MD  guaifenesin (ROBITUSSIN) 100 MG/5ML syrup Take 200 mg by mouth every 6 (six) hours as needed for cough.   Yes Historical Provider, MD  haloperidol (HALDOL) 0.5 MG tablet Take 0.5 mg by mouth at bedtime.    Yes Historical Provider, MD  lisinopril (PRINIVIL,ZESTRIL) 10 MG tablet Take 10 mg by mouth daily.   Yes Historical Provider, MD  loperamide (IMODIUM) 2 MG capsule Take 2 mg by mouth as needed for diarrhea or loose stools (take 1 capsule as needed with each loose stool for diarrhea (not to exceed 8 doses in 24 hours)).    Yes Historical Provider, MD  magnesium hydroxide (MILK OF MAGNESIA) 400 MG/5ML suspension Take 30 mLs by mouth at bedtime as needed for mild constipation or moderate constipation.   Yes Historical Provider, MD  Multiple Vitamins-Minerals (MULTIVITAMINS THER. W/MINERALS) TABS Take 1 tablet by mouth daily.   Yes Historical Provider,  MD  neomycin-bacitracin-polymyxin (NEOSPORIN) [phone] ointment Apply 1 application topically as needed (apply to minor skin tears or abrasions, cover with band aid/gauze and tape. change as needed until healed).   Yes Historical Provider, MD  Nutritional Supplements (NUTRITIONAL DRINK) LIQD Take 1 Bottle by mouth 3 (three) times daily.    Yes Historical Provider, MD  rivastigmine (EXELON) 1.5 MG capsule Take 1.5 mg by mouth 2 (two) times daily.   Yes Historical Provider, MD  Vitamins-Lipotropics (LIPO-FLAVONOID PLUS PO) Take 1 tablet by mouth daily.   Yes Historical Provider, MD  cephALEXin (KEFLEX) 500 MG  capsule Take 1 capsule (500 mg total) by mouth 3 (three) times daily. 11/26/14   Danelle Berry, PA-C  glipiZIDE (GLUCOTROL) 5 MG tablet Take 5 mg by mouth daily before breakfast.    Historical Provider, MD   BP 156/74 mmHg  Pulse 69  Temp(Src) 97.3 F (36.3 C) (Axillary)  Resp 18  SpO2 100% Physical Exam  Constitutional: She appears well-developed and well-nourished. No distress.  HENT:  Head: Normocephalic and atraumatic.  Hematoma of posterior occiput  Eyes: Conjunctivae are normal. Pupils are equal, round, and reactive to light. Right eye exhibits no discharge. Left eye exhibits no discharge. No scleral icterus.  Neck: Normal range of motion.  Cardiovascular: Normal rate and regular rhythm.   Pulmonary/Chest: Effort normal. No respiratory distress.  Musculoskeletal: Normal range of motion.  No obvious bony deformities. Moves all extremities spontaneously. No TTP of BUE and BLE joints. No edema of joints noted.   Neurological: She is alert. Coordination normal.  Would not follow commands for neuro testing. PERRL.   Skin: Skin is warm and dry.  Psychiatric: Her affect is angry. She is agitated and combative. Cognition and memory are impaired. She exhibits abnormal recent memory and abnormal remote memory.  Nursing note and vitals reviewed.   ED Course  Procedures (including critical care time) Labs Review Labs Reviewed  CBC WITH DIFFERENTIAL/PLATELET - Abnormal; Notable for the following:    RBC 3.74 (*)    Hemoglobin 11.7 (*)    HCT 35.9 (*)    Neutro Abs 1.5 (*)    All other components within normal limits  BASIC METABOLIC PANEL - Abnormal; Notable for the following:    Glucose, Bld 110 (*)    BUN 23 (*)    GFR calc non Af Amer 50 (*)    GFR calc Af Amer 57 (*)    All other components within normal limits  URINALYSIS, ROUTINE W REFLEX MICROSCOPIC (NOT AT [facility]) - Abnormal; Notable for the following:    Leukocytes, UA SMALL (*)    All other components within normal limits   CBG MONITORING, ED - Abnormal; Notable for the following:    Glucose-Capillary 110 (*)    All other components within normal limits  URINE MICROSCOPIC-ADD ON    Imaging Review Ct Head Wo Contrast  12/24/2014  CLINICAL DATA:  Unwitnessed fall.  Found on the floor. EXAM: CT HEAD WITHOUT CONTRAST CT CERVICAL SPINE WITHOUT CONTRAST TECHNIQUE: Multidetector CT imaging of the head and cervical spine was performed following the standard protocol without intravenous contrast. Multiplanar CT image reconstructions of the cervical spine were also generated. COMPARISON:  11/26/2014 FINDINGS: CT HEAD FINDINGS There is no evidence of mass effect, midline shift, or extra-axial fluid collections. There is no evidence of a space-occupying lesion or intracranial hemorrhage. There is no evidence of a cortical-based area of acute infarction. There is generalized cerebral atrophy. There is periventricular white matter low  attenuation likely secondary to microangiopathy. The ventricles and sulci are appropriate for the patient's age. The basal cisterns are patent. Visualized portions of the orbits are unremarkable. There is bilateral frontal sinus mucosal thickening. The mastoid sinuses are clear. Cerebrovascular atherosclerotic calcifications are noted. The osseous structures are unremarkable. CT CERVICAL SPINE FINDINGS The alignment is anatomic. The vertebral body heights are maintained. There is no acute fracture. There is no static listhesis. The prevertebral soft tissues are normal. The intraspinal soft tissues are not fully imaged on this examination due to poor soft tissue contrast, but there is no gross soft tissue abnormality. There is degenerative disc disease of the cervical spine most severe at C3-4 with disc space narrowing. There is a broad-based disc osteophyte complex at C3-4 with bilateral uncovertebral degenerative changes and mild foraminal narrowing. The visualized portions of the lung apices demonstrate no  focal abnormality. There is bilateral carotid artery atherosclerosis. IMPRESSION: 1. No acute intracranial pathology. 2. No acute osseous injury of the cervical spine. Electronically Signed   By: Elige Ko   On: 12/24/2014 17:22   Ct Cervical Spine Wo Contrast  12/24/2014  CLINICAL DATA:  Unwitnessed fall.  Found on the floor. EXAM: CT HEAD WITHOUT CONTRAST CT CERVICAL SPINE WITHOUT CONTRAST TECHNIQUE: Multidetector CT imaging of the head and cervical spine was performed following the standard protocol without intravenous contrast. Multiplanar CT image reconstructions of the cervical spine were also generated. COMPARISON:  11/26/2014 FINDINGS: CT HEAD FINDINGS There is no evidence of mass effect, midline shift, or extra-axial fluid collections. There is no evidence of a space-occupying lesion or intracranial hemorrhage. There is no evidence of a cortical-based area of acute infarction. There is generalized cerebral atrophy. There is periventricular white matter low attenuation likely secondary to microangiopathy. The ventricles and sulci are appropriate for the patient's age. The basal cisterns are patent. Visualized portions of the orbits are unremarkable. There is bilateral frontal sinus mucosal thickening. The mastoid sinuses are clear. Cerebrovascular atherosclerotic calcifications are noted. The osseous structures are unremarkable. CT CERVICAL SPINE FINDINGS The alignment is anatomic. The vertebral body heights are maintained. There is no acute fracture. There is no static listhesis. The prevertebral soft tissues are normal. The intraspinal soft tissues are not fully imaged on this examination due to poor soft tissue contrast, but there is no gross soft tissue abnormality. There is degenerative disc disease of the cervical spine most severe at C3-4 with disc space narrowing. There is a broad-based disc osteophyte complex at C3-4 with bilateral uncovertebral degenerative changes and mild foraminal  narrowing. The visualized portions of the lung apices demonstrate no focal abnormality. There is bilateral carotid artery atherosclerosis. IMPRESSION: 1. No acute intracranial pathology. 2. No acute osseous injury of the cervical spine. Electronically Signed   By: Elige Ko   On: 12/24/2014 17:22   I have personally reviewed and evaluated these images and lab results as part of my medical decision-making.   EKG Interpretation None      MDM   Final diagnoses:  Fall   Pt presenting after an unwitnessed fall. Pt with severe dementia and is agitated and combative. Discusse pt with [facility] who reports she is at her baseline. VSS. Pt will not follow commands for examination and does not answer questions. Moves all extremities spontaneously, no obvious deformities. Hematoma over posterior occiput. Head and neck CT unremarkable. Bloodwork and UA reassuring. Tried multiple times to obtain ECG but pt very combative and would not cooperate. Gave home dose of haldol  and attempted ECG again without success. Pt appears to be at baseline and is stable for transport back to facility. Return precautions given in discharge paperwork. Pt stable for discharge     Alveta Heimlich, PA-C 12/25/14 1153  Azalia Bilis, MD 12/27/14 [phone]

## 2014-12-24 NOTE — ED Notes (Signed)
Writer tried to perform an EKG, pt refused

## 2014-12-24 NOTE — ED Notes (Signed)
PTAR at bedside for transport.  

## 2014-12-24 NOTE — ED Notes (Signed)
PA Stevie aware of pt's aggression and irritation.  Per PA this is baseline.

## 2015-01-25 ENCOUNTER — Observation Stay (HOSPITAL_COMMUNITY)
Admission: EM | Admit: 2015-01-25 | Discharge: 2015-01-27 | Disposition: A | Discharge status: Skilled Nursing Facility | Payer: Medicare Other | Attending: Internal Medicine | Admitting: Internal Medicine

## 2015-01-25 ENCOUNTER — Emergency Department (HOSPITAL_COMMUNITY): Payer: Medicare Other

## 2015-01-25 ENCOUNTER — Encounter (HOSPITAL_COMMUNITY): Payer: Self-pay | Admitting: Emergency Medicine

## 2015-01-25 DIAGNOSIS — D631 Anemia in chronic kidney disease: Secondary | ICD-10-CM | POA: Diagnosis not present

## 2015-01-25 DIAGNOSIS — Z7982 Long term (current) use of aspirin: Secondary | ICD-10-CM | POA: Insufficient documentation

## 2015-01-25 DIAGNOSIS — G9340 Encephalopathy, unspecified: Secondary | ICD-10-CM | POA: Diagnosis not present

## 2015-01-25 DIAGNOSIS — E785 Hyperlipidemia, unspecified: Secondary | ICD-10-CM | POA: Diagnosis not present

## 2015-01-25 DIAGNOSIS — F0280 Dementia in other diseases classified elsewhere without behavioral disturbance: Secondary | ICD-10-CM | POA: Insufficient documentation

## 2015-01-25 DIAGNOSIS — I129 Hypertensive chronic kidney disease with stage 1 through stage 4 chronic kidney disease, or unspecified chronic kidney disease: Secondary | ICD-10-CM | POA: Insufficient documentation

## 2015-01-25 DIAGNOSIS — A419 Sepsis, unspecified organism: Secondary | ICD-10-CM | POA: Insufficient documentation

## 2015-01-25 DIAGNOSIS — I959 Hypotension, unspecified: Secondary | ICD-10-CM

## 2015-01-25 DIAGNOSIS — T68XXXA Hypothermia, initial encounter: Secondary | ICD-10-CM | POA: Diagnosis not present

## 2015-01-25 DIAGNOSIS — N183 Chronic kidney disease, stage 3 unspecified: Secondary | ICD-10-CM | POA: Diagnosis present

## 2015-01-25 DIAGNOSIS — E7800 Pure hypercholesterolemia, unspecified: Secondary | ICD-10-CM | POA: Insufficient documentation

## 2015-01-25 DIAGNOSIS — Z86711 Personal history of pulmonary embolism: Secondary | ICD-10-CM | POA: Diagnosis not present

## 2015-01-25 DIAGNOSIS — R748 Abnormal levels of other serum enzymes: Secondary | ICD-10-CM | POA: Diagnosis not present

## 2015-01-25 DIAGNOSIS — Z79899 Other long term (current) drug therapy: Secondary | ICD-10-CM | POA: Insufficient documentation

## 2015-01-25 DIAGNOSIS — G309 Alzheimer's disease, unspecified: Secondary | ICD-10-CM | POA: Diagnosis not present

## 2015-01-25 DIAGNOSIS — N189 Chronic kidney disease, unspecified: Secondary | ICD-10-CM

## 2015-01-25 DIAGNOSIS — R296 Repeated falls: Secondary | ICD-10-CM | POA: Insufficient documentation

## 2015-01-25 DIAGNOSIS — R0682 Tachypnea, not elsewhere classified: Secondary | ICD-10-CM | POA: Diagnosis not present

## 2015-01-25 DIAGNOSIS — F0390 Unspecified dementia without behavioral disturbance: Secondary | ICD-10-CM | POA: Diagnosis present

## 2015-01-25 DIAGNOSIS — I2699 Other pulmonary embolism without acute cor pulmonale: Secondary | ICD-10-CM | POA: Diagnosis present

## 2015-01-25 LAB — URINALYSIS, ROUTINE W REFLEX MICROSCOPIC
BILIRUBIN URINE: NEGATIVE
Glucose, UA: NEGATIVE mg/dL
Hgb urine dipstick: NEGATIVE
KETONES UR: NEGATIVE mg/dL
Leukocytes, UA: NEGATIVE
NITRITE: NEGATIVE
Protein, ur: NEGATIVE mg/dL
Specific Gravity, Urine: 1.021 (ref 1.005–1.030)
UROBILINOGEN UA: 0.2 mg/dL (ref 0.0–1.0)
pH: 6.5 (ref 5.0–8.0)

## 2015-01-25 LAB — CBC WITH DIFFERENTIAL/PLATELET
BASOS ABS: 0 10*3/uL (ref 0.0–0.1)
Basophils Relative: 0 %
EOS PCT: 2 %
Eosinophils Absolute: 0.1 10*3/uL (ref 0.0–0.7)
HCT: 34.1 % — ABNORMAL LOW (ref 36.0–46.0)
Hemoglobin: 11.2 g/dL — ABNORMAL LOW (ref 12.0–15.0)
LYMPHS PCT: 38 %
Lymphs Abs: 1.9 10*3/uL (ref 0.7–4.0)
MCH: 31.8 pg (ref 26.0–34.0)
MCHC: 32.8 g/dL (ref 30.0–36.0)
MCV: 96.9 fL (ref 78.0–100.0)
MONO ABS: 0.5 10*3/uL (ref 0.1–1.0)
Monocytes Relative: 10 %
Neutro Abs: 2.5 10*3/uL (ref 1.7–7.7)
Neutrophils Relative %: 50 %
PLATELETS: 179 10*3/uL (ref 150–400)
RBC: 3.52 MIL/uL — ABNORMAL LOW (ref 3.87–5.11)
RDW: 13.9 % (ref 11.5–15.5)
WBC: 5 10*3/uL (ref 4.0–10.5)

## 2015-01-25 LAB — TROPONIN I: Troponin I: 0.03 ng/mL (ref <0.031)

## 2015-01-25 LAB — COMPREHENSIVE METABOLIC PANEL
ALT: 41 U/L (ref 14–54)
ANION GAP: 7 (ref 5–15)
AST: 29 U/L (ref 15–41)
Albumin: 3.5 g/dL (ref 3.5–5.0)
Alkaline Phosphatase: 59 U/L (ref 38–126)
BUN: 30 mg/dL — AB (ref 6–20)
CHLORIDE: 107 mmol/L (ref 101–111)
CO2: 28 mmol/L (ref 22–32)
Calcium: 9.4 mg/dL (ref 8.9–10.3)
Creatinine, Ser: 1.18 mg/dL — ABNORMAL HIGH (ref 0.44–1.00)
GFR, EST AFRICAN AMERICAN: 45 mL/min — AB (ref >60)
GFR, EST NON AFRICAN AMERICAN: 39 mL/min — AB (ref >60)
Glucose, Bld: 93 mg/dL (ref 65–99)
POTASSIUM: 4.8 mmol/L (ref 3.5–5.1)
Sodium: 142 mmol/L (ref 135–145)
Total Bilirubin: 0.5 mg/dL (ref 0.3–1.2)
Total Protein: 6.4 g/dL — ABNORMAL LOW (ref 6.5–8.1)

## 2015-01-25 LAB — I-STAT CG4 LACTIC ACID, ED: LACTIC ACID, VENOUS: 1.25 mmol/L (ref 0.5–2.0)

## 2015-01-25 LAB — TSH: TSH: 1.462 u[IU]/mL (ref 0.350–4.500)

## 2015-01-25 MED ORDER — SODIUM CHLORIDE 0.9 % IV SOLN
INTRAVENOUS | Status: DC
  Administered 2015-01-25 (×2): via INTRAVENOUS

## 2015-01-25 MED ORDER — LEVOFLOXACIN IN D5W 750 MG/150ML IV SOLN
750.0000 mg | INTRAVENOUS | Status: DC
  Administered 2015-01-25: 750 mg via INTRAVENOUS
  Filled 2015-01-25 (×2): qty 150

## 2015-01-25 MED ORDER — VANCOMYCIN HCL 10 G IV SOLR
1500.0000 mg | Freq: Once | INTRAVENOUS | Status: AC
  Administered 2015-01-25: 1500 mg via INTRAVENOUS
  Filled 2015-01-25: qty 1500

## 2015-01-25 MED ORDER — IOHEXOL 350 MG/ML SOLN
75.0000 mL | Freq: Once | INTRAVENOUS | Status: AC | PRN
  Administered 2015-01-25: 75 mL via INTRAVENOUS

## 2015-01-25 MED ORDER — ATORVASTATIN CALCIUM 20 MG PO TABS
20.0000 mg | ORAL_TABLET | Freq: Every day | ORAL | Status: DC
  Administered 2015-01-25 – 2015-01-26 (×2): 20 mg via ORAL
  Filled 2015-01-25 (×2): qty 1

## 2015-01-25 MED ORDER — PIPERACILLIN-TAZOBACTAM 3.375 G IVPB 30 MIN
3.3750 g | Freq: Once | INTRAVENOUS | Status: AC
  Administered 2015-01-25: 3.375 g via INTRAVENOUS
  Filled 2015-01-25: qty 50

## 2015-01-25 MED ORDER — SODIUM CHLORIDE 0.9 % IV SOLN
INTRAVENOUS | Status: DC
  Administered 2015-01-25 – 2015-01-26 (×2): via INTRAVENOUS

## 2015-01-25 MED ORDER — SODIUM CHLORIDE 0.9 % IV BOLUS (SEPSIS)
500.0000 mL | Freq: Once | INTRAVENOUS | Status: AC
  Administered 2015-01-25: 500 mL via INTRAVENOUS

## 2015-01-25 MED ORDER — ASPIRIN 81 MG PO CHEW
81.0000 mg | CHEWABLE_TABLET | Freq: Every day | ORAL | Status: DC
  Administered 2015-01-26 – 2015-01-27 (×2): 81 mg via ORAL
  Filled 2015-01-25 (×3): qty 1

## 2015-01-25 MED ORDER — HYDROCORTISONE NA SUCCINATE PF 100 MG IJ SOLR
100.0000 mg | Freq: Once | INTRAMUSCULAR | Status: AC
  Administered 2015-01-25: 100 mg via INTRAVENOUS
  Filled 2015-01-25: qty 2

## 2015-01-25 MED ORDER — SODIUM CHLORIDE 0.9 % IV BOLUS (SEPSIS): 1500.0000 mL | Freq: Once | INTRAVENOUS | Status: DC

## 2015-01-25 MED ORDER — ENOXAPARIN SODIUM 40 MG/0.4ML ~~LOC~~ SOLN
40.0000 mg | SUBCUTANEOUS | Status: DC
  Administered 2015-01-25 – 2015-01-26 (×2): 40 mg via SUBCUTANEOUS
  Filled 2015-01-25 (×2): qty 0.4

## 2015-01-25 MED ORDER — SODIUM CHLORIDE 0.9 % IV BOLUS (SEPSIS)
1000.0000 mL | Freq: Once | INTRAVENOUS | Status: AC
  Administered 2015-01-25: 1000 mL via INTRAVENOUS

## 2015-01-25 NOTE — ED Notes (Signed)
Main Lab called to draw 2nd set of Blood Cultures

## 2015-01-25 NOTE — Progress Notes (Signed)
ANTIBIOTIC CONSULT NOTE - INITIAL  Pharmacy Consult for Levaquin Indication: CAP  No Known Allergies  Patient Measurements:    Vital Signs: Temp: 95.8 F (35.4 C) (11/12 1606) Temp Source: Rectal (11/12 1606) BP: 112/61 mmHg (11/12 1907) Pulse Rate: 89 (11/12 1915) Intake/Output from previous day:   Intake/Output from this shift:    Labs:  Recent Labs  01/25/15 1450  WBC 5.0  HGB 11.2*  PLT 179  CREATININE 1.18*   CrCl cannot be calculated (Unknown ideal weight.). No results for input(s): VANCOTROUGH, VANCOPEAK, VANCORANDOM, GENTTROUGH, GENTPEAK, GENTRANDOM, TOBRATROUGH, TOBRAPEAK, TOBRARND, AMIKACINPEAK, AMIKACINTROU, AMIKACIN in the last 72 hours.   Microbiology: No results found for this or any previous visit (from the past 720 hour(s)).  Medical History: Past Medical History  Diagnosis Date  . Hypertension   . Hypercholesterolemia   . Osteoporosis   . Tendonitis   . Vitamin D deficiency   . Alzheimer disease     Medications:  Scheduled:  . aspirin  81 mg Oral Daily  . atorvastatin  20 mg Oral QHS  . enoxaparin (LOVENOX) injection  40 mg Subcutaneous Q24H   Infusions:  . sodium chloride 125 mL/hr (01/25/15 1607)  . sodium chloride     PRN:   Assessment: 81 yo female with dementia found unresponsive at [facility]. Sepsis criteria met with hypotension, hypothermia. IVF bolus given, blood cx x 1 obtained and vancomycin and zosyn x 1 ordered by EDP. On admission, Levaquin per Rx ordered for CAP.  11/12 >> Vanc x 1 11/12 >> Zosyn x 1 11/12 >> Levaquin >>  11/12 blood x 2: 11/12 urine:  Temp: 95.8 WBC: 5k SCr 1.18, CrCl 35 ml/min/1.47m (normalized)  Goal of Therapy:  Dosage appropriate for indication and renal function Eradication of infection  Plan:   Levaquin 7581mIV q48h Follow up renal function & cultures, clinical course Monitor QTc - 46523m on EKG today  EriPeggyann JubaharmD, BCPS Pager: [phone]/02/2015,7:29 PM

## 2015-01-25 NOTE — ED Notes (Signed)
[facility] of [facility] and not fighting as normal (dementia/nonverbal is normal) upon assessing during lunch rounds; upon EMS arrival pt VS hypotensive otherwise normal; with insertion of IV pt combative and "back to baseline."

## 2015-01-25 NOTE — ED Provider Notes (Signed)
MSE was initiated and I personally evaluated the patient and placed orders (if any) at  2:36 PM on January 25, 2015.  The patient appears stable so that the remainder of the MSE may be completed by another provider.  Patient here from nursing home after being found unresponsive. EMS arrived and patient was responsive except that she had a low blood pressure. Given IV fluids and is now back to her baseline. Workup initiated  Lorre NickAnthony Kwane Rohl, MD 01/25/15 1436

## 2015-01-25 NOTE — ED Notes (Signed)
Bed: RESA Expected date: 01/25/15 Expected time: 2:04 PM Means of arrival: Ambulance Comments: Unresponsive, 60/p, Resp 10

## 2015-01-25 NOTE — H&P (Signed)
.             History and Physical  Pamela Ramirez RUE:[id number]:[id number] DOB: 1922/03/02 DOA: 01/25/2015  PCP: Pcp Not In System   Chief Complaint: AMS  History of Present Illness:  - Patient is a 81 yo female with history of dementia ( non verbal at baseline), PE, HTN, who was brought by EMS from NH due to altered mental status ( she is non verbal at baseline but still responds to pain stimuli and look at people around her, sometime combative when forced to eat but this changed today).  - Patient can't give any history. Daughters said they visit her almost daily in her NH and yesterday she was at baseline with no complaints at all and no change in daily routine: no vomiting, diarrhea, wheezing, cough, fever, chills, or bleeding.  - She has history of falls but they were told she did not fall today. EMS found her to be hypotensive. In the ER she was found hypotensive and with RR of 10. After fluid resuscitation, she went back to baseline including normalization of her blood pressure.  - She got vanc/zosyn in the ER for possible sepsis.   Review of Systems:  Unable to provide due to dementia. Per daughters she has not had any vomiting, pain, cough, wheezing, fever or chills.   Other:  Past Medical and Surgical History:   Past Medical History  Diagnosis Date  . Hypertension   . Hypercholesterolemia   . Osteoporosis   . Tendonitis   . Vitamin D deficiency   . Alzheimer disease    History reviewed. No pertinent past surgical history.  Social History:   reports that she has never smoked. She does not have any smokeless tobacco history on file. She reports that she does not drink alcohol or use illicit drugs.   No Known Allergies  FH:  HTN  Prior to Admission medications   Medication Sig Start Date End Date Taking? Authorizing Provider  acetaminophen (TYLENOL) 500 MG tablet Take 500 mg by mouth every 4 (four) hours as needed for mild pain, moderate pain, fever or headache.   Yes  Historical Provider, MD  acetaminophen (TYLENOL) 650 MG CR tablet Take 650 mg by mouth 3 (three) times daily.   Yes Historical Provider, MD  ALPRAZolam (XANAX) 0.25 MG tablet Take 0.25 mg by mouth 2 (two) times daily.   Yes Historical Provider, MD  alum & mag hydroxide-simeth (MAALOX/MYLANTA) 200-200-20 MG/5ML suspension Take 30 mLs by mouth every 6 (six) hours as needed for indigestion or heartburn.   Yes Historical Provider, MD  aspirin 81 MG chewable tablet Chew 81 mg by mouth daily.   Yes Historical Provider, MD  atorvastatin (LIPITOR) 20 MG tablet Take 20 mg by mouth at bedtime.    Yes Historical Provider, MD  divalproex (DEPAKOTE SPRINKLE) 125 MG capsule Take 250 mg by mouth 3 (three) times daily.   Yes Historical Provider, MD  guaifenesin (ROBITUSSIN) 100 MG/5ML syrup Take 200 mg by mouth every 6 (six) hours as needed for cough.   Yes Historical Provider, MD  haloperidol (HALDOL) 0.5 MG tablet Take 0.5 mg by mouth at bedtime.    Yes Historical Provider, MD  lisinopril (PRINIVIL,ZESTRIL) 10 MG tablet Take 10 mg by mouth daily.   Yes Historical Provider, MD  loperamide (IMODIUM) 2 MG capsule Take 2 mg by mouth as needed for diarrhea or loose stools (take 1 capsule as needed with each loose stool for diarrhea (not to exceed  8 doses in 24 hours)).    Yes Historical Provider, MD  magnesium hydroxide (MILK OF MAGNESIA) 400 MG/5ML suspension Take 30 mLs by mouth at bedtime as needed for mild constipation or moderate constipation.   Yes Historical Provider, MD  Multiple Vitamins-Minerals (MULTIVITAMINS THER. W/MINERALS) TABS Take 1 tablet by mouth daily.   Yes Historical Provider, MD  neomycin-bacitracin-polymyxin (NEOSPORIN) [phone] ointment Apply 1 application topically as needed (apply to minor skin tears or abrasions, cover with band aid/gauze and tape. change as needed until healed).   Yes Historical Provider, MD  Nutritional Supplements (NUTRITIONAL DRINK) LIQD Take 1 Bottle by mouth 3 (three)  times daily.    Yes Historical Provider, MD  rivastigmine (EXELON) 1.5 MG capsule Take 3 mg by mouth 2 (two) times daily.    Yes Historical Provider, MD  Vitamins-Lipotropics (LIPO-FLAVONOID PLUS PO) Take 1 tablet by mouth daily.   Yes Historical Provider, MD  cephALEXin (KEFLEX) 500 MG capsule Take 1 capsule (500 mg total) by mouth 3 (three) times daily. Patient not taking: Reported on 01/25/2015 11/26/14   Danelle Berry, PA-C    Physical Exam: BP 112/61 mmHg  Pulse 89  Temp(Src) 95.8 F (35.4 C) (Rectal)  Resp 14  SpO2 92%  GENERAL :  appears to be in no acute distress. Resisting exam  HEAD: normocephalic. EYES: PERRL, EOMI.  NOSE: No nasal discharge. THROAT: resisting opening of her mouth, trying to bite ( baseline per daughter) NECK: Neck supple, CARDIAC: Normal S1 and S2. No S3, S4 or murmurs. Rhythm is regular. There is no peripheral edema LUNGS: Clear to auscultation and percussion  ABDOMEN: Positive bowel sounds. Soft, nondistended, nontender. No guarding or rebound. No masses. EXTREMITIES: No significant deformity or joint abnormality.  NEUROLOGICAL: Oriented # 0, can't assess neurologic exam, respond to pain and follow with her eyes ( baseline per daughter).  SKIN: skin appears normal , resist exam of bedsores, none exist per family.           Labs on Admission:  Reviewed.   Radiological Exams on Admission: Ct Angio Chest Pe W/cm &/or Wo Cm  01/25/2015  CLINICAL DATA:  Acute onset of altered mental status and hypotension. Personal history of pulmonary embolus, not currently on anticoagulation. Initial encounter. EXAM: CT ANGIOGRAPHY CHEST CT ABDOMEN AND PELVIS WITH CONTRAST TECHNIQUE: Multidetector CT imaging of the chest was performed using the standard protocol during bolus administration of intravenous contrast. Multiplanar CT image reconstructions and MIPs were obtained to evaluate the vascular anatomy. Multidetector CT imaging of the abdomen and pelvis was performed  using the standard protocol during bolus administration of intravenous contrast. CONTRAST:  75mL OMNIPAQUE IOHEXOL 350 MG/ML SOLN COMPARISON:  Chest radiograph performed earlier today at 3:36 p.m., and pelvic radiograph performed 11/20/2013; CTA of the chest performed 05/11/2011 FINDINGS: CTA CHEST FINDINGS There is no evidence of pulmonary embolus. The previously noted chronic web within the pulmonary artery to the right lower lobe has largely resolved. Mild peripheral opacities at the lung bases may reflect atelectasis or scarring. Nodular densities along the right major fissure likely reflect lymph nodes. There is minimal nodularity within the posterior aspects of the upper lobes bilaterally, which may reflect a mild atypical infectious process. There is no evidence of significant focal consolidation, pleural effusion or pneumothorax. Visualized mediastinal nodes remain normal in size. Scattered coronary artery calcifications are seen. No pericardial effusion is identified. Scattered calcification is noted along the aortic arch. The great vessels are grossly unremarkable. No axillary lymphadenopathy is seen. The  visualized portions of the thyroid gland are unremarkable in appearance. No acute osseous abnormalities are seen. CT ABDOMEN and PELVIS FINDINGS Scattered hypodensities within the liver, measuring up to 2.4 cm, are similar in appearance to 2013 and likely reflect cysts. The spleen is unremarkable in appearance. The gallbladder is within normal limits. The pancreas and adrenal glands are unremarkable. Large left renal cysts are noted, measuring up to 6.0 cm in size. A tiny right renal cyst is seen. There is no evidence of hydronephrosis. No renal or ureteral stones are seen. No perinephric stranding is appreciated. No free fluid is identified. The small bowel is unremarkable in appearance. The stomach is within normal limits. No acute vascular abnormalities are seen. There is mild ectasia of the infrarenal  abdominal aorta, without evidence of aneurysmal dilatation. Scattered calcification is noted along the abdominal aorta and its branches. The appendix is not definitely characterized; there is no evidence of appendicitis. Minimal diverticulosis is noted along the proximal sigmoid colon. The colon is otherwise unremarkable. The bladder is mildly distended and grossly unremarkable. The uterus is unremarkable in appearance. The ovaries are grossly symmetric. No suspicious adnexal masses are seen. No inguinal lymphadenopathy is seen. No acute osseous abnormalities are identified. Review of the MIP images confirms the above findings. IMPRESSION: 1. No evidence of pulmonary embolus. 2. Minimal nodularity within the posterior aspects of the upper lung lobes bilaterally, which may reflect a mild atypical infectious process, depending on the patient's symptoms. 3. Mild peripheral opacities in the lung bases may reflect atelectasis or scarring. 4. Scattered coronary artery calcification noted. 5. Scattered hypodensities within the liver appear relatively stable and likely reflect cysts, though not fully imaged on the prior study. 6. Large left renal cysts noted.  Tiny right renal cyst seen. 7. Scattered calcification along the abdominal aorta and its branches. 8. Minimal diverticulosis along the proximal sigmoid colon, without evidence of diverticulitis. Electronically Signed   By: Roanna Raider M.D.   On: 01/25/2015 18:19   Ct Abdomen Pelvis W Contrast  01/25/2015  CLINICAL DATA:  Acute onset of altered mental status and hypotension. Personal history of pulmonary embolus, not currently on anticoagulation. Initial encounter. EXAM: CT ANGIOGRAPHY CHEST CT ABDOMEN AND PELVIS WITH CONTRAST TECHNIQUE: Multidetector CT imaging of the chest was performed using the standard protocol during bolus administration of intravenous contrast. Multiplanar CT image reconstructions and MIPs were obtained to evaluate the vascular anatomy.  Multidetector CT imaging of the abdomen and pelvis was performed using the standard protocol during bolus administration of intravenous contrast. CONTRAST:  75mL OMNIPAQUE IOHEXOL 350 MG/ML SOLN COMPARISON:  Chest radiograph performed earlier today at 3:36 p.m., and pelvic radiograph performed 11/20/2013; CTA of the chest performed 05/11/2011 FINDINGS: CTA CHEST FINDINGS There is no evidence of pulmonary embolus. The previously noted chronic web within the pulmonary artery to the right lower lobe has largely resolved. Mild peripheral opacities at the lung bases may reflect atelectasis or scarring. Nodular densities along the right major fissure likely reflect lymph nodes. There is minimal nodularity within the posterior aspects of the upper lobes bilaterally, which may reflect a mild atypical infectious process. There is no evidence of significant focal consolidation, pleural effusion or pneumothorax. Visualized mediastinal nodes remain normal in size. Scattered coronary artery calcifications are seen. No pericardial effusion is identified. Scattered calcification is noted along the aortic arch. The great vessels are grossly unremarkable. No axillary lymphadenopathy is seen. The visualized portions of the thyroid gland are unremarkable in appearance. No acute osseous abnormalities  are seen. CT ABDOMEN and PELVIS FINDINGS Scattered hypodensities within the liver, measuring up to 2.4 cm, are similar in appearance to 2013 and likely reflect cysts. The spleen is unremarkable in appearance. The gallbladder is within normal limits. The pancreas and adrenal glands are unremarkable. Large left renal cysts are noted, measuring up to 6.0 cm in size. A tiny right renal cyst is seen. There is no evidence of hydronephrosis. No renal or ureteral stones are seen. No perinephric stranding is appreciated. No free fluid is identified. The small bowel is unremarkable in appearance. The stomach is within normal limits. No acute  vascular abnormalities are seen. There is mild ectasia of the infrarenal abdominal aorta, without evidence of aneurysmal dilatation. Scattered calcification is noted along the abdominal aorta and its branches. The appendix is not definitely characterized; there is no evidence of appendicitis. Minimal diverticulosis is noted along the proximal sigmoid colon. The colon is otherwise unremarkable. The bladder is mildly distended and grossly unremarkable. The uterus is unremarkable in appearance. The ovaries are grossly symmetric. No suspicious adnexal masses are seen. No inguinal lymphadenopathy is seen. No acute osseous abnormalities are identified. Review of the MIP images confirms the above findings. IMPRESSION: 1. No evidence of pulmonary embolus. 2. Minimal nodularity within the posterior aspects of the upper lung lobes bilaterally, which may reflect a mild atypical infectious process, depending on the patient's symptoms. 3. Mild peripheral opacities in the lung bases may reflect atelectasis or scarring. 4. Scattered coronary artery calcification noted. 5. Scattered hypodensities within the liver appear relatively stable and likely reflect cysts, though not fully imaged on the prior study. 6. Large left renal cysts noted.  Tiny right renal cyst seen. 7. Scattered calcification along the abdominal aorta and its branches. 8. Minimal diverticulosis along the proximal sigmoid colon, without evidence of diverticulitis. Electronically Signed   By: Roanna Raider M.D.   On: 01/25/2015 18:19   Dg Chest Port 1 View  01/25/2015  CLINICAL DATA:  AMS, hypotension. Hx htn. nonsmoker EXAM: PORTABLE CHEST 1 VIEW COMPARISON:  06/10/2014 FINDINGS: Heart size is normal. No focal consolidations or pleural effusions. No pulmonary edema. Patient is rotated towards the right. Degenerative changes in both shoulders. IMPRESSION: No evidence for acute  abnormality. Electronically Signed   By: Norva Pavlov M.D.   On: 01/25/2015  16:05    EKG:  Independently reviewed. No new ischemic changes.   Assessment/Plan  AMS: resolved, likely due to hypotension. No fall per family. No head laceration. Will defer head CT for now unless mental status worsened ( as she is unlikely to stay still in CT too). Pupils are equal and reactive to light.   Hypotension:  Unknown etiology, possible due to sepsis Bcx sent Given vanc/zosyn , will continue levoquin for CAP awaiting Bcx result. UA was neg. CT chest/pelvis/abdomen : unremarkable.  Responded to IVF.  DDx: due to medications. Will hold BP meds, continue IVF.  Keep on tele, check trops#3 to rule out ACS.  HTN: hold BP meds.   H/o PE: off AC due to recurrent falls.    DVT prophylaxis: [place] enoxaparin Code Status: Discussed with family at bedside, agreed to make her DNR/DNI.  Family Communication: daughter and granddaughter at bedside.  Disposition Plan: Obs on tele.     Eston Esters M.D [facility]

## 2015-01-25 NOTE — ED Notes (Signed)
Unable to obtain repeat rectal temp due to pt's combative behavior.

## 2015-01-25 NOTE — ED Notes (Signed)
[facility] Per [facility] start antibiotics NOW and call phlebotomy for other culture set. Prior to [facility] attempt 3 attempts to draw cultures unsuccessful.

## 2015-01-25 NOTE — ED Provider Notes (Signed)
CSN: [id number]     Arrival date & time 01/25/15  1410 History   First MD Initiated Contact with Patient 01/25/15 1458     Chief Complaint  Patient presents with  . Hypotension     (Consider location/radiation/quality/duration/timing/severity/associated sxs/prior Treatment) Patient is a 81 y.o. female presenting with general illness. The history is provided by the patient.  Illness Severity:  Severe Onset quality:  Sudden Duration:  2 days Timing:  Constant Progression:  Unchanged Chronicity:  New Associated symptoms: fatigue   Associated symptoms: no chest pain, no congestion, no fever, no headaches, no myalgias, no nausea, no rhinorrhea, no shortness of breath, no vomiting and no wheezing     81 yo F with a chief complaint of altered mental status. Was found to have hypertension when EMS arrived. 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 Significant history of dementia. Limited history.  Past Medical History  Diagnosis Date  . Hypertension   . Hypercholesterolemia   . Osteoporosis   . Tendonitis   . Vitamin D deficiency   . Alzheimer disease    History reviewed. No pertinent past surgical history. No family history on file. Social History  Substance Use Topics  . Smoking status: Never Smoker   . Smokeless tobacco: None  . Alcohol Use: No   OB History    No data available     Review of Systems  Unable to perform ROS: Dementia  Constitutional: Positive for fatigue. Negative for fever and chills.  HENT: Negative for congestion and rhinorrhea.   Eyes: Negative for redness and visual disturbance.  Respiratory: Negative for shortness of breath and wheezing.   Cardiovascular: Negative for chest pain and palpitations.  Gastrointestinal: Negative for nausea and vomiting.  Genitourinary: Negative for dysuria and urgency.  Musculoskeletal: Negative for myalgias and arthralgias.  Skin: Negative for pallor and wound.  Neurological: Negative for dizziness and headaches.       Allergies  Review of patient's allergies indicates no known allergies.  Home Medications   Prior to Admission medications   Medication Sig Start Date End Date Taking? Authorizing Provider  acetaminophen (TYLENOL) 500 MG tablet Take 500 mg by mouth every 4 (four) hours as needed for mild pain, moderate pain, fever or headache.   Yes Historical Provider, MD  acetaminophen (TYLENOL) 650 MG CR tablet Take 650 mg by mouth 3 (three) times daily.   Yes Historical Provider, MD  ALPRAZolam (XANAX) 0.25 MG tablet Take 0.25 mg by mouth 2 (two) times daily.   Yes Historical Provider, MD  alum & mag hydroxide-simeth (MAALOX/MYLANTA) 200-200-20 MG/5ML suspension Take 30 mLs by mouth every 6 (six) hours as needed for indigestion or heartburn.   Yes Historical Provider, MD  aspirin 81 MG chewable tablet Chew 81 mg by mouth daily.   Yes Historical Provider, MD  atorvastatin (LIPITOR) 20 MG tablet Take 20 mg by mouth at bedtime.    Yes Historical Provider, MD  divalproex (DEPAKOTE SPRINKLE) 125 MG capsule Take 250 mg by mouth 3 (three) times daily.   Yes Historical Provider, MD  guaifenesin (ROBITUSSIN) 100 MG/5ML syrup Take 200 mg by mouth every 6 (six) hours as needed for cough.   Yes Historical Provider, MD  haloperidol (HALDOL) 0.5 MG tablet Take 0.5 mg by mouth at bedtime.    Yes Historical Provider, MD  lisinopril (PRINIVIL,ZESTRIL) 10 MG tablet Take 10 mg by mouth daily.   Yes Historical Provider, MD  loperamide (IMODIUM) 2 MG capsule Take 2 mg by mouth as needed  for diarrhea or loose stools (take 1 capsule as needed with each loose stool for diarrhea (not to exceed 8 doses in 24 hours)).    Yes Historical Provider, MD  magnesium hydroxide (MILK OF MAGNESIA) 400 MG/5ML suspension Take 30 mLs by mouth at bedtime as needed for mild constipation or moderate constipation.   Yes Historical Provider, MD  Multiple Vitamins-Minerals (MULTIVITAMINS THER. W/MINERALS) TABS Take 1 tablet by mouth daily.   Yes  Historical Provider, MD  neomycin-bacitracin-polymyxin (NEOSPORIN) [id number] ointment Apply 1 application topically as needed (apply to minor skin tears or abrasions, cover with band aid/gauze and tape. change as needed until healed).   Yes Historical Provider, MD  Nutritional Supplements (NUTRITIONAL DRINK) LIQD Take 1 Bottle by mouth 3 (three) times daily.    Yes Historical Provider, MD  rivastigmine (EXELON) 1.5 MG capsule Take 3 mg by mouth 2 (two) times daily.    Yes Historical Provider, MD  Vitamins-Lipotropics (LIPO-FLAVONOID PLUS PO) Take 1 tablet by mouth daily.   Yes Historical Provider, MD  cephALEXin (KEFLEX) 500 MG capsule Take 1 capsule (500 mg total) by mouth 3 (three) times daily. Patient not taking: Reported on 01/25/2015 11/26/14   Danelle Berry, PA-C   BP 112/61 mmHg  Pulse 89  Temp(Src) 95.8 F (35.4 C) (Rectal)  Resp 14  SpO2 92% Physical Exam  Constitutional: She is oriented to person, place, and time. She appears well-developed and well-nourished. She appears cachectic. She is uncooperative. She appears ill. No distress.  HENT:  Head: Normocephalic and atraumatic.  Eyes: EOM are normal. Pupils are equal, round, and reactive to light.  Neck: Normal range of motion. Neck supple.  Cardiovascular: Normal rate and regular rhythm.  Exam reveals no gallop and no friction rub.   No murmur heard. Pulmonary/Chest: Effort normal. She has no wheezes. She has no rales.  Abdominal: Soft. She exhibits no distension. There is no tenderness. There is no rebound and no guarding.  Musculoskeletal: She exhibits no edema or tenderness.  Neurological: She is alert and oriented to person, place, and time.  Skin: Skin is warm and dry. She is not diaphoretic.  Psychiatric: She has a normal mood and affect. Her behavior is normal.  Nursing note and vitals reviewed.   ED Course  Procedures (including critical care time) Labs Review Labs Reviewed  CBC WITH DIFFERENTIAL/PLATELET -  Abnormal; Notable for the following:    RBC 3.52 (*)    Hemoglobin 11.2 (*)    HCT 34.1 (*)    All other components within normal limits  COMPREHENSIVE METABOLIC PANEL - Abnormal; Notable for the following:    BUN 30 (*)    Creatinine, Ser 1.18 (*)    Total Protein 6.4 (*)    GFR calc non Af Amer 39 (*)    GFR calc Af Amer 45 (*)    All other components within normal limits  URINE CULTURE  CULTURE, BLOOD (ROUTINE X 2)  CULTURE, BLOOD (ROUTINE X 2)  TROPONIN I  URINALYSIS, ROUTINE W REFLEX MICROSCOPIC (NOT AT [facility])  TSH  BASIC METABOLIC PANEL  TROPONIN I  TROPONIN I  I-STAT CG4 LACTIC ACID, ED    Imaging Review Ct Angio Chest Pe W/cm &/or Wo Cm  01/25/2015  CLINICAL DATA:  Acute onset of altered mental status and hypotension. Personal history of pulmonary embolus, not currently on anticoagulation. Initial encounter. EXAM: CT ANGIOGRAPHY CHEST CT ABDOMEN AND PELVIS WITH CONTRAST TECHNIQUE: Multidetector CT imaging of the chest was performed using the standard protocol  during bolus administration of intravenous contrast. Multiplanar CT image reconstructions and MIPs were obtained to evaluate the vascular anatomy. Multidetector CT imaging of the abdomen and pelvis was performed using the standard protocol during bolus administration of intravenous contrast. CONTRAST:  75mL OMNIPAQUE IOHEXOL 350 MG/ML SOLN COMPARISON:  Chest radiograph performed earlier today at 3:36 p.m., and pelvic radiograph performed 11/20/2013; CTA of the chest performed 05/11/2011 FINDINGS: CTA CHEST FINDINGS There is no evidence of pulmonary embolus. The previously noted chronic web within the pulmonary artery to the right lower lobe has largely resolved. Mild peripheral opacities at the lung bases may reflect atelectasis or scarring. Nodular densities along the right major fissure likely reflect lymph nodes. There is minimal nodularity within the posterior aspects of the upper lobes bilaterally, which may reflect a mild  atypical infectious process. There is no evidence of significant focal consolidation, pleural effusion or pneumothorax. Visualized mediastinal nodes remain normal in size. Scattered coronary artery calcifications are seen. No pericardial effusion is identified. Scattered calcification is noted along the aortic arch. The great vessels are grossly unremarkable. No axillary lymphadenopathy is seen. The visualized portions of the thyroid gland are unremarkable in appearance. No acute osseous abnormalities are seen. CT ABDOMEN and PELVIS FINDINGS Scattered hypodensities within the liver, measuring up to 2.4 cm, are similar in appearance to 2013 and likely reflect cysts. The spleen is unremarkable in appearance. The gallbladder is within normal limits. The pancreas and adrenal glands are unremarkable. Large left renal cysts are noted, measuring up to 6.0 cm in size. A tiny right renal cyst is seen. There is no evidence of hydronephrosis. No renal or ureteral stones are seen. No perinephric stranding is appreciated. No free fluid is identified. The small bowel is unremarkable in appearance. The stomach is within normal limits. No acute vascular abnormalities are seen. There is mild ectasia of the infrarenal abdominal aorta, without evidence of aneurysmal dilatation. Scattered calcification is noted along the abdominal aorta and its branches. The appendix is not definitely characterized; there is no evidence of appendicitis. Minimal diverticulosis is noted along the proximal sigmoid colon. The colon is otherwise unremarkable. The bladder is mildly distended and grossly unremarkable. The uterus is unremarkable in appearance. The ovaries are grossly symmetric. No suspicious adnexal masses are seen. No inguinal lymphadenopathy is seen. No acute osseous abnormalities are identified. Review of the MIP images confirms the above findings. IMPRESSION: 1. No evidence of pulmonary embolus. 2. Minimal nodularity within the posterior  aspects of the upper lung lobes bilaterally, which may reflect a mild atypical infectious process, depending on the patient's symptoms. 3. Mild peripheral opacities in the lung bases may reflect atelectasis or scarring. 4. Scattered coronary artery calcification noted. 5. Scattered hypodensities within the liver appear relatively stable and likely reflect cysts, though not fully imaged on the prior study. 6. Large left renal cysts noted.  Tiny right renal cyst seen. 7. Scattered calcification along the abdominal aorta and its branches. 8. Minimal diverticulosis along the proximal sigmoid colon, without evidence of diverticulitis. Electronically Signed   By: Roanna Raider M.D.   On: 01/25/2015 18:19   Ct Abdomen Pelvis W Contrast  01/25/2015  CLINICAL DATA:  Acute onset of altered mental status and hypotension. Personal history of pulmonary embolus, not currently on anticoagulation. Initial encounter. EXAM: CT ANGIOGRAPHY CHEST CT ABDOMEN AND PELVIS WITH CONTRAST TECHNIQUE: Multidetector CT imaging of the chest was performed using the standard protocol during bolus administration of intravenous contrast. Multiplanar CT image reconstructions and MIPs were obtained  to evaluate the vascular anatomy. Multidetector CT imaging of the abdomen and pelvis was performed using the standard protocol during bolus administration of intravenous contrast. CONTRAST:  75mL OMNIPAQUE IOHEXOL 350 MG/ML SOLN COMPARISON:  Chest radiograph performed earlier today at 3:36 p.m., and pelvic radiograph performed 11/20/2013; CTA of the chest performed 05/11/2011 FINDINGS: CTA CHEST FINDINGS There is no evidence of pulmonary embolus. The previously noted chronic web within the pulmonary artery to the right lower lobe has largely resolved. Mild peripheral opacities at the lung bases may reflect atelectasis or scarring. Nodular densities along the right major fissure likely reflect lymph nodes. There is minimal nodularity within the posterior  aspects of the upper lobes bilaterally, which may reflect a mild atypical infectious process. There is no evidence of significant focal consolidation, pleural effusion or pneumothorax. Visualized mediastinal nodes remain normal in size. Scattered coronary artery calcifications are seen. No pericardial effusion is identified. Scattered calcification is noted along the aortic arch. The great vessels are grossly unremarkable. No axillary lymphadenopathy is seen. The visualized portions of the thyroid gland are unremarkable in appearance. No acute osseous abnormalities are seen. CT ABDOMEN and PELVIS FINDINGS Scattered hypodensities within the liver, measuring up to 2.4 cm, are similar in appearance to 2013 and likely reflect cysts. The spleen is unremarkable in appearance. The gallbladder is within normal limits. The pancreas and adrenal glands are unremarkable. Large left renal cysts are noted, measuring up to 6.0 cm in size. A tiny right renal cyst is seen. There is no evidence of hydronephrosis. No renal or ureteral stones are seen. No perinephric stranding is appreciated. No free fluid is identified. The small bowel is unremarkable in appearance. The stomach is within normal limits. No acute vascular abnormalities are seen. There is mild ectasia of the infrarenal abdominal aorta, without evidence of aneurysmal dilatation. Scattered calcification is noted along the abdominal aorta and its branches. The appendix is not definitely characterized; there is no evidence of appendicitis. Minimal diverticulosis is noted along the proximal sigmoid colon. The colon is otherwise unremarkable. The bladder is mildly distended and grossly unremarkable. The uterus is unremarkable in appearance. The ovaries are grossly symmetric. No suspicious adnexal masses are seen. No inguinal lymphadenopathy is seen. No acute osseous abnormalities are identified. Review of the MIP images confirms the above findings. IMPRESSION: 1. No evidence  of pulmonary embolus. 2. Minimal nodularity within the posterior aspects of the upper lung lobes bilaterally, which may reflect a mild atypical infectious process, depending on the patient's symptoms. 3. Mild peripheral opacities in the lung bases may reflect atelectasis or scarring. 4. Scattered coronary artery calcification noted. 5. Scattered hypodensities within the liver appear relatively stable and likely reflect cysts, though not fully imaged on the prior study. 6. Large left renal cysts noted.  Tiny right renal cyst seen. 7. Scattered calcification along the abdominal aorta and its branches. 8. Minimal diverticulosis along the proximal sigmoid colon, without evidence of diverticulitis. Electronically Signed   By: Roanna Raider M.D.   On: 01/25/2015 18:19   Dg Chest Port 1 View  01/25/2015  CLINICAL DATA:  AMS, hypotension. Hx htn. nonsmoker EXAM: PORTABLE CHEST 1 VIEW COMPARISON:  06/10/2014 FINDINGS: Heart size is normal. No focal consolidations or pleural effusions. No pulmonary edema. Patient is rotated towards the right. Degenerative changes in both shoulders. IMPRESSION: No evidence for acute  abnormality. Electronically Signed   By: Norva Pavlov M.D.   On: 01/25/2015 16:05   I have personally reviewed and evaluated these images and  lab results as part of my medical decision-making.   EKG Interpretation   Date/Time:  Saturday January 25 2015 14:24:26 EST Ventricular Rate:  68 PR Interval:  63 QRS Duration: 124 QT Interval:  437 QTC Calculation: 465 R Axis:   -42 Text Interpretation:  Sinus rhythm RBBB and LAFB No significant change  since last tracing Confirmed by Cayleb Jarnigan MD, Reuel BoomANIEL [id number]) on 01/25/2015  3:20:16 PM      MDM   Final diagnoses:  Sepsis, due to unspecified organism (HCC)  Hypotension, unspecified hypotension type   EJ placement: left 18 gauge IV placed in L EJ. Skin prepped with alcohol pads, left EJ identified with Valsalva. Cannulated with good return  of dark, non-pulsatile blood. Tachyderm placed after easily flushed with NS.   81 yo F with a chief complaint of altered mental status. Hypothermic on arrival with low blood pressures likely sepsis. SBP in the 70's.  Patient also had a recent hospitalization where she was diagnosed with a PE started on Xarelto. Since then she had multiple falls and is no longer have that listed as a medication. Code sepsis  We'll obtain a CBC CMP troponin EKG chest x-ray urine lactic acid. Vanc and Zosyn  Patient evaluation unremarkable. We'll obtain a CT angiogram of the chest to rule out PE. CT scan of abdomen and pelvis rule out intra-abdominal process. Patient was hypotensive after 30 mL/kg fluid. Patient was given hydrocortisone with improvement of blood pressure. CT chest abdomen and  pelvis negative. UA and chest x-ray negative. Discussed the case with hospitalist will admit.   CRITICAL CARE Performed by: Rae Roamaniel Patrick Emoni Yang   Total critical care time: 35 minutes  Critical care time was exclusive of separately billable procedures and treating other patients.  Critical care was necessary to treat or prevent imminent or life-threatening deterioration.  Critical care was time spent personally by me on the following activities: development of treatment plan with patient and/or surrogate as well as nursing, discussions with consultants, evaluation of patient's response to treatment, examination of patient, obtaining history from patient or surrogate, ordering and performing treatments and interventions, ordering and review of laboratory studies, ordering and review of radiographic studies, pulse oximetry and re-evaluation of patient's condition.  The patients results and plan were reviewed and discussed.   Any x-rays performed were independently reviewed by myself.   Differential diagnosis were considered with the presenting HPI.  Medications  0.9 %  sodium chloride infusion ( Intravenous Stopped  01/25/15 1937)  enoxaparin (LOVENOX) injection 40 mg (not administered)  0.9 %  sodium chloride infusion ( Intravenous New Bag/Given 01/25/15 1940)  aspirin chewable tablet 81 mg (81 mg Oral Given 01/25/15 1940)  atorvastatin (LIPITOR) tablet 20 mg (not administered)  levofloxacin (LEVAQUIN) IVPB 750 mg (not administered)  sodium chloride 0.9 % bolus 1,000 mL (0 mLs Intravenous Stopped 01/25/15 1607)  vancomycin (VANCOCIN) 1,500 mg in sodium chloride 0.9 % 500 mL IVPB (0 mg Intravenous Stopped 01/25/15 1828)  piperacillin-tazobactam (ZOSYN) IVPB 3.375 g (0 g Intravenous Stopped 01/25/15 1544)  sodium chloride 0.9 % bolus 500 mL (0 mLs Intravenous Stopped 01/25/15 1637)  hydrocortisone sodium succinate (SOLU-CORTEF) 100 MG injection 100 mg (100 mg Intravenous Given 01/25/15 1720)  iohexol (OMNIPAQUE) 350 MG/ML injection 75 mL (75 mLs Intravenous Contrast Given 01/25/15 1736)    Filed Vitals:   01/25/15 1900 01/25/15 1907 01/25/15 1908 01/25/15 1915  BP: 90/73 112/61    Pulse:   89 89  Temp:      TempSrc:  Resp:   11 14  SpO2:   93% 92%    Final diagnoses:  Sepsis, due to unspecified organism (HCC)  Hypotension, unspecified hypotension type    Admission/ observation were discussed with the admitting physician, patient and/or family and they are comfortable with the plan.     Melene Plan, DO 01/25/15 1942

## 2015-01-25 NOTE — ED Notes (Signed)
Bear hugger applied 

## 2015-01-26 ENCOUNTER — Encounter (HOSPITAL_COMMUNITY): Payer: Self-pay | Admitting: Internal Medicine

## 2015-01-26 DIAGNOSIS — A419 Sepsis, unspecified organism: Secondary | ICD-10-CM

## 2015-01-26 DIAGNOSIS — G9340 Encephalopathy, unspecified: Secondary | ICD-10-CM | POA: Diagnosis present

## 2015-01-26 DIAGNOSIS — N189 Chronic kidney disease, unspecified: Secondary | ICD-10-CM

## 2015-01-26 DIAGNOSIS — N183 Chronic kidney disease, stage 3 unspecified: Secondary | ICD-10-CM | POA: Diagnosis present

## 2015-01-26 DIAGNOSIS — F0390 Unspecified dementia without behavioral disturbance: Secondary | ICD-10-CM | POA: Diagnosis not present

## 2015-01-26 DIAGNOSIS — I2699 Other pulmonary embolism without acute cor pulmonale: Secondary | ICD-10-CM

## 2015-01-26 DIAGNOSIS — E785 Hyperlipidemia, unspecified: Secondary | ICD-10-CM | POA: Diagnosis present

## 2015-01-26 DIAGNOSIS — D631 Anemia in chronic kidney disease: Secondary | ICD-10-CM | POA: Diagnosis present

## 2015-01-26 LAB — MRSA PCR SCREENING: MRSA by PCR: NEGATIVE

## 2015-01-26 LAB — BASIC METABOLIC PANEL
ANION GAP: 6 (ref 5–15)
BUN: 25 mg/dL — ABNORMAL HIGH (ref 6–20)
CALCIUM: 8.5 mg/dL — AB (ref 8.9–10.3)
CO2: 23 mmol/L (ref 22–32)
CREATININE: 1.06 mg/dL — AB (ref 0.44–1.00)
Chloride: 112 mmol/L — ABNORMAL HIGH (ref 101–111)
GFR, EST AFRICAN AMERICAN: 52 mL/min — AB (ref >60)
GFR, EST NON AFRICAN AMERICAN: 44 mL/min — AB (ref >60)
Glucose, Bld: 98 mg/dL (ref 65–99)
Potassium: 4.3 mmol/L (ref 3.5–5.1)
SODIUM: 141 mmol/L (ref 135–145)

## 2015-01-26 LAB — TROPONIN I: TROPONIN I: 0.07 ng/mL — AB (ref <0.031)

## 2015-01-26 MED ORDER — HALOPERIDOL 0.5 MG PO TABS
0.5000 mg | ORAL_TABLET | Freq: Every day | ORAL | Status: DC
  Administered 2015-01-26: 0.5 mg via ORAL
  Filled 2015-01-26: qty 1

## 2015-01-26 MED ORDER — AMLODIPINE BESYLATE 5 MG PO TABS
5.0000 mg | ORAL_TABLET | Freq: Every day | ORAL | Status: DC
  Administered 2015-01-26 – 2015-01-27 (×2): 5 mg via ORAL
  Filled 2015-01-26 (×2): qty 1

## 2015-01-26 MED ORDER — LISINOPRIL 10 MG PO TABS
10.0000 mg | ORAL_TABLET | Freq: Every day | ORAL | Status: DC
  Filled 2015-01-26: qty 1

## 2015-01-26 MED ORDER — DIVALPROEX SODIUM 125 MG PO CSDR
250.0000 mg | DELAYED_RELEASE_CAPSULE | Freq: Three times a day (TID) | ORAL | Status: DC
  Administered 2015-01-26 – 2015-01-27 (×4): 250 mg via ORAL
  Filled 2015-01-26 (×5): qty 2

## 2015-01-26 NOTE — Progress Notes (Addendum)
Patient ID: Pamela Ramirez, female   DOB: 08-31-1921, 81 y.o.   MRN: [id number] [facility] PROGRESS NOTE  Pamela Ramirez [id number] DOB: 1921/06/19 DOA: 01/25/2015 PCP: Pcp Not In System - from nursing home   Brief narrative:    81 year old female with past medical history of dementia (non verbal at baseline), PE, HTN who presented to [facility] ED from SNF because of altered mental status and less responsive in past day prior to this admission. She has history of falls but there was no report of fall in SNF. In ED, BP was 75/37 and has improved to 153/60 with IV fluids. T was 95.8 F, RR 20. Blood work showed Cr of 1.18 (baseline 6 months ago 1.24). CXR and UA were unremarkable. She was started on empiric Levaquin and admitted for further evaluation of altered mental status.   Anticipated discharge: needs PT eval, likely to SNF by 11/15.  Assessment/Plan:    Principal Problem:   Acute encephalopathy / Dementia without the behavioral disturbance - Stable - Non verbal - Needs PT evaluation - May continue haldol at bedtime   Active Problems:   Sepsis ([place]) - Sepsis criteria met on admission with hypothermia (T 95.8 F), tachypnea, hypotension, however no clear source of infection - CXR WNL - UA unremarkable - Blood and urine culture results pending  - Pt on empiric Levaquin    Dyslipidemia - Continue Lipitor    Essential hypertension - Hold lisinopril due to renal insufficiency - Start Norvasc 5 mg daily     CKD (chronic kidney disease) stage 3, GFR 30-59 ml/min - About 6 months ago Cr was 1.24 - Cr on this admission 1.18    Anemia of chronic kidney failure - Hemoglobin 11.2, stable     Pulmonary embolism ([place]) - Has had PE in 2013 but on this admission, CT angio chest showed no PE. - She is on aspirin daily    DVT Prophylaxis  - Lovenox subQ  Code Status: DNR/DNI Family Communication:  plan of care discussed with the patient's granddaughter at the bedside   Disposition Plan: needs PT eval, anticipate D/C by 01/28/2015.   IV access:  Peripheral IV  Procedures and diagnostic studies:    Ct Angio Chest Pe W/cm &/or Wo Cm 01/25/2015  1. No evidence of pulmonary embolus. 2. Minimal nodularity within the posterior aspects of the upper lung lobes bilaterally, which may reflect a mild atypical infectious process, depending on the patient's symptoms. 3. Mild peripheral opacities in the lung bases may reflect atelectasis or scarring. 4. Scattered coronary artery calcification noted. 5. Scattered hypodensities within the liver appear relatively stable and likely reflect cysts, though not fully imaged on the prior study. 6. Large left renal cysts noted.  Tiny right renal cyst seen. 7. Scattered calcification along the abdominal aorta and its branches. 8. Minimal diverticulosis along the proximal sigmoid colon, without evidence of diverticulitis. Electronically Signed   By: Garald Balding M.D.   On: 01/25/2015 18:19   Ct Abdomen Pelvis W Contrast 01/25/2015  1. No evidence of pulmonary embolus. 2. Minimal nodularity within the posterior aspects of the upper lung lobes bilaterally, which may reflect a mild atypical infectious process, depending on the patient's symptoms. 3. Mild peripheral opacities in the lung bases may reflect atelectasis or scarring. 4. Scattered coronary artery calcification noted. 5. Scattered hypodensities within the liver appear relatively stable and likely reflect cysts, though not fully imaged on the prior study. 6. Large left renal cysts noted.  Tiny right renal cyst seen. 7. Scattered calcification along the abdominal aorta and its branches. 8. Minimal diverticulosis along the proximal sigmoid colon, without evidence of diverticulitis. Electronically Signed   By: Garald Balding M.D.   On: 01/25/2015 18:19   Dg Chest Port 1 View 01/25/2015   No evidence for acute  abnormality. Electronically Signed   By: Nolon Nations M.D.   On: 01/25/2015  16:05    Medical Consultants:  None   Other Consultants:  Nutrition PT  IAnti-Infectives:   Levaquin 01/25/2015 -->   Leisa Lenz, MD  [facility] Pager [phone]  Time spent in minutes: 25 minutes  If 7PM-7AM, please contact night-coverage [email or web address] Password [facility] 01/26/2015, 8:47 AM   LOS: 1 day    HPI/Subjective: No acute overnight events. Pt non verbal. No respiratory distress.   Objective: Filed Vitals:   01/25/15 2130 01/25/15 2145 01/25/15 2206 01/26/15 0553  BP: 131/71  113/49 153/60  Pulse:   91 95  Temp:  97.5 F (36.4 C) 98.3 F (36.8 C) 98.5 F (36.9 C)  TempSrc:  Rectal Axillary Oral  Resp: _0 Height:   _1  (1.651 m)   Weight:   61 kg (134 lb 7.7 oz)   SpO2:   93% 97%    Intake/Output Summary (Last 24 hours) at 01/26/15 0847 Last data filed at 01/26/15 0700  Gross per 24 hour  Intake 1403.33 ml  Output    400 ml  Net 1003.33 ml    Exam:   General:  Pt is alert, non verbal, not in acute distress  Cardiovascular: Regular rate and rhythm, S1/S2 appreciated   Respiratory: Clear to auscultation bilaterally, no wheezing, no crackles, no rhonchi  Abdomen: Soft, non tender, non distended, bowel sounds present  Extremities: No edema, pulses DP and PT palpable bilaterally  Neuro: Grossly nonfocal  Data Reviewed: Basic Metabolic Panel:  Recent Labs Lab 01/25/15 1450  NA 142  K 4.8  CL 107  CO2 28  GLUCOSE 93  BUN 30*  CREATININE 1.18*  CALCIUM 9.4   Liver Function Tests:  Recent Labs Lab 01/25/15 1450  AST 29  ALT 41  ALKPHOS 59  BILITOT 0.5  PROT 6.4*  ALBUMIN 3.5   No results for input(s): LIPASE, AMYLASE in the last 168 hours. No results for input(s): AMMONIA in the last 168 hours. CBC:  Recent Labs Lab 01/25/15 1450  WBC 5.0  NEUTROABS 2.5  HGB 11.2*  HCT 34.1*  MCV 96.9  PLT 179   Cardiac Enzymes:  Recent Labs Lab 01/25/15 1450 01/25/15 2059  TROPONINI <0.03 0.03    BNP: Invalid input(s): POCBNP CBG: No results for input(s): GLUCAP in the last 168 hours.  Recent Results (from the past 240 hour(s))  MRSA PCR Screening     Status: None   Collection Time: 01/25/15 10:56 PM  Result Value Ref Range Status   MRSA by PCR NEGATIVE NEGATIVE Final     Scheduled Meds: . aspirin  81 mg Oral Daily  . atorvastatin  20 mg Oral QHS  . divalproex  250 mg Oral TID  . enoxaparin (LOVENOX) injection  40 mg Subcutaneous Q24H  . haloperidol  0.5 mg Oral QHS  . levofloxacin (LEVAQUIN) IV  750 mg Intravenous Q48H  . lisinopril  10 mg Oral Daily   Continuous Infusions: . sodium chloride Stopped (01/25/15 1937)

## 2015-01-26 NOTE — Progress Notes (Signed)
PT Cancellation Note  Patient Details Name: Owens SharkBeatrice D Dettore MRN: [id number] DOB: 06/11/1921   Cancelled Treatment:     PT order received and eval attempted.  Pt unwilling to participate with PT,  curling legs and arms up and then pulling blankets over head in response to repeated encouragement to participate.  Will follow in am.   Diedre Maclellan 01/26/2015, 12:31 PM

## 2015-01-27 DIAGNOSIS — D631 Anemia in chronic kidney disease: Secondary | ICD-10-CM

## 2015-01-27 DIAGNOSIS — A419 Sepsis, unspecified organism: Secondary | ICD-10-CM | POA: Diagnosis not present

## 2015-01-27 DIAGNOSIS — F0390 Unspecified dementia without behavioral disturbance: Secondary | ICD-10-CM | POA: Diagnosis not present

## 2015-01-27 DIAGNOSIS — N183 Chronic kidney disease, stage 3 (moderate): Secondary | ICD-10-CM | POA: Diagnosis not present

## 2015-01-27 DIAGNOSIS — G9340 Encephalopathy, unspecified: Secondary | ICD-10-CM | POA: Diagnosis not present

## 2015-01-27 DIAGNOSIS — R7989 Other specified abnormal findings of blood chemistry: Secondary | ICD-10-CM

## 2015-01-27 LAB — URINE CULTURE: Culture: NO GROWTH

## 2015-01-27 MED ORDER — LEVOFLOXACIN 500 MG PO TABS: 500.0000 mg | ORAL_TABLET | Freq: Every day | ORAL | Status: DC

## 2015-01-27 MED ORDER — ALPRAZOLAM 0.25 MG PO TABS: 0.2500 mg | ORAL_TABLET | Freq: Two times a day (BID) | ORAL | Status: DC

## 2015-01-27 MED ORDER — AMLODIPINE BESYLATE 5 MG PO TABS: 5.0000 mg | ORAL_TABLET | Freq: Every day | ORAL | Status: DC

## 2015-01-27 MED ORDER — HALOPERIDOL 0.5 MG PO TABS: 0.5000 mg | ORAL_TABLET | Freq: Every day | ORAL | Status: DC

## 2015-01-27 NOTE — Care Management Note (Signed)
Case Management Note  Patient Details  Name: Pamela Ramirez MRN: [id number] Date of Birth: [birth year]  Subjective/Objective:    81 y/o f admitted w/Acute encephalopathy. From ALF                Action/Plan:d/c back to ALF   Expected Discharge Date:                  Expected Discharge Plan:  Assisted Living / Rest Home  In-House Referral:  Clinical Social Work  Discharge planning Services  CM Consult  Post Acute Care Choice:    Choice offered to:     DME Arranged:    DME Agency:     HH Arranged:    HH Agency:     Status of Service:  Completed, signed off  Medicare Important Message Given:    Date Medicare IM Given:    Medicare IM give by:    Date Additional Medicare IM Given:    Additional Medicare Important Message give by:     If discussed at Long Length of Stay Meetings, dates discussed:    Additional Comments:  Lanier ClamMahabir, Koven Belinsky, RN 01/27/2015, 3:58 PM

## 2015-01-27 NOTE — Progress Notes (Signed)
Pt's daughter / granddaughter are aware pt is ready to [facility]. [facility] transport is required. Family is aware out of pocket costs may be associated with [facility] transport. D/C summary sent to facility for review prior to d/c. Scripts included in d/c packet.  Cori RazorJamie Grae Cannata LCSW [phone]

## 2015-01-27 NOTE — NC FL2 (Signed)
[place] MEDICAID FL2 LEVEL OF CARE SCREENING TOOL     IDENTIFICATION  Patient Name: Pamela Ramirez Birthdate: 1921/05/30 Sex: female Admission Date (Current Location): 01/25/2015  [facility] [place]     Facility and Address:  [facility],  [address], [place] [id number]      Provider Number: [id number]  Attending Physician Name and Address:  Alison MurrayAlma M Devine, MD  Relative Name and Phone Number:       Current Level of Care: Hospital Recommended Level of Care: Memory Care Prior Approval Number:    Date Approved/Denied:   PASRR Number:    Discharge Plan: Other (Comment) (Memory Care Unit)    Current Diagnoses: Patient Active Problem List   Diagnosis Date Noted  . Acute encephalopathy 01/26/2015  . Dyslipidemia 01/26/2015  . CKD (chronic kidney disease) stage 3, GFR 30-59 ml/min 01/26/2015  . Anemia of chronic kidney failure 01/26/2015  . Sepsis (HCC) 01/25/2015  . Pulmonary embolism (H[facility]) 05/12/2011  . Dementia 05/12/2011    Orientation ACTIVITIES/SOCIAL BLADDER RESPIRATION     (disoriented x4)  Active Incontinent Normal  BEHAVIORAL SYMPTOMS/MOOD NEUROLOGICAL BOWEL NUTRITION STATUS   (Pt can be uncooperative with care at times.)   Incontinent Diet  PHYSICIAN VISITS COMMUNICATION OF NEEDS Height & Weight Skin    Verbally 5\' 5"  (165.1 cm) 134 lbs. Normal          AMBULATORY STATUS RESPIRATION     (moderate assistance ) Normal      Personal Care Assistance Level of Assistance  Bathing, Feeding, Dressing Bathing Assistance: Maximum assistance Feeding assistance: Maximum assistance Dressing Assistance: Maximum assistance      Functional Limitations Info                SPECIAL CARE FACTORS FREQUENCY  PT (By licensed PT)     PT Frequency: 3 x wkly             Additional Factors Info    Code Status Info: DNR             Current Medications (01/27/2015): Current Facility-Administered Medications   Medication Dose Route Frequency Provider Last Rate Last Dose  . 0.9 %  sodium chloride infusion   Intravenous Continuous Alison MurrayAlma M Devine, MD 50 mL/hr at 01/26/15 2034    . amLODipine (NORVASC) tablet 5 mg  5 mg Oral Daily Alison MurrayAlma M Devine, MD   5 mg at 01/27/15 0953  . aspirin chewable tablet 81 mg  81 mg Oral Daily Eston EstersAhmad Hamad, MD   81 mg at 01/27/15 [id number]  . atorvastatin (LIPITOR) tablet 20 mg  20 mg Oral QHS Eston EstersAhmad Hamad, MD   20 mg at 01/26/15 2026  . divalproex (DEPAKOTE SPRINKLE) capsule 250 mg  250 mg Oral TID Alison MurrayAlma M Devine, MD   250 mg at 01/27/15 0953  . enoxaparin (LOVENOX) injection 40 mg  40 mg Subcutaneous Q24H Eston EstersAhmad Hamad, MD   40 mg at 01/26/15 2027  . haloperidol (HALDOL) tablet 0.5 mg  0.5 mg Oral QHS Alison MurrayAlma M Devine, MD   0.5 mg at 01/26/15 2026  . levofloxacin (LEVAQUIN) IVPB 750 mg  750 mg Intravenous Q48H Rollene Farerin R Williamson, RPH   750 mg at 01/25/15 2301   Do not use this list as official medication orders. Please verify with discharge summary.  Discharge Medications:   Medication List    STOP taking these medications        cephALEXin 500 MG capsule  Commonly known as:  KEFLEX     lisinopril 10 MG tablet  Commonly known as:  PRINIVIL,ZESTRIL      TAKE these medications        acetaminophen 500 MG tablet  Commonly known as:  TYLENOL  Take 500 mg by mouth every 4 (four) hours as needed for mild pain, moderate pain, fever or headache.     ALPRAZolam 0.25 MG tablet  Commonly known as:  XANAX  Take 1 tablet (0.25 mg total) by mouth 2 (two) times daily.     alum & mag hydroxide-simeth 200-200-20 MG/5ML suspension  Commonly known as:  MAALOX/MYLANTA  Take 30 mLs by mouth every 6 (six) hours as needed for indigestion or heartburn.     amLODipine 5 MG tablet  Commonly known as:  NORVASC  Take 1 tablet (5 mg total) by mouth daily.     aspirin 81 MG chewable tablet  Chew 81 mg by mouth daily.     atorvastatin 20 MG tablet  Commonly known as:  LIPITOR  Take 20 mg by  mouth at bedtime.     divalproex 125 MG capsule  Commonly known as:  DEPAKOTE SPRINKLE  Take 250 mg by mouth 3 (three) times daily.     guaifenesin 100 MG/5ML syrup  Commonly known as:  ROBITUSSIN  Take 200 mg by mouth every 6 (six) hours as needed for cough.     haloperidol 0.5 MG tablet  Commonly known as:  HALDOL  Take 1 tablet (0.5 mg total) by mouth at bedtime.     levofloxacin 500 MG tablet  Commonly known as:  LEVAQUIN  Take 1 tablet (500 mg total) by mouth daily.     LIPO-FLAVONOID PLUS PO  Take 1 tablet by mouth daily.     loperamide 2 MG capsule  Commonly known as:  IMODIUM  Take 2 mg by mouth as needed for diarrhea or loose stools (take 1 capsule as needed with each loose stool for diarrhea (not to exceed 8 doses in 24 hours)).     magnesium hydroxide 400 MG/5ML suspension  Commonly known as:  MILK OF MAGNESIA  Take 30 mLs by mouth at bedtime as needed for mild constipation or moderate constipation.     multivitamins ther. w/minerals Tabs tablet  Take 1 tablet by mouth daily.     neomycin-bacitracin-polymyxin [phone] ointment  Apply 1 application topically as needed (apply to minor skin tears or abrasions, cover with band aid/gauze and tape. change as needed until healed).     NUTRITIONAL DRINK Liqd  Take 1 Bottle by mouth 3 (three) times daily.     rivastigmine 1.5 MG capsule  Commonly known as:  EXELON  Take 3 mg by mouth 2 (two) times daily.        Relevant Imaging Results:  Relevant Lab Results:  Recent Labs    Additional Information Pt will need HHPT at Memory Care Unit to improve ambulatory status.  Kesley Mullens, Dickey Gave, LCSW

## 2015-01-27 NOTE — Discharge Instructions (Signed)

## 2015-01-27 NOTE — Progress Notes (Signed)
[facility]. Packet given to [facility], patient stable at time of discharge.

## 2015-01-27 NOTE — Discharge Summary (Signed)
Physician Discharge Summary  Pamela Ramirez WRU:[id number] DOB: 03-Dec-1921 DOA: 01/25/2015  PCP: Pcp Not In System pt from nursing home   Admit date: 01/25/2015 Discharge date: 01/27/2015  Recommendations for Outpatient Follow-up:  Continue Levaquin for 5 more days on discharge for treatment of possible pneumonia.  Discharge Diagnoses:  Principal Problem:   Acute encephalopathy Active Problems:   Sepsis ([place])   Dementia   Dyslipidemia   CKD (chronic kidney disease) stage 3, GFR 30-59 ml/min   Anemia of chronic kidney failure   Pulmonary embolism (HCC)    Discharge Condition: stable   Diet recommendation: as tolerated   History of present illness:  81 year old female with past medical history of dementia (non verbal at baseline), PE, HTN who presented to [facility] ED from SNF because of altered mental status and less responsive in past day prior to this admission. She has history of falls but there was no report of fall in SNF. In ED, BP was 75/37 and has improved to 153/60 with IV fluids. T was 95.8 F, RR 20. Blood work showed Cr of 1.18 (baseline 6 months ago 1.24). CXR and UA were unremarkable. She was started on empiric Levaquin and admitted for further evaluation of altered mental status.   Hospital Course:    Assessment/Plan:    Principal Problem:  Acute encephalopathy / Dementia without the behavioral disturbance - Stable - Non verbal - May continue haldol at bedtime  - To return to skilled nursing facility   Active Problems:  Sepsis, unspecified oraganism ([place]) - Sepsis criteria met on admission with hypothermia (T 95.8 F), tachypnea, hypotension, however no clear source of infection - CXR WNL - UA unremarkable - Blood and urine culture results - ho growth so far - Pt on empiric Levaquin which she will continue to 5 more days on discharge    Dyslipidemia - Continue Lipitor   Essential hypertension - Hold lisinopril due to renal insufficiency -  Started Norvasc 5 mg daily  - BP 142/71   CKD (chronic kidney disease) stage 3, GFR 30-59 ml/min - About 6 months ago Cr was 1.24 - Cr on this admission 1.18 - Would hold lisinopril until renal function within normal limits.   Anemia of chronic kidney failure - Hemoglobin 11.2, stable    Pulmonary embolism ([place]) - Has had PE in 2013 but on this admission, CT angio chest showed no PE. - She is on aspirin daily and not on Lovenox    Troponin level elevated - One set CE , troponin elevated at 0.07  - No sign of chest pain - Due to multiple comorbidities patient is not a candidate for aggressive interventional studies or diagnostic studies - Elevated troponin likely demand ischemia from chronic kidney disease - No need to cycle cardiac enzymes further    DVT Prophylaxis  - Lovenox subQ in hospital   Code Status: DNR/DNI Family Communication: plan of care discussed with the patient's granddaughter at the bedside    IV access:  Peripheral IV  Procedures and diagnostic studies:   Ct Angio Chest Pe W/cm &/or Wo Cm 01/25/2015 1. No evidence of pulmonary embolus. 2. Minimal nodularity within the posterior aspects of the upper lung lobes bilaterally, which may reflect a mild atypical infectious process, depending on the patient's symptoms. 3. Mild peripheral opacities in the lung bases may reflect atelectasis or scarring. 4. Scattered coronary artery calcification noted. 5. Scattered hypodensities within the liver appear relatively stable and likely reflect cysts, though not fully  imaged on the prior study. 6. Large left renal cysts noted. Tiny right renal cyst seen. 7. Scattered calcification along the abdominal aorta and its branches. 8. Minimal diverticulosis along the proximal sigmoid colon, without evidence of diverticulitis. Electronically Signed By: Garald Balding M.D. On: 01/25/2015 18:19   Ct Abdomen Pelvis W Contrast 01/25/2015 1. No evidence of pulmonary  embolus. 2. Minimal nodularity within the posterior aspects of the upper lung lobes bilaterally, which may reflect a mild atypical infectious process, depending on the patient's symptoms. 3. Mild peripheral opacities in the lung bases may reflect atelectasis or scarring. 4. Scattered coronary artery calcification noted. 5. Scattered hypodensities within the liver appear relatively stable and likely reflect cysts, though not fully imaged on the prior study. 6. Large left renal cysts noted. Tiny right renal cyst seen. 7. Scattered calcification along the abdominal aorta and its branches. 8. Minimal diverticulosis along the proximal sigmoid colon, without evidence of diverticulitis. Electronically Signed By: Garald Balding M.D. On: 01/25/2015 18:19   Dg Chest Port 1 View 01/25/2015 No evidence for acute abnormality. Electronically Signed By: Nolon Nations M.D. On: 01/25/2015 16:05    Medical Consultants:  None   Other Consultants:  Nutrition PT  IAnti-Infectives:   Levaquin 01/25/2015 --> for 5 days on discharge    Signed:  Leisa Lenz, MD  [facility] 01/27/2015, 10:09 AM  Pager #: [phone]  Time spent in minutes: more than 30 minutes    Discharge Exam: Filed Vitals:   01/27/15 0515  BP: 142/71  Pulse: 92  Temp: 99.4 F (37.4 C)  Resp: 18   Filed Vitals:   01/26/15 0950 01/26/15 1543 01/26/15 2131 01/27/15 0515  BP: 138/49 144/51 160/77 142/71  Pulse:  95 98 92  Temp:  98.5 F (36.9 C) 98.9 F (37.2 C) 99.4 F (37.4 C)  TempSrc:  Axillary Axillary Axillary  Resp:  18 18 18   Height:      Weight:      SpO2:  95% 94% 97%    General: Pt is  not in acute distress Cardiovascular: Regular rate and rhythm, S1/S2 + Respiratory: Clear to auscultation bilaterally, no wheezing, no crackles, no rhonchi Abdominal: Soft, non tender, non distended, bowel sounds +, no guarding Extremities: no edema, no cyanosis, pulses palpable bilaterally DP  and PT Neuro: Grossly nonfocal  Discharge Instructions  Discharge Instructions    Call MD for:  persistant dizziness or light-headedness    Complete by:  As directed      Call MD for:  persistant nausea and vomiting    Complete by:  As directed      Call MD for:  severe uncontrolled pain    Complete by:  As directed      Diet - low sodium heart healthy    Complete by:  As directed      Discharge instructions    Complete by:  As directed   Continue Levaquin for 5 more days on discharge for treatment of possible pneumonia.     Increase activity slowly    Complete by:  As directed             Medication List    STOP taking these medications        cephALEXin 500 MG capsule  Commonly known as:  KEFLEX     lisinopril 10 MG tablet  Commonly known as:  PRINIVIL,ZESTRIL      TAKE these medications        acetaminophen 500 MG tablet  Commonly  known as:  TYLENOL  Take 500 mg by mouth every 4 (four) hours as needed for mild pain, moderate pain, fever or headache.     ALPRAZolam 0.25 MG tablet  Commonly known as:  XANAX  Take 1 tablet (0.25 mg total) by mouth 2 (two) times daily.     alum & mag hydroxide-simeth 200-200-20 MG/5ML suspension  Commonly known as:  MAALOX/MYLANTA  Take 30 mLs by mouth every 6 (six) hours as needed for indigestion or heartburn.     amLODipine 5 MG tablet  Commonly known as:  NORVASC  Take 1 tablet (5 mg total) by mouth daily.     aspirin 81 MG chewable tablet  Chew 81 mg by mouth daily.     atorvastatin 20 MG tablet  Commonly known as:  LIPITOR  Take 20 mg by mouth at bedtime.     divalproex 125 MG capsule  Commonly known as:  DEPAKOTE SPRINKLE  Take 250 mg by mouth 3 (three) times daily.     guaifenesin 100 MG/5ML syrup  Commonly known as:  ROBITUSSIN  Take 200 mg by mouth every 6 (six) hours as needed for cough.     haloperidol 0.5 MG tablet  Commonly known as:  HALDOL  Take 1 tablet (0.5 mg total) by mouth at bedtime.      levofloxacin 500 MG tablet  Commonly known as:  LEVAQUIN  Take 1 tablet (500 mg total) by mouth daily.     LIPO-FLAVONOID PLUS PO  Take 1 tablet by mouth daily.     loperamide 2 MG capsule  Commonly known as:  IMODIUM  Take 2 mg by mouth as needed for diarrhea or loose stools (take 1 capsule as needed with each loose stool for diarrhea (not to exceed 8 doses in 24 hours)).     magnesium hydroxide 400 MG/5ML suspension  Commonly known as:  MILK OF MAGNESIA  Take 30 mLs by mouth at bedtime as needed for mild constipation or moderate constipation.     multivitamins ther. w/minerals Tabs tablet  Take 1 tablet by mouth daily.     neomycin-bacitracin-polymyxin [phone] ointment  Apply 1 application topically as needed (apply to minor skin tears or abrasions, cover with band aid/gauze and tape. change as needed until healed).     NUTRITIONAL DRINK Liqd  Take 1 Bottle by mouth 3 (three) times daily.     rivastigmine 1.5 MG capsule  Commonly known as:  EXELON  Take 3 mg by mouth 2 (two) times daily.          The results of significant diagnostics from this hospitalization (including imaging, microbiology, ancillary and laboratory) are listed below for reference.    Significant Diagnostic Studies: Ct Angio Chest Pe W/cm &/or Wo Cm  01/25/2015  CLINICAL DATA:  Acute onset of altered mental status and hypotension. Personal history of pulmonary embolus, not currently on anticoagulation. Initial encounter. EXAM: CT ANGIOGRAPHY CHEST CT ABDOMEN AND PELVIS WITH CONTRAST TECHNIQUE: Multidetector CT imaging of the chest was performed using the standard protocol during bolus administration of intravenous contrast. Multiplanar CT image reconstructions and MIPs were obtained to evaluate the vascular anatomy. Multidetector CT imaging of the abdomen and pelvis was performed using the standard protocol during bolus administration of intravenous contrast. CONTRAST:  29m OMNIPAQUE IOHEXOL 350 MG/ML  SOLN COMPARISON:  Chest radiograph performed earlier today at 3:36 p.m., and pelvic radiograph performed 11/20/2013; CTA of the chest performed 05/11/2011 FINDINGS: CTA CHEST FINDINGS There is no evidence of pulmonary  embolus. The previously noted chronic web within the pulmonary artery to the right lower lobe has largely resolved. Mild peripheral opacities at the lung bases may reflect atelectasis or scarring. Nodular densities along the right major fissure likely reflect lymph nodes. There is minimal nodularity within the posterior aspects of the upper lobes bilaterally, which may reflect a mild atypical infectious process. There is no evidence of significant focal consolidation, pleural effusion or pneumothorax. Visualized mediastinal nodes remain normal in size. Scattered coronary artery calcifications are seen. No pericardial effusion is identified. Scattered calcification is noted along the aortic arch. The great vessels are grossly unremarkable. No axillary lymphadenopathy is seen. The visualized portions of the thyroid gland are unremarkable in appearance. No acute osseous abnormalities are seen. CT ABDOMEN and PELVIS FINDINGS Scattered hypodensities within the liver, measuring up to 2.4 cm, are similar in appearance to 2013 and likely reflect cysts. The spleen is unremarkable in appearance. The gallbladder is within normal limits. The pancreas and adrenal glands are unremarkable. Large left renal cysts are noted, measuring up to 6.0 cm in size. A tiny right renal cyst is seen. There is no evidence of hydronephrosis. No renal or ureteral stones are seen. No perinephric stranding is appreciated. No free fluid is identified. The small bowel is unremarkable in appearance. The stomach is within normal limits. No acute vascular abnormalities are seen. There is mild ectasia of the infrarenal abdominal aorta, without evidence of aneurysmal dilatation. Scattered calcification is noted along the abdominal aorta and  its branches. The appendix is not definitely characterized; there is no evidence of appendicitis. Minimal diverticulosis is noted along the proximal sigmoid colon. The colon is otherwise unremarkable. The bladder is mildly distended and grossly unremarkable. The uterus is unremarkable in appearance. The ovaries are grossly symmetric. No suspicious adnexal masses are seen. No inguinal lymphadenopathy is seen. No acute osseous abnormalities are identified. Review of the MIP images confirms the above findings. IMPRESSION: 1. No evidence of pulmonary embolus. 2. Minimal nodularity within the posterior aspects of the upper lung lobes bilaterally, which may reflect a mild atypical infectious process, depending on the patient's symptoms. 3. Mild peripheral opacities in the lung bases may reflect atelectasis or scarring. 4. Scattered coronary artery calcification noted. 5. Scattered hypodensities within the liver appear relatively stable and likely reflect cysts, though not fully imaged on the prior study. 6. Large left renal cysts noted.  Tiny right renal cyst seen. 7. Scattered calcification along the abdominal aorta and its branches. 8. Minimal diverticulosis along the proximal sigmoid colon, without evidence of diverticulitis. Electronically Signed   By: Garald Balding M.D.   On: 01/25/2015 18:19   Ct Abdomen Pelvis W Contrast  01/25/2015  CLINICAL DATA:  Acute onset of altered mental status and hypotension. Personal history of pulmonary embolus, not currently on anticoagulation. Initial encounter. EXAM: CT ANGIOGRAPHY CHEST CT ABDOMEN AND PELVIS WITH CONTRAST TECHNIQUE: Multidetector CT imaging of the chest was performed using the standard protocol during bolus administration of intravenous contrast. Multiplanar CT image reconstructions and MIPs were obtained to evaluate the vascular anatomy. Multidetector CT imaging of the abdomen and pelvis was performed using the standard protocol during bolus administration of  intravenous contrast. CONTRAST:  27m OMNIPAQUE IOHEXOL 350 MG/ML SOLN COMPARISON:  Chest radiograph performed earlier today at 3:36 p.m., and pelvic radiograph performed 11/20/2013; CTA of the chest performed 05/11/2011 FINDINGS: CTA CHEST FINDINGS There is no evidence of pulmonary embolus. The previously noted chronic web within the pulmonary artery to the right lower  lobe has largely resolved. Mild peripheral opacities at the lung bases may reflect atelectasis or scarring. Nodular densities along the right major fissure likely reflect lymph nodes. There is minimal nodularity within the posterior aspects of the upper lobes bilaterally, which may reflect a mild atypical infectious process. There is no evidence of significant focal consolidation, pleural effusion or pneumothorax. Visualized mediastinal nodes remain normal in size. Scattered coronary artery calcifications are seen. No pericardial effusion is identified. Scattered calcification is noted along the aortic arch. The great vessels are grossly unremarkable. No axillary lymphadenopathy is seen. The visualized portions of the thyroid gland are unremarkable in appearance. No acute osseous abnormalities are seen. CT ABDOMEN and PELVIS FINDINGS Scattered hypodensities within the liver, measuring up to 2.4 cm, are similar in appearance to 2013 and likely reflect cysts. The spleen is unremarkable in appearance. The gallbladder is within normal limits. The pancreas and adrenal glands are unremarkable. Large left renal cysts are noted, measuring up to 6.0 cm in size. A tiny right renal cyst is seen. There is no evidence of hydronephrosis. No renal or ureteral stones are seen. No perinephric stranding is appreciated. No free fluid is identified. The small bowel is unremarkable in appearance. The stomach is within normal limits. No acute vascular abnormalities are seen. There is mild ectasia of the infrarenal abdominal aorta, without evidence of aneurysmal  dilatation. Scattered calcification is noted along the abdominal aorta and its branches. The appendix is not definitely characterized; there is no evidence of appendicitis. Minimal diverticulosis is noted along the proximal sigmoid colon. The colon is otherwise unremarkable. The bladder is mildly distended and grossly unremarkable. The uterus is unremarkable in appearance. The ovaries are grossly symmetric. No suspicious adnexal masses are seen. No inguinal lymphadenopathy is seen. No acute osseous abnormalities are identified. Review of the MIP images confirms the above findings. IMPRESSION: 1. No evidence of pulmonary embolus. 2. Minimal nodularity within the posterior aspects of the upper lung lobes bilaterally, which may reflect a mild atypical infectious process, depending on the patient's symptoms. 3. Mild peripheral opacities in the lung bases may reflect atelectasis or scarring. 4. Scattered coronary artery calcification noted. 5. Scattered hypodensities within the liver appear relatively stable and likely reflect cysts, though not fully imaged on the prior study. 6. Large left renal cysts noted.  Tiny right renal cyst seen. 7. Scattered calcification along the abdominal aorta and its branches. 8. Minimal diverticulosis along the proximal sigmoid colon, without evidence of diverticulitis. Electronically Signed   By: Garald Balding M.D.   On: 01/25/2015 18:19   Dg Chest Port 1 View  01/25/2015  CLINICAL DATA:  AMS, hypotension. Hx htn. nonsmoker EXAM: PORTABLE CHEST 1 VIEW COMPARISON:  06/10/2014 FINDINGS: Heart size is normal. No focal consolidations or pleural effusions. No pulmonary edema. Patient is rotated towards the right. Degenerative changes in both shoulders. IMPRESSION: No evidence for acute  abnormality. Electronically Signed   By: Nolon Nations M.D.   On: 01/25/2015 16:05    Microbiology: Recent Results (from the past 240 hour(s))  Blood culture (routine x 2)     Status: None  (Preliminary result)   Collection Time: 01/25/15  3:15 PM  Result Value Ref Range Status   Specimen Description BLOOD LEFT EXTERNAL JUGULAR  5 ML IN [facility] BOTTLE  Final   Special Requests NONE  Final   Culture   Final    NO GROWTH < 24 HOURS Performed at [facility]    Report Status PENDING  Incomplete  Urine culture     Status: None (Preliminary result)   Collection Time: 01/25/15  3:51 PM  Result Value Ref Range Status   Specimen Description URINE, CATHETERIZED  Final   Special Requests NONE  Final   Culture   Final    NO GROWTH < 24 HOURS Performed at [facility]    Report Status PENDING  Incomplete  Blood culture (routine x 2)     Status: None (Preliminary result)   Collection Time: 01/25/15  4:08 PM  Result Value Ref Range Status   Specimen Description BLOOD RIGHT HAND  10 ML IN [facility] BOTTLE  Final   Special Requests NONE  Final   Culture   Final    NO GROWTH < 24 HOURS Performed at [facility]    Report Status PENDING  Incomplete  MRSA PCR Screening     Status: None   Collection Time: 01/25/15 10:56 PM  Result Value Ref Range Status   MRSA by PCR NEGATIVE NEGATIVE Final    Comment:        The GeneXpert MRSA Assay (FDA approved for NASAL specimens only), is one component of a comprehensive MRSA colonization surveillance program. It is not intended to diagnose MRSA infection nor to guide or monitor treatment for MRSA infections.      Labs: Basic Metabolic Panel:  Recent Labs Lab 01/25/15 1450 01/26/15 0824  NA 142 141  K 4.8 4.3  CL 107 112*  CO2 28 23  GLUCOSE 93 98  BUN 30* 25*  CREATININE 1.18* 1.06*  CALCIUM 9.4 8.5*   Liver Function Tests:  Recent Labs Lab 01/25/15 1450  AST 29  ALT 41  ALKPHOS 59  BILITOT 0.5  PROT 6.4*  ALBUMIN 3.5   No results for input(s): LIPASE, AMYLASE in the last 168 hours. No results for input(s): AMMONIA in the last 168 hours. CBC:  Recent Labs Lab 01/25/15 1450  WBC 5.0   NEUTROABS 2.5  HGB 11.2*  HCT 34.1*  MCV 96.9  PLT 179   Cardiac Enzymes:  Recent Labs Lab 01/25/15 1450 01/25/15 2059 01/26/15 0824  TROPONINI <0.03 0.03 0.07*   BNP: BNP (last 3 results) No results for input(s): BNP in the last 8760 hours.  ProBNP (last 3 results) No results for input(s): PROBNP in the last 8760 hours.  CBG: No results for input(s): GLUCAP in the last 168 hours.

## 2015-01-27 NOTE — Progress Notes (Signed)
Initial Nutrition Assessment  DOCUMENTATION CODES:   Not applicable  INTERVENTION:  - Continue Ensure Enlive BID, each supplement provides 350 kcal and 20 grams of protein - RD will continue to monitor for needs  NUTRITION DIAGNOSIS:   Inadequate oral intake related to poor appetite as evidenced by per patient/family report.  GOAL:   Patient will meet greater than or equal to 90% of their needs  MONITOR:   PO intake, Supplement acceptance, Weight trends, Labs, Skin, I & O's  REASON FOR ASSESSMENT:   Consult Assessment of nutrition requirement/status  ASSESSMENT:   Patient is a 81 yo female with history of dementia ( non verbal at baseline), PE, HTN, who was brought by EMS from NH due to altered mental status ( she is non verbal at baseline but still responds to pain stimuli and look at people around her, sometime combative when forced to eat but this changed today).   Pt seen for consult. BMI indicates normal weight status. Per chart review, pt ate 5% of dinner last night. Pt unable to provide information due to hx of dementia and nonverbal status, but family member is at bedside and provided all information.  At home pt was consuming a pureed diet due to no teeth and unable to chew due to this. Although Regular diet order in place, pt has been receiving pureed food since admission. Breakfast tray arrived during the course of RD visit and reflects pureed diet. Family member was about to start feeding pt. She states that pt's intakes are variable although pt usually does not eat much at any particular meal.   Family member requests physical assessment not be done as pt getting ready to eat. She states pt's weight has been stable for the past few years with no significant fluctuation from CBW. No weight hx available for comparison.  Pt was drinking Ensure PTA and has been consuming it during admission, especially with medications. She drank ~50% of Ensure Enlive this AM.    Unsure if pt was meeting needs PTA. Medications reviewed. Labs reviewed; Cl: 112 mmol/L, BUN/creatinine elevated but trending down, Ca: 8.5 mg/dL, GFR: 52.   Diet Order:  Diet regular Room service appropriate?: Yes; Fluid consistency:: Thin Diet - low sodium heart healthy  Skin:  Reviewed, no issues  Last BM:  11/14  Height:   Ht Readings from Last 1 Encounters:  01/25/15 5\' 5"  (1.651 m)    Weight:   Wt Readings from Last 1 Encounters:  01/25/15 134 lb 7.7 oz (61 kg)    Ideal Body Weight:  56.82 kg (kg)  BMI:  Body mass index is 22.38 kg/(m^2).  Estimated Nutritional Needs:   Kcal:  1220-1420  Protein:  48-55 grams  Fluid:  2 L/day  EDUCATION NEEDS:   No education needs identified at this time     Trenton GammonJessica Dawn Kiper, RD, LDN Inpatient Clinical Dietitian Pager # [phone] After hours/weekend pager # [phone]

## 2015-01-27 NOTE — Evaluation (Signed)
Physical Therapy Evaluation Patient Details Name: Pamela Ramirez MRN: [id number] DOB: 05-Oct-1921 Today's Date: 01/27/2015   History of Present Illness  81 yo female admitted acute encephalopathy. hx of Alz, HTN, osteoporosis, PE, falls  Clinical Impression  On eval, pt required Mod assist +2 for mobility. Max multimodal cueing and assistance to encourage participation.  Incontinent of bladder and bowels during session-total assist for hygiene. Once standing, pt was able to stand at EOB with Min assist and 1 HHA. Attempted ambulation/stand pivot but pt unwilling to participate. No family present during session.    Follow Up Recommendations Supervision/Assistance - 24 hour. SNF vs ALF depending on facility's ability to provide current level of care.     Equipment Recommendations  None recommended by PT    Recommendations for Other Services       Precautions / Restrictions Precautions Precautions: Fall Restrictions Weight Bearing Restrictions: No      Mobility  Bed Mobility Overal bed mobility: Needs Assistance Bed Mobility: Supine to Sit     Supine to sit: Mod assist;+2 for physical assistance;+2 for safety/equipment;HOB elevated     General bed mobility comments: Max encouragement and cueing for participaton. Assist for trunk and bil LEs. Increased time. Utilized bedpad to aid with scooting, positioning.   Transfers Overall transfer level: Needs assistance Equipment used: 2 person hand held assist Transfers: Sit to/from Stand Sit to Stand: Mod assist;+2 physical assistance;+2 safety/equipment         General transfer comment: Assist to rise, stabilize, control descent. Max encouragement for participation[facility]   Ambulation/Gait             General Gait Details: Unable to get pt to ambulate. Static standing with Min assist and 1 HHA.   Stairs            Wheelchair Mobility    Modified Rankin (Stroke  Patients Only)       Balance Overall balance assessment: Needs assistance;History of Falls Sitting-balance support: Feet supported Sitting balance-Leahy Scale: Fair     Standing balance support: During functional activity Standing balance-Leahy Scale: Poor Standing balance comment: Pt stood statically with Min assist and 1 HHA. Unable to assess dynamic balance/gait on this session                             Pertinent Vitals/Pain Pain Assessment: Faces Faces Pain Scale: No hurt    Home Living Family/patient expects to be discharged to:: Unsure                 Additional Comments: pt unable to provide PLOF information    Prior Function           Comments: per chart, pt was ambulatory at facility     Hand Dominance        Extremity/Trunk Assessment   Upper Extremity Assessment: Difficult to assess due to impaired cognition           Lower Extremity Assessment: Difficult to assess due to impaired cognition      Cervical / Trunk Assessment: Normal  Communication   Communication: Expressive difficulties  Cognition Arousal/Alertness: Awake/alert Behavior During Therapy: WFL for tasks assessed/performed Overall Cognitive Status: History of cognitive impairments - at baseline                      General Comments      Exercises  Assessment/Plan    PT Assessment Patient needs continued PT services  PT Diagnosis Difficulty walking;Abnormality of gait;Generalized weakness;Altered mental status   PT Problem List Decreased mobility;Decreased balance;Decreased cognition;Decreased activity tolerance  PT Treatment Interventions DME instruction;Gait training;Functional mobility training;Therapeutic activities;Patient/family education;Balance training;Therapeutic exercise   PT Goals (Current goals can be found in the Care Plan section) Acute Rehab PT Goals Patient Stated Goal: none stated-pt unable Time For Goal Achievement:  02/10/15 Potential to Achieve Goals: Poor    Frequency Min 3X/week   Barriers to discharge        Co-evaluation               End of Session   Activity Tolerance: Patient tolerated treatment well (Limited by cognition/unwillingess to participate) Patient left: in bed;with call bell/phone within reach;with bed alarm set      Functional Assessment Tool Used: clinical judgement Functional Limitation: Mobility: Walking and moving around Mobility: Walking and Moving Around Current Status (Z6109): At least 40 percent but less than 60 percent impaired, limited or restricted Mobility: Walking and Moving Around Goal Status [phone]): At least 20 percent but less than 40 percent impaired, limited or restricted    Time: 1021-1046 PT Time Calculation (min) (ACUTE ONLY): 25 min   Charges:   PT Evaluation $Initial PT Evaluation Tier I: 1 Procedure     PT G Codes:   PT G-Codes **NOT FOR INPATIENT CLASS** Functional Assessment Tool Used: clinical judgement Functional Limitation: Mobility: Walking and moving around Mobility: Walking and Moving Around Current Status (U9811): At least 40 percent but less than 60 percent impaired, limited or restricted Mobility: Walking and Moving Around Goal Status [phone]): At least 20 percent but less than 40 percent impaired, limited or restricted    Rebeca Alert, MPT Pager: [phone]

## 2015-01-30 LAB — CULTURE, BLOOD (ROUTINE X 2)
CULTURE: NO GROWTH
Culture: NO GROWTH

## 2015-03-25 ENCOUNTER — Encounter (HOSPITAL_COMMUNITY): Payer: Self-pay | Admitting: Emergency Medicine

## 2015-03-25 ENCOUNTER — Emergency Department (HOSPITAL_COMMUNITY): Payer: Medicare Other

## 2015-03-25 ENCOUNTER — Emergency Department (HOSPITAL_COMMUNITY)
Admission: EM | Admit: 2015-03-25 | Discharge: 2015-03-25 | Disposition: A | Discharge status: Home / Self Care | Payer: Medicare Other | Attending: Emergency Medicine | Admitting: Emergency Medicine

## 2015-03-25 DIAGNOSIS — Z79899 Other long term (current) drug therapy: Secondary | ICD-10-CM | POA: Insufficient documentation

## 2015-03-25 DIAGNOSIS — W01198A Fall on same level from slipping, tripping and stumbling with subsequent striking against other object, initial encounter: Secondary | ICD-10-CM | POA: Insufficient documentation

## 2015-03-25 DIAGNOSIS — Z7982 Long term (current) use of aspirin: Secondary | ICD-10-CM | POA: Diagnosis not present

## 2015-03-25 DIAGNOSIS — Y92129 Unspecified place in nursing home as the place of occurrence of the external cause: Secondary | ICD-10-CM | POA: Insufficient documentation

## 2015-03-25 DIAGNOSIS — Y9389 Activity, other specified: Secondary | ICD-10-CM | POA: Diagnosis not present

## 2015-03-25 DIAGNOSIS — Z043 Encounter for examination and observation following other accident: Secondary | ICD-10-CM | POA: Insufficient documentation

## 2015-03-25 DIAGNOSIS — Y998 Other external cause status: Secondary | ICD-10-CM | POA: Insufficient documentation

## 2015-03-25 DIAGNOSIS — W19XXXA Unspecified fall, initial encounter: Secondary | ICD-10-CM

## 2015-03-25 DIAGNOSIS — F0390 Unspecified dementia without behavioral disturbance: Secondary | ICD-10-CM | POA: Diagnosis not present

## 2015-03-25 NOTE — ED Notes (Signed)
Attempted to update patient's vital signs. Patient guarding her arms, not wanting this RN to take her vital signs.

## 2015-03-25 NOTE — ED Provider Notes (Signed)
CSN: [id number]     Arrival date & time 03/25/15  1710 History   First MD Initiated Contact with Patient 03/25/15 1732     Chief Complaint  Patient presents with  . Fall     (Consider location/radiation/quality/duration/timing/severity/associated sxs/prior Treatment) HPI Level V caveat due to dementia. 82 year old female with dementia who presents after fall. No blood thinners. Nursing staff helping her to dress when she became combative, fell, and strike back of head on handle of dresser. No LOC. Baseline mental status per nursing staff. Non-verbal  at baseline.    History reviewed. No pertinent past medical history. History reviewed. No pertinent past surgical history. History reviewed. No pertinent family history. Social History  Substance Use Topics  . Smoking status: Never Smoker   . Smokeless tobacco: None  . Alcohol Use: No   OB History    No data available     Review of Systems  Unable to perform ROS: Dementia      Allergies  Review of patient's allergies indicates no known allergies.  Home Medications   Prior to Admission medications   Medication Sig Start Date End Date Taking? Authorizing Provider  acetaminophen (TYLENOL) 500 MG tablet Take 500 mg by mouth every 4 (four) hours as needed for mild pain, moderate pain, fever or headache.   Yes Historical Provider, MD  acetaminophen (TYLENOL) 650 MG CR tablet Take 650 mg by mouth 3 (three) times daily.   Yes Historical Provider, MD  ALPRAZolam (XANAX) 0.25 MG tablet Take 1 tablet (0.25 mg total) by mouth 2 (two) times daily. 01/27/15  Yes Alison Murray, MD  alum & mag hydroxide-simeth (MAALOX/MYLANTA) 200-200-20 MG/5ML suspension Take 30 mLs by mouth every 6 (six) hours as needed for indigestion or heartburn.   Yes Historical Provider, MD  amLODipine (NORVASC) 5 MG tablet Take 1 tablet (5 mg total) by mouth daily. 01/27/15  Yes Alison Murray, MD  aspirin 81 MG chewable tablet Chew 81 mg by mouth daily.   Yes  Historical Provider, MD  atorvastatin (LIPITOR) 20 MG tablet Take 20 mg by mouth at bedtime.    Yes Historical Provider, MD  divalproex (DEPAKOTE SPRINKLE) 125 MG capsule Take 250 mg by mouth 3 (three) times daily.   Yes Historical Provider, MD  guaifenesin (ROBITUSSIN) 100 MG/5ML syrup Take 200 mg by mouth every 6 (six) hours as needed for cough.   Yes Historical Provider, MD  haloperidol (HALDOL) 0.5 MG tablet Take 1 tablet (0.5 mg total) by mouth at bedtime. 01/27/15  Yes Alison Murray, MD  hydrocerin (EUCERIN) CREA Apply 1 application topically 2 (two) times daily. Apply all over body twice daily   Yes Historical Provider, MD  loperamide (IMODIUM) 2 MG capsule Take 2 mg by mouth as needed for diarrhea or loose stools (take 1 capsule as needed with each loose stool for diarrhea (not to exceed 8 doses in 24 hours)).    Yes Historical Provider, MD  magnesium hydroxide (MILK OF MAGNESIA) 400 MG/5ML suspension Take 30 mLs by mouth at bedtime as needed for mild constipation or moderate constipation.   Yes Historical Provider, MD  Multiple Vitamins-Minerals (MULTIVITAMINS THER. W/MINERALS) TABS Take 1 tablet by mouth daily.   Yes Historical Provider, MD  neomycin-bacitracin-polymyxin (NEOSPORIN) [phone] ointment Apply 1 application topically as needed (apply to minor skin tears or abrasions, cover with band aid/gauze and tape. change as needed until healed).   Yes Historical Provider, MD  Nutritional Supplements (NUTRITIONAL DRINK) LIQD Take 1  Bottle by mouth 3 (three) times daily.    Yes Historical Provider, MD  rivastigmine (EXELON) 3 MG capsule Take 3 mg by mouth 2 (two) times daily.   Yes Historical Provider, MD  Vitamins-Lipotropics (LIPO-FLAVONOID PLUS PO) Take 1 tablet by mouth daily.   Yes Historical Provider, MD   BP 136/99 mmHg  Pulse 88  Resp 18  SpO2 95% Physical Exam Physical Exam  Nursing note and vitals reviewed. Constitutional: Well developed elderly woman, non-toxic, and in no  acute distress Head: Normocephalic and atraumatic.  Mouth/Throat: Oropharynx is clear and moist.  Neck:  Cervical collar in place initially, when cleared with normal range of motion. No step offs or tenderness. Cardiovascular: Normal rate and regular rhythm.   Pulmonary/Chest: Effort normal and breath sounds normal. No chest wall tenderness. Abdominal: Soft. There is no tenderness. There is no rebound and no guarding.  Musculoskeletal: Normal range of motion of all 4 extremities. No deformities or tenderness to palpation.  Neurological: Alert, moves all extremities symmetrically Skin: Skin is warm and dry.  Psychiatric: Cooperative  ED Course  Procedures (including critical care time) Labs Review Labs Reviewed - No data to display  Imaging Review Ct Head Wo Contrast  03/25/2015  CLINICAL DATA:  Fall, striking back of head.  Initial encounter. EXAM: CT HEAD WITHOUT CONTRAST CT CERVICAL SPINE WITHOUT CONTRAST TECHNIQUE: Multidetector CT imaging of the head and cervical spine was performed following the standard protocol without intravenous contrast. Multiplanar CT image reconstructions of the cervical spine were also generated. COMPARISON:  12/24/2014 FINDINGS: CT HEAD FINDINGS Stable ex vacuo dilatation of the lateral ventricles and surrounding small vessel disease. The brain demonstrates no evidence of hemorrhage, acute infarction, edema, mass effect, extra-axial fluid collection, hydrocephalus or mass lesion. The skull is unremarkable. CT CERVICAL SPINE FINDINGS The cervical spine shows normal alignment. There is no evidence of acute fracture or subluxation. No soft tissue swelling or hematoma is identified. Stable cervical spondylosis that C2-3, C3-4, C4-5 and C5-6. No bony or soft tissue lesions are seen. The visualized airway is normally patent. IMPRESSION: 1. Stable head CT.  No acute findings. 2. Stable cervical CT demonstrates spondylosis and no evidence of cervical fracture or subluxation.  Electronically Signed   By: Irish LackGlenn  Yamagata M.D.   On: 03/25/2015 18:33   Ct Cervical Spine Wo Contrast  03/25/2015  CLINICAL DATA:  Fall, striking back of head.  Initial encounter. EXAM: CT HEAD WITHOUT CONTRAST CT CERVICAL SPINE WITHOUT CONTRAST TECHNIQUE: Multidetector CT imaging of the head and cervical spine was performed following the standard protocol without intravenous contrast. Multiplanar CT image reconstructions of the cervical spine were also generated. COMPARISON:  12/24/2014 FINDINGS: CT HEAD FINDINGS Stable ex vacuo dilatation of the lateral ventricles and surrounding small vessel disease. The brain demonstrates no evidence of hemorrhage, acute infarction, edema, mass effect, extra-axial fluid collection, hydrocephalus or mass lesion. The skull is unremarkable. CT CERVICAL SPINE FINDINGS The cervical spine shows normal alignment. There is no evidence of acute fracture or subluxation. No soft tissue swelling or hematoma is identified. Stable cervical spondylosis that C2-3, C3-4, C4-5 and C5-6. No bony or soft tissue lesions are seen. The visualized airway is normally patent. IMPRESSION: 1. Stable head CT.  No acute findings. 2. Stable cervical CT demonstrates spondylosis and no evidence of cervical fracture or subluxation. Electronically Signed   By: Irish LackGlenn  Yamagata M.D.   On: 03/25/2015 18:33   I have personally reviewed and evaluated these images and lab results as part of  my medical decision-making.   EKG Interpretation None      MDM   Final diagnoses:  Fall, initial encounter    82 year old female with dementia who presents after witnessed fall with head strike. She is at her baseline mental status according to nursing home. No obvious injuries are noted on exam. CT head and cervical spine was performed, showing no acute traumatic injuries of the head or neck. Cervical collar was cleared, and she is noted to have normal range of motion of her neck without significant pain.  Appropriate for discharge back to nursing facility.    Lavera Guise, MD 03/25/15 [phone]

## 2015-03-25 NOTE — ED Notes (Signed)
Bed: WA23 Expected date:  Expected time:  Means of arrival:  Comments: Ems-fall 

## 2015-03-25 NOTE — ED Notes (Signed)
Unable to obtain vital signs upon [facility] arrival. Patient agitated and not allowing.

## 2015-03-25 NOTE — ED Notes (Signed)
Per EMS-states witnessed fall-nursing staff was helping her dress when she became combative and fell back striking head on handle of dresser-no LOC-at baseline mentally per nursing home-patient is nonverbal, which is normal-no obvious injuries

## 2015-03-25 NOTE — Discharge Instructions (Signed)
Please return for worsening symptoms, including confusion, worsening pain, or any other symptoms concerning to you.   Fall Prevention, Adult As a hospital patient, your condition and the treatments you receive can increase your risk for falls. Some additional risk factors for falls in a hospital include:  Being in an unfamiliar environment.  Being on bed rest.  Your surgery.  Taking certain medicines.  Your tubing requirements, such as intravenous (IV) therapy or catheters. It is important that you learn how to decrease fall risks while at the hospital. Below are important tips that can help prevent falls. SAFETY TIPS FOR PREVENTING FALLS Talk about your risk of falling.  Ask your health care provider why you are at risk for falling. Is it your medicine, illness, tubing placement, or something else?  Make a plan with your health care provider to keep you safe from falls.  Ask your health care provider or pharmacist about side effects of your medicines. Some medicines can make you dizzy or affect your coordination. Ask for help.  Ask for help before getting out of bed. You may need to press your call button.  Ask for assistance in getting safely to the toilet.  Ask for a walker or cane to be put at your bedside. Ask that most of the side rails on your bed be placed up before your health care provider leaves the room.  Ask family or friends to sit with you.  Ask for things that are out of your reach, such as your glasses, hearing aids, telephone, bedside table, or call button. Follow these tips to avoid falling:  Stay lying or seated, rather than standing, while waiting for help.  Wear rubber-soled slippers or shoes whenever you walk in the hospital.  Avoid quick, sudden movements.  Change positions slowly.  Sit on the side of your bed before standing.  Stand up slowly and wait before you start to walk.  Let your health care provider know if there is a spill on the  floor.  Pay careful attention to the medical equipment, electrical cords, and tubes around you.  When you need help, use your call button by your bed or in the bathroom. Wait for one of your health care providers to help you.  If you feel dizzy or unsure of your footing, return to bed and wait for assistance.  Avoid being distracted by the TV, telephone, or another person in your room.  Do not lean or support yourself on rolling objects, such as IV poles or bedside tables.   This information is not intended to replace advice given to you by your health care provider. Make sure you discuss any questions you have with your health care provider.   Document Released: 02/27/2000 Document Revised: 03/22/2014 Document Reviewed: 11/07/2011 Elsevier Interactive Patient Education [employer]2016 [employer]

## 2015-04-11 ENCOUNTER — Encounter (HOSPITAL_COMMUNITY): Payer: Self-pay | Admitting: Emergency Medicine

## 2015-04-11 ENCOUNTER — Emergency Department (HOSPITAL_COMMUNITY): Payer: Medicare Other

## 2015-04-11 ENCOUNTER — Emergency Department (HOSPITAL_COMMUNITY)
Admission: EM | Admit: 2015-04-11 | Discharge: 2015-04-11 | Disposition: A | Discharge status: Home / Self Care | Payer: Medicare Other | Attending: Emergency Medicine | Admitting: Emergency Medicine

## 2015-04-11 DIAGNOSIS — Y92128 Other place in nursing home as the place of occurrence of the external cause: Secondary | ICD-10-CM | POA: Diagnosis not present

## 2015-04-11 DIAGNOSIS — K219 Gastro-esophageal reflux disease without esophagitis: Secondary | ICD-10-CM | POA: Insufficient documentation

## 2015-04-11 DIAGNOSIS — Y998 Other external cause status: Secondary | ICD-10-CM | POA: Diagnosis not present

## 2015-04-11 DIAGNOSIS — S0003XA Contusion of scalp, initial encounter: Secondary | ICD-10-CM | POA: Diagnosis not present

## 2015-04-11 DIAGNOSIS — F0390 Unspecified dementia without behavioral disturbance: Secondary | ICD-10-CM | POA: Diagnosis not present

## 2015-04-11 DIAGNOSIS — Y9389 Activity, other specified: Secondary | ICD-10-CM | POA: Diagnosis not present

## 2015-04-11 DIAGNOSIS — N189 Chronic kidney disease, unspecified: Secondary | ICD-10-CM | POA: Insufficient documentation

## 2015-04-11 DIAGNOSIS — Z7982 Long term (current) use of aspirin: Secondary | ICD-10-CM | POA: Diagnosis not present

## 2015-04-11 DIAGNOSIS — Z79899 Other long term (current) drug therapy: Secondary | ICD-10-CM | POA: Insufficient documentation

## 2015-04-11 DIAGNOSIS — W19XXXA Unspecified fall, initial encounter: Secondary | ICD-10-CM

## 2015-04-11 DIAGNOSIS — S0990XA Unspecified injury of head, initial encounter: Secondary | ICD-10-CM | POA: Diagnosis present

## 2015-04-11 DIAGNOSIS — E119 Type 2 diabetes mellitus without complications: Secondary | ICD-10-CM | POA: Insufficient documentation

## 2015-04-11 DIAGNOSIS — F419 Anxiety disorder, unspecified: Secondary | ICD-10-CM | POA: Insufficient documentation

## 2015-04-11 DIAGNOSIS — Z8739 Personal history of other diseases of the musculoskeletal system and connective tissue: Secondary | ICD-10-CM | POA: Insufficient documentation

## 2015-04-11 DIAGNOSIS — W07XXXA Fall from chair, initial encounter: Secondary | ICD-10-CM | POA: Diagnosis not present

## 2015-04-11 HISTORY — DX: Anxiety disorder, unspecified: F41.9

## 2015-04-11 HISTORY — DX: Chronic kidney disease, unspecified: N18.9

## 2015-04-11 HISTORY — DX: Type 2 diabetes mellitus without complications: E11.9

## 2015-04-11 HISTORY — DX: Age-related osteoporosis without current pathological fracture: M81.0

## 2015-04-11 HISTORY — DX: Unspecified dementia, unspecified severity, without behavioral disturbance, psychotic disturbance, mood disturbance, and anxiety: F03.90

## 2015-04-11 HISTORY — DX: Gastro-esophageal reflux disease without esophagitis: K21.9

## 2015-04-11 NOTE — ED Notes (Signed)
Bed: WA06 Expected date:  Expected time:  Means of arrival:  Comments: Ems-89 fall, dementia

## 2015-04-11 NOTE — ED Notes (Signed)
PT would not allow me to check to make sure if she need to be change.

## 2015-04-11 NOTE — Progress Notes (Signed)
CSW attempted to speak with patient at bedside. Patient was asleep and she did not wake up when CSW called her name.  Elenore Paddy [phone] ED CSW 04/11/2015 12:15 PM

## 2015-04-11 NOTE — ED Notes (Signed)
Per EMS. Pt from [facility] nursing home. Hx of dementia, oriented per norm (disoriented 4x, inappropriate statements only). Staff witnessed pt fall out of a chair striking forehead on tile floor. No LOC. Pt has hematoma to R forehead. Not on blood thinners. Pt has been hitting EMS staff whenever bothered which is normal for pt.

## 2015-04-11 NOTE — ED Notes (Signed)
Report called to [facility] 

## 2015-04-11 NOTE — ED Provider Notes (Signed)
CSN: [id number]     Arrival date & time 04/11/15  1101 History   First MD Initiated Contact with Patient 04/11/15 1105     Chief Complaint  Patient presents with  . Fall     (Consider location/radiation/quality/duration/timing/severity/associated sxs/prior Treatment) HPI Low 5 caveat due to dementia. Patient lives in nursing home. Unwitnessed fall from chair striking her forehead on the floor. There was no loss of consciousness. Transmitted by EMS to the emergency department. Placed in cervical collar. Patient is at her baseline mental status. Unable to contribute history. Past Medical History  Diagnosis Date  . Dementia   . Chronic kidney disease   . Osteoporosis   . GERD (gastroesophageal reflux disease)   . Anxiety   . Diabetes type 2, controlled (HCC)    History reviewed. No pertinent past surgical history. History reviewed. No pertinent family history. Social History  Substance Use Topics  . Smoking status: Never Smoker   . Smokeless tobacco: None  . Alcohol Use: No   OB History    No data available     Review of Systems  Unable to perform ROS: Dementia      Allergies  Review of patient's allergies indicates no known allergies.  Home Medications   Prior to Admission medications   Medication Sig Start Date End Date Taking? Authorizing Provider  acetaminophen (TYLENOL) 500 MG tablet Take 500 mg by mouth every 4 (four) hours as needed for mild pain, moderate pain, fever or headache.   Yes Historical Provider, MD  acetaminophen (TYLENOL) 650 MG CR tablet Take 650 mg by mouth 3 (three) times daily.   Yes Historical Provider, MD  acetaminophen (TYLENOL) 650 MG CR tablet Take 650 mg by mouth 3 (three) times daily.   Yes Historical Provider, MD  ALPRAZolam (XANAX) 0.25 MG tablet Take 1 tablet (0.25 mg total) by mouth 2 (two) times daily. 01/27/15  Yes Alison Murray, MD  alum & mag hydroxide-simeth (MAALOX/MYLANTA) 200-200-20 MG/5ML suspension Take 30 mLs by mouth every  6 (six) hours as needed for indigestion or heartburn.   Yes Historical Provider, MD  aspirin 81 MG chewable tablet Chew 81 mg by mouth daily.   Yes Historical Provider, MD  atorvastatin (LIPITOR) 20 MG tablet Take 20 mg by mouth at bedtime.    Yes Historical Provider, MD  divalproex (DEPAKOTE SPRINKLE) 125 MG capsule Take 250 mg by mouth 3 (three) times daily.   Yes Historical Provider, MD  guaifenesin (ROBITUSSIN) 100 MG/5ML syrup Take 200 mg by mouth every 6 (six) hours as needed for cough.   Yes Historical Provider, MD  haloperidol (HALDOL) 0.5 MG tablet Take 1 tablet (0.5 mg total) by mouth at bedtime. 01/27/15  Yes Alison Murray, MD  lisinopril (PRINIVIL,ZESTRIL) 10 MG tablet Take 10 mg by mouth daily.   Yes Historical Provider, MD  loperamide (IMODIUM) 2 MG capsule Take 2 mg by mouth as needed for diarrhea or loose stools (take 1 capsule as needed with each loose stool for diarrhea (not to exceed 8 doses in 24 hours)).    Yes Historical Provider, MD  magnesium hydroxide (MILK OF MAGNESIA) 400 MG/5ML suspension Take 30 mLs by mouth at bedtime as needed for mild constipation or moderate constipation.   Yes Historical Provider, MD  Multiple Vitamins-Minerals (MULTIVITAMINS THER. W/MINERALS) TABS Take 1 tablet by mouth daily.   Yes Historical Provider, MD  neomycin-bacitracin-polymyxin (NEOSPORIN) [phone] ointment Apply 1 application topically as needed (apply to minor skin tears or abrasions, cover with  band aid/gauze and tape. change as needed until healed).   Yes Historical Provider, MD  Nutritional Supplements (NUTRITIONAL DRINK) LIQD Take 1 Bottle by mouth 3 (three) times daily.    Yes Historical Provider, MD  rivastigmine (EXELON) 1.5 MG capsule Take 1.5 mg by mouth 2 (two) times daily.   Yes Historical Provider, MD  Vitamins-Lipotropics (LIPO-FLAVONOID PLUS PO) Take 1 tablet by mouth daily.   Yes Historical Provider, MD  amLODipine (NORVASC) 5 MG tablet Take 1 tablet (5 mg total) by mouth  daily. Patient not taking: Reported on 04/11/2015 01/27/15   Alison Murray, MD   BP 94/53 mmHg  Pulse 62  Temp(Src)   Resp 16  SpO2 97% Physical Exam  Constitutional: She appears well-developed and well-nourished. No distress.  HENT:  Head: Normocephalic.  Mouth/Throat: Oropharynx is clear and moist.  Right forehead hematoma.  Eyes: EOM are normal. Pupils are equal, round, and reactive to light.  Neck:  Cervical collar in place.  Cardiovascular: Normal rate and regular rhythm.   Pulmonary/Chest: Effort normal and breath sounds normal. No respiratory distress. She has no wheezes. She has no rales. She exhibits no tenderness.  Abdominal: Soft. Bowel sounds are normal. She exhibits no distension and no mass. There is no tenderness. There is no rebound and no guarding.  Musculoskeletal: Normal range of motion. She exhibits no edema or tenderness.  Patient is uncooperative with the exam but appears to be have normal range of motion in all extremities. Pelvis is stable.  Neurological:  Patient mumbles incoherently. She does not follow commands. Visualized moving all extremities.  Skin: Skin is warm and dry. No rash noted. No erythema.  Psychiatric: She has a normal mood and affect. Her behavior is normal.  Nursing note and vitals reviewed.   ED Course  Procedures (including critical care time) Labs Review Labs Reviewed - No data to display  Imaging Review Ct Head Wo Contrast  04/11/2015  CLINICAL DATA:  Witnessed fall out of chair striking forehead on tile floor, history dementia, type II diabetes mellitus, chronic kidney disease EXAM: CT HEAD WITHOUT CONTRAST CT CERVICAL SPINE WITHOUT CONTRAST TECHNIQUE: Multidetector CT imaging of the head and cervical spine was performed following the standard protocol without intravenous contrast. Multiplanar CT image reconstructions of the cervical spine were also generated. COMPARISON:  03/25/2015 FINDINGS: CT HEAD FINDINGS Generalized atrophy.  Normal ventricular morphology. No midline shift or mass effect. Small vessel chronic ischemic changes of deep cerebral white matter. No intracranial hemorrhage, mass lesion, or acute infarction. Mucosal thickening and minimal fluid within the frontal sinus and a few anterior ethmoid air cells. Visualized paranasal sinuses and mastoid air cells otherwise clear. Bones unremarkable. Atherosclerotic calcifications at internal carotid arteries bilaterally at skullbase. Small RIGHT frontal scalp hematoma. CT CERVICAL SPINE FINDINGS Osseous demineralization. Prevertebral soft tissues normal thickness. Multilevel degenerative disc disease changes with disc space narrowing and endplate spur formation. Minimal scattered facet degenerative changes. Vertebral body heights maintained without fracture or subluxation. Visualized skullbase intact. Lung apices clear. Scattered atherosclerotic calcifications aorta and carotid bifurcations. IMPRESSION: Atrophy with small vessel chronic ischemic changes of deep cerebral white matter. No acute intracranial abnormalities. Degenerative disc disease changes cervical spine. No acute cervical spine abnormalities. Electronically Signed   By: Ulyses Southward M.D.   On: 04/11/2015 12:52   Ct Cervical Spine Wo Contrast  04/11/2015  CLINICAL DATA:  Witnessed fall out of chair striking forehead on tile floor, history dementia, type II diabetes mellitus, chronic kidney disease EXAM: CT HEAD  WITHOUT CONTRAST CT CERVICAL SPINE WITHOUT CONTRAST TECHNIQUE: Multidetector CT imaging of the head and cervical spine was performed following the standard protocol without intravenous contrast. Multiplanar CT image reconstructions of the cervical spine were also generated. COMPARISON:  03/25/2015 FINDINGS: CT HEAD FINDINGS Generalized atrophy. Normal ventricular morphology. No midline shift or mass effect. Small vessel chronic ischemic changes of deep cerebral white matter. No intracranial hemorrhage, mass  lesion, or acute infarction. Mucosal thickening and minimal fluid within the frontal sinus and a few anterior ethmoid air cells. Visualized paranasal sinuses and mastoid air cells otherwise clear. Bones unremarkable. Atherosclerotic calcifications at internal carotid arteries bilaterally at skullbase. Small RIGHT frontal scalp hematoma. CT CERVICAL SPINE FINDINGS Osseous demineralization. Prevertebral soft tissues normal thickness. Multilevel degenerative disc disease changes with disc space narrowing and endplate spur formation. Minimal scattered facet degenerative changes. Vertebral body heights maintained without fracture or subluxation. Visualized skullbase intact. Lung apices clear. Scattered atherosclerotic calcifications aorta and carotid bifurcations. IMPRESSION: Atrophy with small vessel chronic ischemic changes of deep cerebral white matter. No acute intracranial abnormalities. Degenerative disc disease changes cervical spine. No acute cervical spine abnormalities. Electronically Signed   By: Ulyses Southward M.D.   On: 04/11/2015 12:52   I have personally reviewed and evaluated these images and lab results as part of my medical decision-making.   EKG Interpretation None      MDM   Final diagnoses:  Fall, initial encounter  Scalp hematoma, initial encounter    No intracranial injury on CT. Will discharge back to nursing home.    Loren Racer, MD 04/11/15 1309

## 2015-04-11 NOTE — Discharge Instructions (Signed)
Head Injury, Adult °You have a head injury. Headaches and throwing up (vomiting) are common after a head injury. It should be easy to wake up from sleeping. Sometimes you must stay in the hospital. Most problems happen within the first 24 hours. Side effects may occur up to 7-10 days after the injury.  °WHAT ARE THE TYPES OF HEAD INJURIES? °Head injuries can be as minor as a bump. Some head injuries can be more severe. More severe head injuries include: °· A jarring injury to the brain (concussion). °· A bruise of the brain (contusion). This mean there is bleeding in the brain that can cause swelling. °· A cracked skull (skull fracture). °· Bleeding in the brain that collects, clots, and forms a bump (hematoma). °WHEN SHOULD I GET HELP RIGHT AWAY?  °· You are confused or sleepy. °· You cannot be woken up. °· You feel sick to your stomach (nauseous) or keep throwing up (vomiting). °· Your dizziness or unsteadiness is getting worse. °· You have very bad, lasting headaches that are not helped by medicine. Take medicines only as told by your doctor. °· You cannot use your arms or legs like normal. °· You cannot walk. °· You notice changes in the black spots in the center of the colored part of your eye (pupil). °· You have clear or bloody fluid coming from your nose or ears. °· You have trouble seeing. °During the next 24 hours after the injury, you must stay with someone who can watch you. This person should get help right away (call 911 in the [place] if you start to shake and are not able to control it (have seizures), you pass out, or you are unable to wake up. °HOW CAN I PREVENT A HEAD INJURY IN THE FUTURE? °· Wear seat belts. °· Wear a helmet while bike riding and playing sports like football. °· Stay away from dangerous activities around the house. °WHEN CAN I RETURN TO NORMAL ACTIVITIES AND ATHLETICS? °See your doctor before doing these activities. You should not do normal activities or play contact sports until 1  week after the following symptoms have stopped: °· Headache that does not go away. °· Dizziness. °· Poor attention. °· Confusion. °· Memory problems. °· Sickness to your stomach or throwing up. °· Tiredness. °· Fussiness. °· Bothered by bright lights or loud noises. °· Anxiousness or depression. °· Restless sleep. °MAKE SURE YOU:  °· Understand these instructions. °· Will watch your condition. °· Will get help right away if you are not doing well or get worse. °  °This information is not intended to replace advice given to you by your health care provider. Make sure you discuss any questions you have with your health care provider. °  °Document Released: 02/12/2008 Document Revised: 03/22/2014 Document Reviewed: 11/06/2012 °Elsevier Interactive Patient Education ©2016 Else[employer] Inc[employer] ° °

## 2015-04-13 ENCOUNTER — Emergency Department (HOSPITAL_COMMUNITY): Payer: Medicare Other

## 2015-04-13 ENCOUNTER — Emergency Department (HOSPITAL_COMMUNITY)
Admission: EM | Admit: 2015-04-13 | Discharge: 2015-04-13 | Disposition: A | Discharge status: Home / Self Care | Payer: Medicare Other | Attending: Emergency Medicine | Admitting: Emergency Medicine

## 2015-04-13 ENCOUNTER — Encounter (HOSPITAL_COMMUNITY): Payer: Self-pay | Admitting: Nurse Practitioner

## 2015-04-13 DIAGNOSIS — Z8719 Personal history of other diseases of the digestive system: Secondary | ICD-10-CM | POA: Insufficient documentation

## 2015-04-13 DIAGNOSIS — Z7982 Long term (current) use of aspirin: Secondary | ICD-10-CM | POA: Insufficient documentation

## 2015-04-13 DIAGNOSIS — F419 Anxiety disorder, unspecified: Secondary | ICD-10-CM | POA: Insufficient documentation

## 2015-04-13 DIAGNOSIS — Y9389 Activity, other specified: Secondary | ICD-10-CM | POA: Diagnosis not present

## 2015-04-13 DIAGNOSIS — S0093XA Contusion of unspecified part of head, initial encounter: Secondary | ICD-10-CM

## 2015-04-13 DIAGNOSIS — Y998 Other external cause status: Secondary | ICD-10-CM | POA: Insufficient documentation

## 2015-04-13 DIAGNOSIS — F0390 Unspecified dementia without behavioral disturbance: Secondary | ICD-10-CM | POA: Insufficient documentation

## 2015-04-13 DIAGNOSIS — N189 Chronic kidney disease, unspecified: Secondary | ICD-10-CM | POA: Insufficient documentation

## 2015-04-13 DIAGNOSIS — E119 Type 2 diabetes mellitus without complications: Secondary | ICD-10-CM | POA: Diagnosis not present

## 2015-04-13 DIAGNOSIS — W19XXXA Unspecified fall, initial encounter: Secondary | ICD-10-CM

## 2015-04-13 DIAGNOSIS — W01198A Fall on same level from slipping, tripping and stumbling with subsequent striking against other object, initial encounter: Secondary | ICD-10-CM | POA: Diagnosis not present

## 2015-04-13 DIAGNOSIS — Z79899 Other long term (current) drug therapy: Secondary | ICD-10-CM | POA: Insufficient documentation

## 2015-04-13 DIAGNOSIS — Y92129 Unspecified place in nursing home as the place of occurrence of the external cause: Secondary | ICD-10-CM | POA: Diagnosis not present

## 2015-04-13 DIAGNOSIS — S0990XA Unspecified injury of head, initial encounter: Secondary | ICD-10-CM | POA: Diagnosis present

## 2015-04-13 DIAGNOSIS — S0083XA Contusion of other part of head, initial encounter: Secondary | ICD-10-CM | POA: Insufficient documentation

## 2015-04-13 LAB — CBC WITH DIFFERENTIAL/PLATELET
Basophils Absolute: 0 10*3/uL (ref 0.0–0.1)
Basophils Relative: 0 %
Eosinophils Absolute: 0.2 10*3/uL (ref 0.0–0.7)
Eosinophils Relative: 5 %
HCT: 39.3 % (ref 36.0–46.0)
Hemoglobin: 13.1 g/dL (ref 12.0–15.0)
Lymphocytes Relative: 50 %
Lymphs Abs: 2.3 10*3/uL (ref 0.7–4.0)
MCH: 31.1 pg (ref 26.0–34.0)
MCHC: 33.3 g/dL (ref 30.0–36.0)
MCV: 93.3 fL (ref 78.0–100.0)
Monocytes Absolute: 0.5 10*3/uL (ref 0.1–1.0)
Monocytes Relative: 11 %
Neutro Abs: 1.6 10*3/uL — ABNORMAL LOW (ref 1.7–7.7)
Neutrophils Relative %: 34 %
Platelets: 197 10*3/uL (ref 150–400)
RBC: 4.21 MIL/uL (ref 3.87–5.11)
RDW: 14.1 % (ref 11.5–15.5)
WBC: 4.7 10*3/uL (ref 4.0–10.5)

## 2015-04-13 LAB — COMPREHENSIVE METABOLIC PANEL
ALBUMIN: 3.6 g/dL (ref 3.5–5.0)
ALK PHOS: 75 U/L (ref 38–126)
ALT: 33 U/L (ref 14–54)
AST: 27 U/L (ref 15–41)
Anion gap: 8 (ref 5–15)
BILIRUBIN TOTAL: 0.7 mg/dL (ref 0.3–1.2)
BUN: 27 mg/dL — AB (ref 6–20)
CALCIUM: 9.8 mg/dL (ref 8.9–10.3)
CO2: 29 mmol/L (ref 22–32)
Chloride: 106 mmol/L (ref 101–111)
Creatinine, Ser: 0.91 mg/dL (ref 0.44–1.00)
GFR calc Af Amer: >60 mL/min (ref >60)
GFR calc non Af Amer: 54 mL/min — ABNORMAL LOW (ref >60)
GLUCOSE: 72 mg/dL (ref 65–99)
Potassium: 4.2 mmol/L (ref 3.5–5.1)
SODIUM: 143 mmol/L (ref 135–145)
TOTAL PROTEIN: 7 g/dL (ref 6.5–8.1)

## 2015-04-13 LAB — URINALYSIS, ROUTINE W REFLEX MICROSCOPIC
Bilirubin Urine: NEGATIVE
Glucose, UA: NEGATIVE mg/dL
Hgb urine dipstick: NEGATIVE
Ketones, ur: NEGATIVE mg/dL
Leukocytes, UA: NEGATIVE
Nitrite: NEGATIVE
Protein, ur: NEGATIVE mg/dL
Specific Gravity, Urine: 1.02 (ref 1.005–1.030)
pH: 6.5 (ref 5.0–8.0)

## 2015-04-13 NOTE — Discharge Instructions (Signed)
Pamela Ramirez had a CT scan of her head and neck as well as a chest x ray today.  Please have her follow up with her family doctor in the next week for recheck.    Facial or Scalp Contusion A facial or scalp contusion is a deep bruise on the face or head. Injuries to the face and head generally cause a lot of swelling, especially around the eyes. Contusions are the result of an injury that caused bleeding under the skin. The contusion may turn blue, purple, or yellow. Minor injuries will give you a painless contusion, but more severe contusions may stay painful and swollen for a few weeks.  CAUSES  A facial or scalp contusion is caused by a blunt injury or trauma to the face or head area.  SIGNS AND SYMPTOMS   Swelling of the injured area.   Discoloration of the injured area.   Tenderness, soreness, or pain in the injured area.  DIAGNOSIS  The diagnosis can be made by taking a medical history and doing a physical exam. An X-ray exam, CT scan, or MRI may be needed to determine if there are any associated injuries, such as broken bones (fractures). TREATMENT  Often, the best treatment for a facial or scalp contusion is applying cold compresses to the injured area. Over-the-counter medicines may also be recommended for pain control.  HOME CARE INSTRUCTIONS   Only take over-the-counter or prescription medicines as directed by your health care provider.   Apply ice to the injured area.   Put ice in a plastic bag.   Place a towel between your skin and the bag.   Leave the ice on for 20 minutes, 2-3 times a day.  SEEK MEDICAL CARE IF:  You have bite problems.   You have pain with chewing.   You are concerned about facial defects. SEEK IMMEDIATE MEDICAL CARE IF:  You have severe pain or a headache that is not relieved by medicine.   You have unusual sleepiness, confusion, or personality changes.   You throw up (vomit).   You have a persistent nosebleed.   You have  double vision or blurred vision.   You have fluid drainage from your nose or ear.   You have difficulty walking or using your arms or legs.  MAKE SURE YOU:   Understand these instructions.  Will watch your condition.  Will get help right away if you are not doing well or get worse.   This information is not intended to replace advice given to you by your health care provider. Make sure you discuss any questions you have with your health care provider.   Document Released: 04/08/2004 Document Revised: 03/22/2014 Document Reviewed: 10/12/2012 Elsevier Interactive Patient Education 2016 [employer]. Fall Prevention in the Home  Falls can cause injuries and can affect people from all age groups. There are many simple things that you can do to make your home safe and to help prevent falls. WHAT CAN I DO ON THE OUTSIDE OF MY HOME?  Regularly repair the edges of walkways and driveways and fix any cracks.  Remove high doorway thresholds.  Trim any shrubbery on the main path into your home.  Use bright outdoor lighting.  Clear walkways of debris and clutter, including tools and rocks.  Regularly check that handrails are securely fastened and in good repair. Both sides of any steps should have handrails.  Install guardrails along the edges of any raised decks or porches.  Have leaves, snow, and  ice cleared regularly.  Use sand or salt on walkways during winter months.  In the garage, clean up any spills right away, including grease or oil spills. WHAT CAN I DO IN THE BATHROOM?  Use night lights.  Install grab bars by the toilet and in the tub and shower. Do not use towel bars as grab bars.  Use non-skid mats or decals on the floor of the tub or shower.  If you need to sit down while you are in the shower, use a plastic, non-slip stool.Marland Kitchen  Keep the floor dry. Immediately clean up any water that spills on the floor.  Remove soap buildup in the tub or shower on a regular  basis.  Attach bath mats securely with double-sided non-slip rug tape.  Remove throw rugs and other tripping hazards from the floor. WHAT CAN I DO IN THE BEDROOM?  Use night lights.  Make sure that a bedside light is easy to reach.  Do not use oversized bedding that drapes onto the floor.  Have a firm chair that has side arms to use for getting dressed.  Remove throw rugs and other tripping hazards from the floor. WHAT CAN I DO IN THE KITCHEN?   Clean up any spills right away.  Avoid walking on wet floors.  Place frequently used items in easy-to-reach places.  If you need to reach for something above you, use a sturdy step stool that has a grab bar.  Keep electrical cables out of the way.  Do not use floor polish or wax that makes floors slippery. If you have to use wax, make sure that it is non-skid floor wax.  Remove throw rugs and other tripping hazards from the floor. WHAT CAN I DO IN THE STAIRWAYS?  Do not leave any items on the stairs.  Make sure that there are handrails on both sides of the stairs. Fix handrails that are broken or loose. Make sure that handrails are as long as the stairways.  Check any carpeting to make sure that it is firmly attached to the stairs. Fix any carpet that is loose or worn.  Avoid having throw rugs at the top or bottom of stairways, or secure the rugs with carpet tape to prevent them from moving.  Make sure that you have a light switch at the top of the stairs and the bottom of the stairs. If you do not have them, have them installed. WHAT ARE SOME OTHER FALL PREVENTION TIPS?  Wear closed-toe shoes that fit well and support your feet. Wear shoes that have rubber soles or low heels.  When you use a stepladder, make sure that it is completely opened and that the sides are firmly locked. Have someone hold the ladder while you are using it. Do not climb a closed stepladder.  Add color or contrast paint or tape to grab bars and handrails  in your home. Place contrasting color strips on the first and last steps.  Use mobility aids as needed, such as canes, walkers, scooters, and crutches.  Turn on lights if it is dark. Replace any light bulbs that burn out.  Set up furniture so that there are clear paths. Keep the furniture in the same spot.  Fix any uneven floor surfaces.  Choose a carpet design that does not hide the edge of steps of a stairway.  Be aware of any and all pets.  Review your medicines with your healthcare provider. Some medicines can cause dizziness or changes in blood pressure,  which increase your risk of falling. Talk with your health care provider about other ways that you can decrease your risk of falls. This may include working with a physical therapist or trainer to improve your strength, balance, and endurance.   This information is not intended to replace advice given to you by your health care provider. Make sure you discuss any questions you have with your health care provider.   Document Released: 02/19/2002 Document Revised: 07/16/2014 Document Reviewed: 04/05/2014 Elsevier Interactive Patient Education [employer]2016 Elsevier Inc.

## 2015-04-13 NOTE — ED Notes (Signed)
Patient resides at [facility]. Suffered a fall this morning and struck her head, no LOC, hit her head on the railing and was observed to have a hematoma to her head. She takes aspirin but no other blood thinners. No changes in baseline mentation at this time. Patient has hx of dementia and kidney disease.

## 2015-04-13 NOTE — ED Notes (Signed)
Bed: JX91 Expected date: 04/13/15 Expected time: 7:47 AM Means of arrival: Ambulance Comments: Fall, hematoma to head

## 2015-04-13 NOTE — ED Provider Notes (Signed)
CSN: [id number]     Arrival date & time 04/13/15  0757 History   First MD Initiated Contact with Patient 04/13/15 0809     Chief Complaint  Patient presents with  . Fall    hematoma on head     Patient is a 82 y.o. female presenting with fall. The history is provided by the EMS personnel and the nursing home. No language interpreter was used.  Fall   Pamela Ramirez is a 82 y.o. female who presents to the Emergency Department complaining of fall. Level 5 caveat due to dementia. History is provided by EMS and nursing home. Per report she fell this morning and struck her head on a railing. And she was noted to have a hematoma. She takes a baby aspirin daily. She has advanced dementia and is minimally verbal at baseline. She was walking down the hall, lost her balance and fell.  She did not try to get up, did not lose consciousness.  She is normally ambulatory without difficulty but minimally communicative and agitated at times.  No recent illness.  No fevers, cough, abdominal pain, vomiting.  She is eating well and can feed herself.    Past Medical History  Diagnosis Date  . Dementia   . Chronic kidney disease   . Osteoporosis   . GERD (gastroesophageal reflux disease)   . Anxiety   . Diabetes type 2, controlled (HCC)    History reviewed. No pertinent past surgical history. History reviewed. No pertinent family history. Social History  Substance Use Topics  . Smoking status: Never Smoker   . Smokeless tobacco: None  . Alcohol Use: No   OB History    No data available     Review of Systems  Unable to perform ROS: Patient nonverbal      Allergies  Review of patient's allergies indicates no known allergies.  Home Medications   Prior to Admission medications   Medication Sig Start Date End Date Taking? Authorizing Provider  acetaminophen (TYLENOL) 500 MG tablet Take 500 mg by mouth every 4 (four) hours as needed for mild pain, moderate pain, fever or headache.   Yes  Historical Provider, MD  acetaminophen (TYLENOL) 650 MG CR tablet Take 650 mg by mouth 3 (three) times daily.   Yes Historical Provider, MD  ALPRAZolam (XANAX) 0.25 MG tablet Take 1 tablet (0.25 mg total) by mouth 2 (two) times daily. 01/27/15  Yes Alison Murray, MD  alum & mag hydroxide-simeth (MAALOX/MYLANTA) 200-200-20 MG/5ML suspension Take 30 mLs by mouth every 6 (six) hours as needed for indigestion or heartburn.   Yes Historical Provider, MD  amLODipine (NORVASC) 5 MG tablet Take 1 tablet (5 mg total) by mouth daily. 01/27/15  Yes Alison Murray, MD  aspirin 81 MG chewable tablet Chew 81 mg by mouth daily.   Yes Historical Provider, MD  atorvastatin (LIPITOR) 20 MG tablet Take 20 mg by mouth at bedtime.    Yes Historical Provider, MD  divalproex (DEPAKOTE SPRINKLE) 125 MG capsule Take 250 mg by mouth 3 (three) times daily. 8AM ,2PM , 8PM   Yes Historical Provider, MD  guaifenesin (ROBITUSSIN) 100 MG/5ML syrup Take 200 mg by mouth every 6 (six) hours as needed for cough.   Yes Historical Provider, MD  haloperidol (HALDOL) 0.5 MG tablet Take 1 tablet (0.5 mg total) by mouth at bedtime. 01/27/15  Yes Alison Murray, MD  hydrocerin (EUCERIN) CREA Apply 1 application topically 2 (two) times daily. APPLY TO ENTIRE BODY  Yes Historical Provider, MD  loperamide (IMODIUM) 2 MG capsule Take 2 mg by mouth as needed for diarrhea or loose stools (take 1 capsule as needed with each loose stool for diarrhea (not to exceed 8 doses in 24 hours)).    Yes Historical Provider, MD  magnesium hydroxide (MILK OF MAGNESIA) 400 MG/5ML suspension Take 30 mLs by mouth at bedtime as needed for mild constipation or moderate constipation.   Yes Historical Provider, MD  Multiple Vitamins-Minerals (MULTIVITAMINS THER. W/MINERALS) TABS Take 1 tablet by mouth daily.   Yes Historical Provider, MD  neomycin-bacitracin-polymyxin (NEOSPORIN) [phone] ointment Apply 1 application topically as needed (apply to minor skin tears or  abrasions, cover with band aid/gauze and tape. change as needed until healed).   Yes Historical Provider, MD  Nutritional Supplements (NUTRITIONAL DRINK) LIQD Take 1 Bottle by mouth 3 (three) times daily.    Yes Historical Provider, MD  rivastigmine (EXELON) 3 MG capsule Take 3 mg by mouth 2 (two) times daily.   Yes Historical Provider, MD  Vitamins-Lipotropics (LIPO-FLAVONOID PLUS PO) Take 1 tablet by mouth daily.   Yes Historical Provider, MD   BP 130/90 mmHg  Pulse 76  Temp(Src) 97.8 F (36.6 C) (Oral)  Resp 18  SpO2 96% Physical Exam  Constitutional: She appears well-developed and well-nourished. No distress.  HENT:  Head: Normocephalic.  Hematoma to left forehead  Eyes: Pupils are equal, round, and reactive to light.  Cardiovascular: Normal rate and regular rhythm.   Pulmonary/Chest: Effort normal and breath sounds normal. No respiratory distress. She exhibits no tenderness.  Abdominal: Soft. There is no tenderness. There is no rebound and no guarding.  Neurological: She is alert.  Eyes open spontaneously.  Nonverbal.  Resists examination  Skin: Skin is warm.  Psychiatric:  Unable to assess  Nursing note and vitals reviewed.   ED Course  Procedures (including critical care time) Labs Review Labs Reviewed  COMPREHENSIVE METABOLIC PANEL - Abnormal; Notable for the following:    BUN 27 (*)    GFR calc non Af Amer 54 (*)    All other components within normal limits  CBC WITH DIFFERENTIAL/PLATELET - Abnormal; Notable for the following:    Neutro Abs 1.6 (*)    All other components within normal limits  URINALYSIS, ROUTINE W REFLEX MICROSCOPIC (NOT AT [facility])    Imaging Review Dg Chest 2 View  04/13/2015  CLINICAL DATA:  Fall.  Dementia EXAM: CHEST  2 VIEW COMPARISON:  01/25/2015 FINDINGS: Cardiomegaly with mild vascular congestion. No confluent opacities or effusions. No acute bony abnormality. Degenerative changes in the shoulders. IMPRESSION: Cardiomegaly, vascular  congestion. Electronically Signed   By: Charlett Nose M.D.   On: 04/13/2015 09:29   Ct Head Wo Contrast  04/13/2015  CLINICAL DATA:  Recent fall EXAM: CT HEAD WITHOUT CONTRAST CT CERVICAL SPINE WITHOUT CONTRAST TECHNIQUE: Multidetector CT imaging of the head and cervical spine was performed following the standard protocol without intravenous contrast. Multiplanar CT image reconstructions of the cervical spine were also generated. COMPARISON:  04/11/2015 FINDINGS: CT HEAD FINDINGS Bony calvarium is intact. A left frontal scalp hematoma is noted related to the recent injury. Diffuse atrophic changes and chronic white matter ischemic changes are seen. No findings to suggest acute hemorrhage, acute infarction or space-occupying mass lesion are noted. CT CERVICAL SPINE FINDINGS Seven cervical segments are well visualized. Vertebral body height is well maintained. Osteophytic changes are noted at multiple levels as well as disc space narrowing worst at C3-4. No acute fracture or  acute facet abnormality is noted. No acute soft tissue abnormality is noted. Diffuse carotid calcifications are seen. IMPRESSION: CT of the head:  No acute intracranial abnormality noted. Left frontal scalp hematoma new from the prior exam. CT of the cervical spine: Degenerative changes without acute abnormality. Electronically Signed   By: Alcide Clever M.D.   On: 04/13/2015 09:31   Ct Cervical Spine Wo Contrast  04/13/2015  CLINICAL DATA:  Recent fall EXAM: CT HEAD WITHOUT CONTRAST CT CERVICAL SPINE WITHOUT CONTRAST TECHNIQUE: Multidetector CT imaging of the head and cervical spine was performed following the standard protocol without intravenous contrast. Multiplanar CT image reconstructions of the cervical spine were also generated. COMPARISON:  04/11/2015 FINDINGS: CT HEAD FINDINGS Bony calvarium is intact. A left frontal scalp hematoma is noted related to the recent injury. Diffuse atrophic changes and chronic white matter ischemic  changes are seen. No findings to suggest acute hemorrhage, acute infarction or space-occupying mass lesion are noted. CT CERVICAL SPINE FINDINGS Seven cervical segments are well visualized. Vertebral body height is well maintained. Osteophytic changes are noted at multiple levels as well as disc space narrowing worst at C3-4. No acute fracture or acute facet abnormality is noted. No acute soft tissue abnormality is noted. Diffuse carotid calcifications are seen. IMPRESSION: CT of the head:  No acute intracranial abnormality noted. Left frontal scalp hematoma new from the prior exam. CT of the cervical spine: Degenerative changes without acute abnormality. Electronically Signed   By: Alcide Clever M.D.   On: 04/13/2015 09:31   I have personally reviewed and evaluated these images and lab results as part of my medical decision-making.   EKG Interpretation None      MDM   Final diagnoses:  Fall, initial encounter  Head contusion, initial encounter    Patient here from nursing home for evaluation of injuries following a fall. History and examination is limited due to dementia. She does not appear to have any tenderness on examination and only physical exam findings are significant for her left frontal head contusion. The patient is able to stand and does not appear to have any lower extremity or back tenderness with standing. She does not appear to understand walking" we attempted to walk her. Attempted to contact family for additional history but there was no answer on the provided phone number. Labs are stable compared to priors. Plan to transfer back to facility.  Tilden Fossa, MD 04/13/15 1616

## 2015-04-13 NOTE — ED Notes (Addendum)
Attempted to ambulate patient.  Patient walked two steps with very unsteady, shuffling gait.  Patient placed back in bed and warm blankets placed. No signs of pain when standing.  Patient able to bear weight without difficulty.

## 2015-05-06 ENCOUNTER — Emergency Department (HOSPITAL_COMMUNITY)
Admission: EM | Admit: 2015-05-06 | Discharge: 2015-05-06 | Disposition: A | Discharge status: Home / Self Care | Payer: Medicare Other | Attending: Emergency Medicine | Admitting: Emergency Medicine

## 2015-05-06 ENCOUNTER — Emergency Department (HOSPITAL_COMMUNITY): Payer: Medicare Other

## 2015-05-06 DIAGNOSIS — Y92129 Unspecified place in nursing home as the place of occurrence of the external cause: Secondary | ICD-10-CM | POA: Insufficient documentation

## 2015-05-06 DIAGNOSIS — Z79899 Other long term (current) drug therapy: Secondary | ICD-10-CM | POA: Insufficient documentation

## 2015-05-06 DIAGNOSIS — Z043 Encounter for examination and observation following other accident: Secondary | ICD-10-CM | POA: Diagnosis present

## 2015-05-06 DIAGNOSIS — N189 Chronic kidney disease, unspecified: Secondary | ICD-10-CM | POA: Diagnosis not present

## 2015-05-06 DIAGNOSIS — Z8659 Personal history of other mental and behavioral disorders: Secondary | ICD-10-CM | POA: Insufficient documentation

## 2015-05-06 DIAGNOSIS — Y9389 Activity, other specified: Secondary | ICD-10-CM | POA: Insufficient documentation

## 2015-05-06 DIAGNOSIS — W01198A Fall on same level from slipping, tripping and stumbling with subsequent striking against other object, initial encounter: Secondary | ICD-10-CM | POA: Diagnosis not present

## 2015-05-06 DIAGNOSIS — Z8719 Personal history of other diseases of the digestive system: Secondary | ICD-10-CM | POA: Insufficient documentation

## 2015-05-06 DIAGNOSIS — Z8739 Personal history of other diseases of the musculoskeletal system and connective tissue: Secondary | ICD-10-CM | POA: Insufficient documentation

## 2015-05-06 DIAGNOSIS — Z7982 Long term (current) use of aspirin: Secondary | ICD-10-CM | POA: Insufficient documentation

## 2015-05-06 DIAGNOSIS — F0390 Unspecified dementia without behavioral disturbance: Secondary | ICD-10-CM | POA: Insufficient documentation

## 2015-05-06 DIAGNOSIS — Y9289 Other specified places as the place of occurrence of the external cause: Secondary | ICD-10-CM | POA: Diagnosis not present

## 2015-05-06 DIAGNOSIS — E119 Type 2 diabetes mellitus without complications: Secondary | ICD-10-CM | POA: Insufficient documentation

## 2015-05-06 DIAGNOSIS — W19XXXA Unspecified fall, initial encounter: Secondary | ICD-10-CM

## 2015-05-06 LAB — URINALYSIS, ROUTINE W REFLEX MICROSCOPIC
BILIRUBIN URINE: NEGATIVE
GLUCOSE, UA: NEGATIVE mg/dL
Hgb urine dipstick: NEGATIVE
KETONES UR: NEGATIVE mg/dL
LEUKOCYTES UA: NEGATIVE
Nitrite: NEGATIVE
PH: 7.5 (ref 5.0–8.0)
PROTEIN: NEGATIVE mg/dL
Specific Gravity, Urine: 1.013 (ref 1.005–1.030)

## 2015-05-06 LAB — I-STAT TROPONIN, ED: TROPONIN I, POC: 0 ng/mL (ref 0.00–0.08)

## 2015-05-06 LAB — CBC
HCT: 37 % (ref 36.0–46.0)
HEMOGLOBIN: 12.2 g/dL (ref 12.0–15.0)
MCH: 31.4 pg (ref 26.0–34.0)
MCHC: 33 g/dL (ref 30.0–36.0)
MCV: 95.4 fL (ref 78.0–100.0)
PLATELETS: 192 10*3/uL (ref 150–400)
RBC: 3.88 MIL/uL (ref 3.87–5.11)
RDW: 14.2 % (ref 11.5–15.5)
WBC: 5.2 10*3/uL (ref 4.0–10.5)

## 2015-05-06 LAB — BASIC METABOLIC PANEL
Anion gap: 5 (ref 5–15)
BUN: 22 mg/dL — ABNORMAL HIGH (ref 6–20)
CHLORIDE: 108 mmol/L (ref 101–111)
CO2: 31 mmol/L (ref 22–32)
CREATININE: 0.89 mg/dL (ref 0.44–1.00)
Calcium: 9.9 mg/dL (ref 8.9–10.3)
GFR, EST NON AFRICAN AMERICAN: 55 mL/min — AB (ref >60)
Glucose, Bld: 72 mg/dL (ref 65–99)
POTASSIUM: 4.8 mmol/L (ref 3.5–5.1)
SODIUM: 144 mmol/L (ref 135–145)

## 2015-05-06 NOTE — Progress Notes (Signed)
CSW attempted to speak with patient at bedside. Patient had the cover over her head and she did not respond when CSW called her name several times. Per nurse note, patient has history of dementia.  Pamela Ramirez, [place] [phone] ED CSW

## 2015-05-06 NOTE — Discharge Instructions (Signed)
Please read and follow all provided instructions.  Your diagnoses today include:  1. Fall, initial encounter     Tests performed today include:  Vital signs. See below for your results today.   Medications prescribed:   None   Home care instructions:  Follow any educational materials contained in this packet.  Follow-up instructions: Please follow-up with your primary care provider if symptoms worsen  Return instructions:   Please return to the Emergency Department if you do not get better, if you get worse, or new symptoms OR  - Fever (temperature greater than 101.50F)  - Bleeding that does not stop with holding pressure to the area    -Severe pain (please note that you may be more sore the day after your accident)  - Chest Pain  - Difficulty breathing  - Severe nausea or vomiting  - Inability to tolerate food and liquids  - Passing out  - Skin becoming red around your wounds  - Change in mental status (confusion or lethargy)  - New numbness or weakness     Please return if you have any other emergent concerns.  Additional Information:  Your vital signs today were: BP 134/65 mmHg   Pulse 61   Temp(Src) 97.3 F (36.3 C) (Axillary)   Resp 16   SpO2 95% If your blood pressure (BP) was elevated above 135/85 this visit, please have this repeated by your doctor within one month. ---------------

## 2015-05-06 NOTE — ED Notes (Addendum)
Pt is from [facility].  Was walking with a staff member, stumbled back and slid down a wall.  No signs of pain or injury.  Pt has dementia and has been sleeping on the way here. VSS: CBG 98, 130/86, 66, 16.  Per report, pt is normally non-verbal d/t dementia

## 2015-05-06 NOTE — ED Provider Notes (Signed)
Patient presents to the emergency room for evaluation after a fall at her nursing facility. History is limited because of the patient's dementia. According to the EMS reports patient was walking with staff member when she stumbled back and slid down the wall. Physical Exam  BP 134/65 mmHg  Pulse 61  Temp(Src) 97.3 F (36.3 C) (Axillary)  Resp 16  SpO2 95%  Physical Exam  Constitutional: She appears well-developed and well-nourished. No distress.  HENT:  Head: Normocephalic.  Right Ear: External ear normal.  Left Ear: External ear normal.  Bruising noted on the left forehead and around the left eye  Eyes: Conjunctivae are normal. Right eye exhibits no discharge. Left eye exhibits no discharge. No scleral icterus.  Neck: Neck supple. No tracheal deviation present.  Cardiovascular: Normal rate.   Pulmonary/Chest: Effort normal. No stridor. No respiratory distress.  Musculoskeletal: She exhibits no edema.  Neurological: She is alert. Cranial nerve deficit: no gross deficits.  Patient does not follow commands  Skin: Skin is warm and dry. No rash noted.  Psychiatric: She has a normal mood and affect.  Nursing note and vitals reviewed.   ED Course  Procedures  MDM Labs and imaging tests unremarkable.  Vital signs stable.  No acute injuries noted.        Linwood Dibbles, MD 05/06/15 [phone]

## 2015-05-06 NOTE — ED Provider Notes (Signed)
CSN: [id number]     Arrival date & time 05/06/15  0714 History   First MD Initiated Contact with Patient 05/06/15 [phone]     Chief Complaint  Patient presents with  . Fall   (Consider location/radiation/quality/duration/timing/severity/associated sxs/prior Treatment) HPI 82 y.o. female with a hx of dementia, presents to the Emergency Department today via EMS after a fall. Pt is resident of [address]. Upon speaking with nursing facility, she was found slumped against the wall. States that it was an unwitnessed fall. Unsure of duration. No signs of pain or injury.   Level V Caveat   Past Medical History  Diagnosis Date  . Dementia   . Chronic kidney disease   . Osteoporosis   . GERD (gastroesophageal reflux disease)   . Anxiety   . Diabetes type 2, controlled (HCC)    No past surgical history on file. No family history on file. Social History  Substance Use Topics  . Smoking status: Never Smoker   . Smokeless tobacco: Not on file  . Alcohol Use: No   OB History    No data available     Review of Systems  Unable to perform ROS: Dementia  Level V Caveat   Allergies  Review of patient's allergies indicates no known allergies.  Home Medications   Prior to Admission medications   Medication Sig Start Date End Date Taking? Authorizing Provider  acetaminophen (TYLENOL) 500 MG tablet Take 500 mg by mouth every 4 (four) hours as needed for mild pain, moderate pain, fever or headache.   Yes Historical Provider, MD  acetaminophen (TYLENOL) 650 MG CR tablet Take 650 mg by mouth 3 (three) times daily.   Yes Historical Provider, MD  ALPRAZolam (XANAX) 0.25 MG tablet Take 1 tablet (0.25 mg total) by mouth 2 (two) times daily. 01/27/15  Yes Alison Murray, MD  alum & mag hydroxide-simeth (MAALOX/MYLANTA) 200-200-20 MG/5ML suspension Take 30 mLs by mouth every 6 (six) hours as needed for indigestion or heartburn.   Yes Historical Provider, MD  amLODipine (NORVASC) 5 MG tablet Take 1  tablet (5 mg total) by mouth daily. 01/27/15  Yes Alison Murray, MD  aspirin 81 MG chewable tablet Chew 81 mg by mouth daily.   Yes Historical Provider, MD  atorvastatin (LIPITOR) 20 MG tablet Take 20 mg by mouth at bedtime.    Yes Historical Provider, MD  divalproex (DEPAKOTE SPRINKLE) 125 MG capsule Take 250 mg by mouth 3 (three) times daily. 8AM ,2PM , 8PM   Yes Historical Provider, MD  guaifenesin (ROBITUSSIN) 100 MG/5ML syrup Take 200 mg by mouth every 6 (six) hours as needed for cough.   Yes Historical Provider, MD  haloperidol (HALDOL) 0.5 MG tablet Take 1 tablet (0.5 mg total) by mouth at bedtime. 01/27/15  Yes Alison Murray, MD  hydrocerin (EUCERIN) CREA Apply 1 application topically 2 (two) times daily. APPLY TO ENTIRE BODY   Yes Historical Provider, MD  loperamide (IMODIUM) 2 MG capsule Take 2 mg by mouth as needed for diarrhea or loose stools ((not to exceed 8 doses in 24 hours)).    Yes Historical Provider, MD  magnesium hydroxide (MILK OF MAGNESIA) 400 MG/5ML suspension Take 30 mLs by mouth at bedtime as needed for mild constipation or moderate constipation.   Yes Historical Provider, MD  Multiple Vitamin (DAILY VITE) TABS Take 1 tablet by mouth daily with breakfast.   Yes Historical Provider, MD  neomycin-bacitracin-polymyxin (NEOSPORIN) [phone] ointment Apply 1 application topically as needed (apply  to minor skin tears or abrasions, cover with band aid/gauze and tape. change as needed until healed).   Yes Historical Provider, MD  Nutritional Supplements (NUTRITIONAL DRINK) LIQD Take 1 Bottle by mouth 3 (three) times daily.    Yes Historical Provider, MD  rivastigmine (EXELON) 3 MG capsule Take 3 mg by mouth 2 (two) times daily.   Yes Historical Provider, MD  Vitamins-Lipotropics (LIPO-FLAVONOID PLUS PO) Take 1 tablet by mouth daily with breakfast.    Yes Historical Provider, MD   BP 134/65 mmHg  Pulse 61  Temp(Src) 97.3 F (36.3 C) (Axillary)  Resp 16  SpO2 95%   Physical  Exam  Constitutional: She is oriented to person, place, and time. She appears well-developed and well-nourished.  HENT:  Head: Normocephalic and atraumatic.  Right Ear: Tympanic membrane, external ear and ear canal normal. No hemotympanum.  Left Ear: Tympanic membrane, external ear and ear canal normal. No hemotympanum.  Nose: Nose normal.  Mouth/Throat: Uvula is midline, oropharynx is clear and moist and mucous membranes are normal.  Left side hematoma noted on L scalp. Most likely from previous fall on 04/13/15  Eyes: EOM are normal. Pupils are equal, round, and reactive to light.  Neck: Normal range of motion. Neck supple.  Cardiovascular: Normal rate, regular rhythm and normal heart sounds.   Pulmonary/Chest: Effort normal and breath sounds normal.  Abdominal: Soft. There is no tenderness.  Musculoskeletal: Normal range of motion.       Right shoulder: Normal.       Left shoulder: Normal.       Right hip: Normal.       Left hip: Normal.       Right knee: Normal.       Left knee: Normal.       Right ankle: Normal.       Left ankle: Normal.  Neurological: She is alert and oriented to person, place, and time.  Skin: Skin is warm and dry.  Psychiatric: She has a normal mood and affect. Her behavior is normal. Thought content normal.  Nursing note and vitals reviewed.  ED Course  Procedures (including critical care time) Labs Review Labs Reviewed  BASIC METABOLIC PANEL - Abnormal; Notable for the following:    BUN 22 (*)    GFR calc non Af Amer 55 (*)    All other components within normal limits  URINE CULTURE  CBC  URINALYSIS, ROUTINE W REFLEX MICROSCOPIC (NOT AT [facility])  I-STAT TROPOININ, ED   Imaging Review Ct Head Wo Contrast  05/06/2015  CLINICAL DATA:  Stumbled and slid down wall while walking with a staff member, history dementia EXAM: CT HEAD WITHOUT CONTRAST TECHNIQUE: Contiguous axial images were obtained from the base of the skull through the vertex without  intravenous contrast. COMPARISON:  04/13/2015 FINDINGS: Nonstandard positioning. Generalized atrophy. Normal ventricular morphology. No midline shift or mass effect. Small vessel chronic ischemic changes of deep cerebral white matter. No intracranial hemorrhage, mass lesion, or acute infarction. Visualized paranasal sinuses and mastoid air cells clear. Bones unremarkable. Small LEFT frontal scalp hematoma. Atherosclerotic calcifications at the carotid siphons bilaterally. IMPRESSION: Atrophy with small vessel chronic ischemic changes of deep cerebral white matter. Small LEFT frontal scalp hematoma. No acute intracranial abnormalities. Electronically Signed   By: Ulyses Southward M.D.   On: 05/06/2015 09:25   I have personally reviewed and evaluated these images and lab results as part of my medical decision-making.   EKG Interpretation   Date/Time:  Tuesday May 06 2015 10:53:11 EST Ventricular Rate:  60 PR Interval:  208 QRS Duration: 126 QT Interval:  445 QTC Calculation: 445 R Axis:   -47 Text Interpretation:  Sinus rhythm RBBB and LAFB Poor data quality No  significant change since last tracing Confirmed by KNAPP  MD-J, JON  ([id number]) on 05/06/2015 11:25:22 AM      MDM  I have reviewed relevant laboratory values.I have reviewed relevant imaging studies.I personally interpreted the relevant EKG.I have reviewed the relevant previous healthcare records.I have reviewed EMS Documentation.I obtained HPI from historian. Patient discussed with supervising physician  ED Course:  Assessment: 76y F with hx dementia presents after fall from nursing facility. History and examination is limited due to dementia. Per report, patient was found slumped against wall. Unwitnessed fall. No obvious injuries noted. Hematoma appears to be from previous fall on 04/13/15. On exam, pt in NAD. VSS. Afebrile. Lungs CTA. Heart RRR. Abdomen nontender/soft. ROM intact of extremities x4. Pulses equal x4 extremities. No  obvious deformities or injuries noted on exam. Labs unremarkable. Neg trop. No leukocytosis. UA unremarkable. CT Head shows no acute abnormalities. EKG unremarkable. Fall appears mechanical in nature. No acute abnormalities detected. Will DC back to facility for further mangement and care. Patient is in no acute distress. Vital Signs are stable. Patient is able to ambulate down hallway.     Disposition/Plan:  DC Home Additional Verbal discharge instructions given and discussed with patient.  Pt Instructed to f/u with PCP if symptoms develop  Return precautions given Pt acknowledges and agrees with plan  Supervising Physician Linwood Dibbles, MD   Final diagnoses:  Fall, initial encounter    Audry Pili, PA-C 05/06/15 1301  Linwood Dibbles, MD 05/06/15 [phone]

## 2015-05-06 NOTE — ED Notes (Signed)
Bed: WA06 Expected date:  Expected time:  Means of arrival:  Comments: EMS 82yo F fall this am / dementia

## 2015-05-06 NOTE — ED Notes (Signed)
Patient ambulated 20 feet with some assistance.

## 2015-05-08 ENCOUNTER — Emergency Department (HOSPITAL_COMMUNITY)
Admission: EM | Admit: 2015-05-08 | Discharge: 2015-05-08 | Disposition: A | Discharge status: Home / Self Care | Payer: Medicare Other | Attending: Emergency Medicine | Admitting: Emergency Medicine

## 2015-05-08 ENCOUNTER — Encounter (HOSPITAL_COMMUNITY): Payer: Self-pay | Admitting: Emergency Medicine

## 2015-05-08 ENCOUNTER — Emergency Department (HOSPITAL_COMMUNITY): Payer: Medicare Other

## 2015-05-08 DIAGNOSIS — N189 Chronic kidney disease, unspecified: Secondary | ICD-10-CM | POA: Insufficient documentation

## 2015-05-08 DIAGNOSIS — S0990XA Unspecified injury of head, initial encounter: Secondary | ICD-10-CM | POA: Diagnosis present

## 2015-05-08 DIAGNOSIS — Y9389 Activity, other specified: Secondary | ICD-10-CM | POA: Insufficient documentation

## 2015-05-08 DIAGNOSIS — S0083XA Contusion of other part of head, initial encounter: Secondary | ICD-10-CM | POA: Insufficient documentation

## 2015-05-08 DIAGNOSIS — Y998 Other external cause status: Secondary | ICD-10-CM | POA: Diagnosis not present

## 2015-05-08 DIAGNOSIS — F419 Anxiety disorder, unspecified: Secondary | ICD-10-CM | POA: Insufficient documentation

## 2015-05-08 DIAGNOSIS — Z8719 Personal history of other diseases of the digestive system: Secondary | ICD-10-CM | POA: Diagnosis not present

## 2015-05-08 DIAGNOSIS — W1839XA Other fall on same level, initial encounter: Secondary | ICD-10-CM | POA: Diagnosis not present

## 2015-05-08 DIAGNOSIS — E119 Type 2 diabetes mellitus without complications: Secondary | ICD-10-CM | POA: Diagnosis not present

## 2015-05-08 DIAGNOSIS — F0390 Unspecified dementia without behavioral disturbance: Secondary | ICD-10-CM | POA: Diagnosis not present

## 2015-05-08 DIAGNOSIS — Z7982 Long term (current) use of aspirin: Secondary | ICD-10-CM | POA: Diagnosis not present

## 2015-05-08 DIAGNOSIS — Y92129 Unspecified place in nursing home as the place of occurrence of the external cause: Secondary | ICD-10-CM | POA: Diagnosis not present

## 2015-05-08 DIAGNOSIS — W19XXXA Unspecified fall, initial encounter: Secondary | ICD-10-CM

## 2015-05-08 DIAGNOSIS — Z79899 Other long term (current) drug therapy: Secondary | ICD-10-CM | POA: Insufficient documentation

## 2015-05-08 DIAGNOSIS — Z8739 Personal history of other diseases of the musculoskeletal system and connective tissue: Secondary | ICD-10-CM | POA: Diagnosis not present

## 2015-05-08 LAB — URINALYSIS, ROUTINE W REFLEX MICROSCOPIC
BILIRUBIN URINE: NEGATIVE
Glucose, UA: NEGATIVE mg/dL
HGB URINE DIPSTICK: NEGATIVE
Ketones, ur: 15 mg/dL — AB
Leukocytes, UA: NEGATIVE
Nitrite: NEGATIVE
PROTEIN: NEGATIVE mg/dL
Specific Gravity, Urine: 1.02 (ref 1.005–1.030)
pH: 6 (ref 5.0–8.0)

## 2015-05-08 LAB — URINE CULTURE

## 2015-05-08 NOTE — ED Provider Notes (Signed)
CSN: [id number]     Arrival date & time 05/08/15  1407 History   First MD Initiated Contact with Patient 05/08/15 [phone]     Chief Complaint  Patient presents with  . Fall     (Consider location/radiation/quality/duration/timing/severity/associated sxs/prior Treatment) HPI Comments: LEVEL 5 CAVEAT FOR severe dementia  Pt comes in with cc of fall. EMS reports that pt had unwitnessed fall at the nursing home. Pt has severe dementia and is non verbal. She has had hx of fall, with an ER visit few days back for the same.   Patient is a 82 y.o. female presenting with fall. The history is provided by medical records.  Fall    Past Medical History  Diagnosis Date  . Dementia   . Chronic kidney disease   . Osteoporosis   . GERD (gastroesophageal reflux disease)   . Anxiety   . Diabetes type 2, controlled (HCC)    History reviewed. No pertinent past surgical history. History reviewed. No pertinent family history. Social History  Substance Use Topics  . Smoking status: Never Smoker   . Smokeless tobacco: None  . Alcohol Use: No   OB History    No data available     Review of Systems  Unable to perform ROS: Dementia      Allergies  Review of patient's allergies indicates no known allergies.  Home Medications   Prior to Admission medications   Medication Sig Start Date End Date Taking? Authorizing Provider  acetaminophen (TYLENOL) 500 MG tablet Take 500 mg by mouth every 4 (four) hours as needed for mild pain, moderate pain, fever or headache.   Yes Historical Provider, MD  acetaminophen (TYLENOL) 650 MG CR tablet Take 650 mg by mouth 3 (three) times daily.   Yes Historical Provider, MD  ALPRAZolam (XANAX) 0.25 MG tablet Take 1 tablet (0.25 mg total) by mouth 2 (two) times daily. 01/27/15  Yes Alison Murray, MD  alum & mag hydroxide-simeth (MAALOX/MYLANTA) 200-200-20 MG/5ML suspension Take 30 mLs by mouth every 6 (six) hours as needed for indigestion or heartburn.   Yes  Historical Provider, MD  amLODipine (NORVASC) 5 MG tablet Take 1 tablet (5 mg total) by mouth daily. 01/27/15  Yes Alison Murray, MD  aspirin 81 MG chewable tablet Chew 81 mg by mouth daily.   Yes Historical Provider, MD  atorvastatin (LIPITOR) 20 MG tablet Take 20 mg by mouth at bedtime.    Yes Historical Provider, MD  divalproex (DEPAKOTE SPRINKLE) 125 MG capsule Take 250 mg by mouth 3 (three) times daily. 8AM ,2PM , 8PM   Yes Historical Provider, MD  guaifenesin (ROBITUSSIN) 100 MG/5ML syrup Take 200 mg by mouth every 6 (six) hours as needed for cough.   Yes Historical Provider, MD  haloperidol (HALDOL) 0.5 MG tablet Take 1 tablet (0.5 mg total) by mouth at bedtime. 01/27/15  Yes Alison Murray, MD  hydrocerin (EUCERIN) CREA Apply 1 application topically 2 (two) times daily. APPLY TO ENTIRE BODY   Yes Historical Provider, MD  loperamide (IMODIUM) 2 MG capsule Take 2 mg by mouth as needed for diarrhea or loose stools ((not to exceed 8 doses in 24 hours)).    Yes Historical Provider, MD  magnesium hydroxide (MILK OF MAGNESIA) 400 MG/5ML suspension Take 30 mLs by mouth at bedtime as needed for mild constipation or moderate constipation.   Yes Historical Provider, MD  Multiple Vitamin (DAILY VITE) TABS Take 1 tablet by mouth daily with breakfast.   Yes Historical  Provider, MD  neomycin-bacitracin-polymyxin (NEOSPORIN) [phone] ointment Apply 1 application topically as needed (apply to minor skin tears or abrasions, cover with band aid/gauze and tape. change as needed until healed).   Yes Historical Provider, MD  Nutritional Supplements (NUTRITIONAL DRINK) LIQD Take 1 Bottle by mouth 3 (three) times daily. *House Shake*   Yes Historical Provider, MD  rivastigmine (EXELON) 3 MG capsule Take 3 mg by mouth 2 (two) times daily.   Yes Historical Provider, MD  Vitamins-Lipotropics (LIPO-FLAVONOID PLUS PO) Take 1 tablet by mouth daily with breakfast.    Yes Historical Provider, MD   BP 146/74 mmHg  Pulse 93   Temp(Src) 97.9 F (36.6 C) (Oral)  Resp 12  SpO2 92% Physical Exam  Constitutional: She appears well-developed.  HENT:  Head: Normocephalic.  Eyes: EOM are normal.  Neck:  In c-collar  Cardiovascular: Normal rate.   Pulmonary/Chest: Effort normal.  Abdominal: Bowel sounds are normal.  Neurological:  Pt doesn't respond to any verbal stinmuli, L sided forehead hematoma seen OTHERWISE:  Head to toe evaluation shows no hematoma, bleeding of the scalp, no facial abrasions, step offs, crepitus, no tenderness to palpation of the bilateral upper and lower extremities, no gross deformities, no chest tenderness, no pelvic pain.   Skin: Skin is warm and dry.  Nursing note and vitals reviewed.   ED Course  Procedures (including critical care time) Labs Review Labs Reviewed  URINE CULTURE  URINALYSIS, ROUTINE W REFLEX MICROSCOPIC (NOT AT [facility])    Imaging Review No results found. I have personally reviewed and evaluated these images and lab results as part of my medical decision-making.   EKG Interpretation None      MDM   Final diagnoses:  Fall    DDx includes: - Mechanical falls - ICH - Fractures - Contusions - Soft tissue injury   Will get CT head, cspine due to severe limitation on hx and exam from the dementia. No pelvic tenderness noted, but will get screening xrays to ensure there is no break.    Derwood Kaplan, MD 05/08/15 1531

## 2015-05-08 NOTE — ED Notes (Signed)
Patient is [place] [facility] with a unwitnessed fall today. Patient has a hematoma to her head from a fall last week, but it is not verified wether she hit her head today. Patient was in the dining room, but no one saw the fall. Patient alert and non-verbal.

## 2015-05-08 NOTE — ED Notes (Signed)
Pt placed on posey belt for fall risk.

## 2015-05-08 NOTE — ED Notes (Signed)
Bed: WA19 Expected date:  Expected time:  Means of arrival:  Comments: EMS-fall 

## 2015-05-08 NOTE — ED Notes (Signed)
Per previous RN, patient just waiting on [facility]. Everything else has been completed.

## 2015-05-08 NOTE — Discharge Instructions (Signed)
We saw you in the ER after you had a fall. All the imaging results are normal, no fractures seen. No evidence of brain bleed. Please be very careful with walking, and do everything possible to prevent falls.   CT scan of the head, neck are normal. Xray of the hip is normal.   Fall Prevention in Hospitals, Adult As a hospital patient, your condition and the treatments you receive can increase your risk for falls. Some additional risk factors for falls in a hospital include:  Being in an unfamiliar environment.  Being on bed rest.  Your surgery.  Taking certain medicines.  Your tubing requirements, such as intravenous (IV) therapy or catheters. It is important that you learn how to decrease fall risks while at the hospital. Below are important tips that can help prevent falls. SAFETY TIPS FOR PREVENTING FALLS Talk about your risk of falling.  Ask your health care provider why you are at risk for falling. Is it your medicine, illness, tubing placement, or something else?  Make a plan with your health care provider to keep you safe from falls.  Ask your health care provider or pharmacist about side effects of your medicines. Some medicines can make you dizzy or affect your coordination. Ask for help.  Ask for help before getting out of bed. You may need to press your call button.  Ask for assistance in getting safely to the toilet.  Ask for a walker or cane to be put at your bedside. Ask that most of the side rails on your bed be placed up before your health care provider leaves the room.  Ask family or friends to sit with you.  Ask for things that are out of your reach, such as your glasses, hearing aids, telephone, bedside table, or call button. Follow these tips to avoid falling:  Stay lying or seated, rather than standing, while waiting for help.  Wear rubber-soled slippers or shoes whenever you walk in the hospital.  Avoid quick, sudden movements.  Change positions  slowly.  Sit on the side of your bed before standing.  Stand up slowly and wait before you start to walk.  Let your health care provider know if there is a spill on the floor.  Pay careful attention to the medical equipment, electrical cords, and tubes around you.  When you need help, use your call button by your bed or in the bathroom. Wait for one of your health care providers to help you.  If you feel dizzy or unsure of your footing, return to bed and wait for assistance.  Avoid being distracted by the TV, telephone, or another person in your room.  Do not lean or support yourself on rolling objects, such as IV poles or bedside tables.   This information is not intended to replace advice given to you by your health care provider. Make sure you discuss any questions you have with your health care provider.   Document Released: 02/27/2000 Document Revised: 03/22/2014 Document Reviewed: 11/07/2011 Elsevier Interactive Patient Education [employer]

## 2015-05-09 ENCOUNTER — Telehealth (HOSPITAL_COMMUNITY): Payer: Self-pay

## 2015-05-09 NOTE — Progress Notes (Signed)
ED Antimicrobial Stewardship Positive Culture Follow Up   Pamela Ramirez is an 82 y.o. female who presented to [facility] on 05/08/2015 with a chief complaint of  Chief Complaint  Patient presents with  . Fall    Recent Results (from the past 720 hour(s))  Urine culture     Status: None   Collection Time: 05/06/15 11:54 AM  Result Value Ref Range Status   Specimen Description URINE, CATHETERIZED  Final   Special Requests NONE  Final   Culture   Final    >=100,000 COLONIES/mL STAPHYLOCOCCUS SPECIES (COAGULASE NEGATIVE) Performed at [facility]    Report Status 05/08/2015 FINAL  Final   Organism ID, Bacteria STAPHYLOCOCCUS SPECIES (COAGULASE NEGATIVE)  Final      Susceptibility   Staphylococcus species (coagulase negative) - MIC*    CIPROFLOXACIN >=8 RESISTANT Resistant     GENTAMICIN <=0.5 SENSITIVE Sensitive     NITROFURANTOIN <=16 SENSITIVE Sensitive     OXACILLIN >=4 RESISTANT Resistant     TETRACYCLINE <=1 SENSITIVE Sensitive     VANCOMYCIN <=0.5 SENSITIVE Sensitive     TRIMETH/SULFA <=10 SENSITIVE Sensitive     CLINDAMYCIN <=0.25 RESISTANT Resistant     RIFAMPIN <=0.5 SENSITIVE Sensitive     Inducible Clindamycin POSITIVE Resistant     * >=100,000 COLONIES/mL STAPHYLOCOCCUS SPECIES (COAGULASE NEGATIVE)     Patient discharged originally without antimicrobial agent and treatment is now indicated   Most likely contaminant. No symptoms.  Plan: No treatment necessary  ED Provider: Cheri Fowler PA-C   Armandina Stammer 05/09/2015, 9:09 AM Infectious Diseases Pharmacist Phone# [phone]

## 2015-05-09 NOTE — Telephone Encounter (Signed)
Post ED Visit - Positive Culture Follow-up  Culture report reviewed by antimicrobial stewardship pharmacist:   Enzo Bi, Pharm.D.  Celedonio Miyamoto, Pharm.D., BCPS  Garvin Fila, Pharm.D.  Georgina Pillion, Pharm.D., BCPS  [place], [place].D., BCPS, AAHIVP  Estella Husk, Pharm.D., BCPS, AAHIVP  Tennis Must, Pharm.D.  Rob Oswaldo Done, [address].D.  Positive urine culture, >/= 100,000 colonies -> staph. species Chart reviewed by K. Rose PA "No treatment necessary"  Arvid Right 05/09/2015, 12:10 PM

## 2015-05-11 LAB — URINE CULTURE

## 2015-05-12 ENCOUNTER — Telehealth (HOSPITAL_BASED_OUTPATIENT_CLINIC_OR_DEPARTMENT_OTHER): Payer: Self-pay | Admitting: Emergency Medicine

## 2015-05-12 NOTE — Telephone Encounter (Signed)
Post ED Visit - Positive Culture Follow-up  Culture report reviewed by antimicrobial stewardship pharmacist:   Enzo Bi, Pharm.D.  Celedonio Miyamoto, Pharm.D., BCPS  Garvin Fila, Pharm.D.  Georgina Pillion, Pharm.D., BCPS  [place], [address].D., BCPS, AAHIVP  Estella Husk, Pharm.D., BCPS, AAHIVP  Tennis Must, [address].D.  Sherle Poe, [place].D.  Positive urine culture Staphylococcus and streptococcus Treated with none, no further treatment needed  Berle Mull 05/12/2015, 10:56 AM

## 2015-05-19 ENCOUNTER — Encounter (HOSPITAL_COMMUNITY): Payer: Self-pay | Admitting: *Deleted

## 2015-05-19 ENCOUNTER — Emergency Department (HOSPITAL_COMMUNITY)
Admission: EM | Admit: 2015-05-19 | Discharge: 2015-05-19 | Disposition: A | Discharge status: Home / Self Care | Payer: Medicare Other | Attending: Emergency Medicine | Admitting: Emergency Medicine

## 2015-05-19 ENCOUNTER — Emergency Department (HOSPITAL_COMMUNITY): Payer: Medicare Other

## 2015-05-19 DIAGNOSIS — Z8739 Personal history of other diseases of the musculoskeletal system and connective tissue: Secondary | ICD-10-CM | POA: Diagnosis not present

## 2015-05-19 DIAGNOSIS — Y9289 Other specified places as the place of occurrence of the external cause: Secondary | ICD-10-CM | POA: Diagnosis not present

## 2015-05-19 DIAGNOSIS — W1839XA Other fall on same level, initial encounter: Secondary | ICD-10-CM | POA: Diagnosis not present

## 2015-05-19 DIAGNOSIS — Y998 Other external cause status: Secondary | ICD-10-CM | POA: Diagnosis not present

## 2015-05-19 DIAGNOSIS — E119 Type 2 diabetes mellitus without complications: Secondary | ICD-10-CM | POA: Diagnosis not present

## 2015-05-19 DIAGNOSIS — S0191XA Laceration without foreign body of unspecified part of head, initial encounter: Secondary | ICD-10-CM

## 2015-05-19 DIAGNOSIS — F0390 Unspecified dementia without behavioral disturbance: Secondary | ICD-10-CM | POA: Insufficient documentation

## 2015-05-19 DIAGNOSIS — S01111A Laceration without foreign body of right eyelid and periocular area, initial encounter: Secondary | ICD-10-CM | POA: Insufficient documentation

## 2015-05-19 DIAGNOSIS — Z8719 Personal history of other diseases of the digestive system: Secondary | ICD-10-CM | POA: Diagnosis not present

## 2015-05-19 DIAGNOSIS — Z79899 Other long term (current) drug therapy: Secondary | ICD-10-CM | POA: Diagnosis not present

## 2015-05-19 DIAGNOSIS — N189 Chronic kidney disease, unspecified: Secondary | ICD-10-CM | POA: Insufficient documentation

## 2015-05-19 DIAGNOSIS — S0990XA Unspecified injury of head, initial encounter: Secondary | ICD-10-CM | POA: Diagnosis present

## 2015-05-19 DIAGNOSIS — Z7982 Long term (current) use of aspirin: Secondary | ICD-10-CM | POA: Diagnosis not present

## 2015-05-19 DIAGNOSIS — W19XXXA Unspecified fall, initial encounter: Secondary | ICD-10-CM

## 2015-05-19 DIAGNOSIS — F419 Anxiety disorder, unspecified: Secondary | ICD-10-CM | POA: Diagnosis not present

## 2015-05-19 DIAGNOSIS — Y9389 Activity, other specified: Secondary | ICD-10-CM | POA: Insufficient documentation

## 2015-05-19 NOTE — ED Notes (Signed)
[facility] Expected date:  Expected time:  Means of arrival:  Comments: EMS- 82yo F, fall/head lac/no thinners

## 2015-05-19 NOTE — ED Notes (Signed)
EMS called to [facility].  Staff called due to patient fall with laceration.  Patient found on floor with blood on face.   unwitnessed fall.  Patient has dementia and is unable to communicate.

## 2015-05-19 NOTE — Discharge Instructions (Signed)
Nonsutured Laceration Care °A laceration is a cut that goes through all layers of the skin and extends into the tissue that is right under the skin. This type of cut is usually stitched up (sutured) or closed with tape (adhesive strips) or skin glue shortly after the injury happens. °However, if the wound is dirty or if several hours pass before medical treatment is provided, it is likely that germs (bacteria) will enter the wound. Closing a laceration after bacteria have entered it increases the risk of infection. In these cases, your health care provider may leave the laceration open (nonsutured) and cover it with a bandage. This type of treatment helps prevent infection and allows the wound to heal from the deepest layer of tissue damage up to the surface. °An open fracture is a type of injury that may involve nonsutured lacerations. An open fracture is a break in a bone that happens along with one or more lacerations through the skin that is near the fracture site. °HOW TO CARE FOR YOUR NONSUTURED LACERATION °· Take or apply over-the-counter and prescription medicines only as told by your health care provider. °· If you were prescribed an antibiotic medicine, take or apply it as told by your health care provider. Do not stop using the antibiotic even if your condition improves. °· Clean the wound one time each day or as told by your health care provider. °· Wash the wound with mild soap and water. °· Rinse the wound with water to remove all soap. °· Pat your wound dry with a clean towel. Do not rub the wound. °· Do not inject anything into the wound unless your health care provider told you to. °· Change any bandages (dressings) as told by your health care provider. This includes changing the dressing if it gets wet, dirty, or starts to smell bad. °· Keep the dressing dry until your health care provider says it can be removed. Do not take baths, swim, or do anything that puts your wound underwater until your  health care provider approves. °· Raise (elevate) the injured area above the level of your heart while you are sitting or lying down, if possible. °· Do not scratch or pick at the wound. °· Check your wound every day for signs of infection. Watch for: °· Redness, swelling, or pain. °· Fluid, blood, or pus. °· Keep all follow-up visits as told by your health care provider. This is important. °SEEK MEDICAL CARE IF: °· You received a tetanus and shot and you have swelling, severe pain, redness, or bleeding at the injection site.   °· You have a fever. °· Your pain is not controlled with medicine. °· You have increased redness, swelling, or pain at the site of your wound. °· You have fluid, blood, or pus coming from your wound. °· You notice a bad smell coming from your wound or your dressing. °· You notice something coming out of the wound, such as wood or glass. °· You notice a change in the color of your skin near your wound. °· You develop a new rash. °· You need to change the dressing frequently due to fluid, blood, or pus draining from the wound. °· You develop numbness around your wound. °SEEK IMMEDIATE MEDICAL CARE IF: °· Your pain suddenly increases and is severe. °· You develop severe swelling around the wound. °· The wound is on your hand or foot and you cannot properly move a finger or toe. °· The wound is on your hand or   foot and you notice that your fingers or toes look pale or bluish. °· You have a red streak going away from your wound. °  °This information is not intended to replace advice given to you by your health care provider. Make sure you discuss any questions you have with your health care provider. °  °Document Released: 01/27/2006 Document Revised: 07/16/2014 Document Reviewed: 02/25/2014 °Elsevier Interactive Patient Education ©2016 Else[employer] Inc. ° °Head Injury, Adult °You have a head injury. Headaches and throwing up (vomiting) are common after a head injury. It should be easy to wake up from  sleeping. Sometimes you must stay in the hospital. Most problems happen within the first 24 hours. Side effects may occur up to 7-10 days after the injury.  °WHAT ARE THE TYPES OF HEAD INJURIES? °Head injuries can be as minor as a bump. Some head injuries can be more severe. More severe head injuries include: °· A jarring injury to the brain (concussion). °· A bruise of the brain (contusion). This mean there is bleeding in the brain that can cause swelling. °· A cracked skull (skull fracture). °· Bleeding in the brain that collects, clots, and forms a bump (hematoma). °WHEN SHOULD I GET HELP RIGHT AWAY?  °· You are confused or sleepy. °· You cannot be woken up. °· You feel sick to your stomach (nauseous) or keep throwing up (vomiting). °· Your dizziness or unsteadiness is getting worse. °· You have very bad, lasting headaches that are not helped by medicine. Take medicines only as told by your doctor. °· You cannot use your arms or legs like normal. °· You cannot walk. °· You notice changes in the black spots in the center of the colored part of your eye (pupil). °· You have clear or bloody fluid coming from your nose or ears. °· You have trouble seeing. °During the next 24 hours after the injury, you must stay with someone who can watch you. This person should get help right away (call 911 in the [place] if you start to shake and are not able to control it (have seizures), you pass out, or you are unable to wake up. °HOW CAN I PREVENT A HEAD INJURY IN THE FUTURE? °· Wear seat belts. °· Wear a helmet while bike riding and playing sports like football. °· Stay away from dangerous activities around the house. °WHEN CAN I RETURN TO NORMAL ACTIVITIES AND ATHLETICS? °See your doctor before doing these activities. You should not do normal activities or play contact sports until 1 week after the following symptoms have stopped: °· Headache that does not go away. °· Dizziness. °· Poor attention. °· Confusion. °· Memory  problems. °· Sickness to your stomach or throwing up. °· Tiredness. °· Fussiness. °· Bothered by bright lights or loud noises. °· Anxiousness or depression. °· Restless sleep. °MAKE SURE YOU:  °· Understand these instructions. °· Will watch your condition. °· Will get help right away if you are not doing well or get worse. °  °This information is not intended to replace advice given to you by your health care provider. Make sure you discuss any questions you have with your health care provider. °  °Document Released: 02/12/2008 Document Revised: 03/22/2014 Document Reviewed: 11/06/2012 °Elsevier Interactive Patient Education ©2016 Elsevier Inc. ° °

## 2015-05-19 NOTE — Progress Notes (Signed)
CSW attempted to meet with patient at bedside. However, she is not able to communicate. Per note, patient has a history of dementia.  Per [facility].  Trish MageBrittney Gaege Sangalang, LCSWA [phone] ED CSW 05/19/2015 4:51 PM

## 2015-05-19 NOTE — ED Notes (Signed)
[facility] transported pt before tech was able to obtain updated vitals

## 2015-05-19 NOTE — ED Notes (Signed)
Notified [facility] for [facility].

## 2015-05-19 NOTE — ED Provider Notes (Signed)
CSN: [id number]     Arrival date & time 05/19/15  1257 History   First MD Initiated Contact with Patient 05/19/15 1315     Chief Complaint  Patient presents with  . Fall     (Consider location/radiation/quality/duration/timing/severity/associated sxs/prior Treatment) HPI Comments: 82 year old female with past medical history including dementia, CK D, type 2 diabetes who presents with fall. History limited because of the patient's dementia and obtained from EMS. They report that the patient was found today with a laceration on her head. She had an unwitnessed fall; they found her on the floor with blood on her face. Patient unable to answer any questions. She has a history of frequent falls.  LEVEL 5 CAVEAT DUE TO DEMENTIA  Patient is a 82 y.o. female presenting with fall. The history is provided by the EMS personnel.  Fall    Past Medical History  Diagnosis Date  . Dementia   . Chronic kidney disease   . Osteoporosis   . GERD (gastroesophageal reflux disease)   . Anxiety   . Diabetes type 2, controlled (HCC)    History reviewed. No pertinent past surgical history. History reviewed. No pertinent family history. Social History  Substance Use Topics  . Smoking status: Never Smoker   . Smokeless tobacco: None  . Alcohol Use: No   OB History    No data available     Review of Systems  Unable to perform ROS: Dementia      Allergies  Review of patient's allergies indicates no known allergies.  Home Medications   Prior to Admission medications   Medication Sig Start Date End Date Taking? Authorizing Provider  acetaminophen (TYLENOL) 500 MG tablet Take 500 mg by mouth every 4 (four) hours as needed for mild pain, moderate pain, fever or headache.   Yes Historical Provider, MD  acetaminophen (TYLENOL) 650 MG CR tablet Take 650 mg by mouth 3 (three) times daily.   Yes Historical Provider, MD  ALPRAZolam (XANAX) 0.25 MG tablet Take 1 tablet (0.25 mg total) by mouth 2 (two)  times daily. 01/27/15  Yes Alison Murray, MD  alum & mag hydroxide-simeth (MAALOX/MYLANTA) 200-200-20 MG/5ML suspension Take 30 mLs by mouth every 6 (six) hours as needed for indigestion or heartburn.   Yes Historical Provider, MD  amLODipine (NORVASC) 5 MG tablet Take 1 tablet (5 mg total) by mouth daily. 01/27/15  Yes Alison Murray, MD  aspirin 81 MG chewable tablet Chew 81 mg by mouth daily.   Yes Historical Provider, MD  atorvastatin (LIPITOR) 20 MG tablet Take 20 mg by mouth at bedtime.    Yes Historical Provider, MD  divalproex (DEPAKOTE SPRINKLE) 125 MG capsule Take 250 mg by mouth 3 (three) times daily. 8AM ,2PM , 8PM   Yes Historical Provider, MD  guaifenesin (ROBITUSSIN) 100 MG/5ML syrup Take 200 mg by mouth every 6 (six) hours as needed for cough.   Yes Historical Provider, MD  haloperidol (HALDOL) 0.5 MG tablet Take 1 tablet (0.5 mg total) by mouth at bedtime. 01/27/15  Yes Alison Murray, MD  hydrocerin (EUCERIN) CREA Apply 1 application topically 2 (two) times daily. APPLY TO ENTIRE BODY   Yes Historical Provider, MD  loperamide (IMODIUM) 2 MG capsule Take 2 mg by mouth as needed for diarrhea or loose stools ((not to exceed 8 doses in 24 hours)).    Yes Historical Provider, MD  magnesium hydroxide (MILK OF MAGNESIA) 400 MG/5ML suspension Take 30 mLs by mouth at bedtime as needed for  mild constipation or moderate constipation.   Yes Historical Provider, MD  Multiple Vitamin (DAILY VITE) TABS Take 1 tablet by mouth daily with breakfast.   Yes Historical Provider, MD  neomycin-bacitracin-polymyxin (NEOSPORIN) [phone] ointment Apply 1 application topically as needed (apply to minor skin tears or abrasions, cover with band aid/gauze and tape. change as needed until healed).   Yes Historical Provider, MD  Nutritional Supplements (NUTRITIONAL DRINK) LIQD Take 1 Bottle by mouth 3 (three) times daily. *House Shake*   Yes Historical Provider, MD  rivastigmine (EXELON) 3 MG capsule Take 3 mg by  mouth 2 (two) times daily.   Yes Historical Provider, MD  Vitamins-Lipotropics (LIPO-FLAVONOID PLUS PO) Take 1 tablet by mouth daily with breakfast.    Yes Historical Provider, MD   BP 128/71 mmHg  Pulse 79  Temp(Src) 98.1 F (36.7 C) (Oral)  Resp 16  SpO2 98% Physical Exam  Constitutional: She appears well-developed and well-nourished. No distress.  HENT:  1cm hemostatic laceration lateral to R eyebrow  Eyes: Conjunctivae are normal. Pupils are equal, round, and reactive to light.  Neck:  In soft collar  Cardiovascular: Normal rate, regular rhythm and normal heart sounds.   No murmur heard. Pulmonary/Chest: Effort normal and breath sounds normal.  Abdominal: Soft. Bowel sounds are normal. She exhibits no distension. There is no tenderness.  Musculoskeletal: She exhibits no edema.  Moving all 4 ext  Neurological: She is alert.  Disoriented, unable to follow commands  Skin: Skin is warm and dry.  Psychiatric:  uncooperative  Nursing note and vitals reviewed.   ED Course  .Marland KitchenLaceration Repair Date/Time: 05/19/2015 3:10 PM Performed by: Laurence Spates Authorized by: Laurence Spates Consent: The procedure was performed in an emergent situation. Body area: head/neck Location details: forehead Laceration length: 1.5 cm Foreign bodies: no foreign bodies Irrigation solution: saline Amount of cleaning: standard Skin closure: glue Approximation: close Approximation difficulty: simple Patient tolerance: Patient tolerated the procedure well with no immediate complications   (including critical care time) Labs Review Labs Reviewed - No data to display  Imaging Review Ct Head Wo Contrast  05/19/2015  CLINICAL DATA:  Unwitnessed fall with scalp laceration and facial bleeding. History of dementia. EXAM: CT HEAD WITHOUT CONTRAST CT CERVICAL SPINE WITHOUT CONTRAST TECHNIQUE: Multidetector CT imaging of the head and cervical spine was performed following the standard  protocol without intravenous contrast. Multiplanar CT image reconstructions of the cervical spine were also generated. COMPARISON:  Prior studies 05/08/2015 and 05/06/2015. FINDINGS: CT HEAD FINDINGS Brain: There is no evidence of acute intracranial hemorrhage, mass lesion, brain edema or extra-axial fluid collection. Diffuse dilatation of the third and lateral ventricles is similar to the prior examination. There is chronic confluent low-density in the periventricular white matter bilaterally. There is no CT evidence of acute cortical infarction. Bones/sinuses/visualized face: There is chronic mucosal thickening in the ethmoid sinuses bilaterally. There are dependent opacities in both maxillary sinuses which were not previously imaged. The mastoid air cells and middle ears are clear. The calvarium is intact. There is some soft tissue swelling in the left frontal scalp, similar to the prior study. CT CERVICAL SPINE FINDINGS The cervical spine demonstrates stable reversal of lordosis without focal angulation or significant listhesis. There is no evidence of acute fracture or traumatic subluxation. No acute soft tissue findings are evident. There are stable degenerative changes with loss of disc height and uncinate spurring greatest at C3-4. Bilateral cervical ribs noted. IMPRESSION: 1. No acute intracranial or calvarial findings. 2. Stable  atrophy, chronic ventriculomegaly and periventricular white matter disease. 3. No evidence of acute cervical spine fracture, traumatic subluxation or static signs of instability. Electronically Signed   By: Carey BullocksWilliam  Veazey M.D.   On: 05/19/2015 14:55   Ct Cervical Spine Wo Contrast  05/19/2015  CLINICAL DATA:  Unwitnessed fall with scalp laceration and facial bleeding. History of dementia. EXAM: CT HEAD WITHOUT CONTRAST CT CERVICAL SPINE WITHOUT CONTRAST TECHNIQUE: Multidetector CT imaging of the head and cervical spine was performed following the standard protocol without  intravenous contrast. Multiplanar CT image reconstructions of the cervical spine were also generated. COMPARISON:  Prior studies 05/08/2015 and 05/06/2015. FINDINGS: CT HEAD FINDINGS Brain: There is no evidence of acute intracranial hemorrhage, mass lesion, brain edema or extra-axial fluid collection. Diffuse dilatation of the third and lateral ventricles is similar to the prior examination. There is chronic confluent low-density in the periventricular white matter bilaterally. There is no CT evidence of acute cortical infarction. Bones/sinuses/visualized face: There is chronic mucosal thickening in the ethmoid sinuses bilaterally. There are dependent opacities in both maxillary sinuses which were not previously imaged. The mastoid air cells and middle ears are clear. The calvarium is intact. There is some soft tissue swelling in the left frontal scalp, similar to the prior study. CT CERVICAL SPINE FINDINGS The cervical spine demonstrates stable reversal of lordosis without focal angulation or significant listhesis. There is no evidence of acute fracture or traumatic subluxation. No acute soft tissue findings are evident. There are stable degenerative changes with loss of disc height and uncinate spurring greatest at C3-4. Bilateral cervical ribs noted. IMPRESSION: 1. No acute intracranial or calvarial findings. 2. Stable atrophy, chronic ventriculomegaly and periventricular white matter disease. 3. No evidence of acute cervical spine fracture, traumatic subluxation or static signs of instability. Electronically Signed   By: Carey BullocksWilliam  Veazey M.D.   On: 05/19/2015 14:55    MDM   Final diagnoses:  Laceration of head, initial encounter  Fall, initial encounter   Pt w/ h/o frequent falls p/w Unwitnessed fall during which she sustained a laceration to her head. On exam, she was awake, unable to answer questions but in no acute distress. Vital signs stable. She had a laceration lateral to her right eyebrow that  was hemostatic. I had difficulty examining  laceration due to patient being uncooperative. Obtained CT of head and C-spine. Bedside nurse cleaned laceration and I repaired with Dermabond as patient uncooperative to allow for suturing. CT of head and C-spine negative for acute injury. Patient comfortable on reexamination and discharged in satisfactory condition.   Laurence Spatesachel Morgan Caylah Plouff, MD 05/19/15 [phone]

## 2015-06-10 ENCOUNTER — Encounter (HOSPITAL_COMMUNITY): Payer: Self-pay | Admitting: Emergency Medicine

## 2015-06-10 ENCOUNTER — Emergency Department (HOSPITAL_COMMUNITY): Payer: Medicare Other

## 2015-06-10 ENCOUNTER — Emergency Department (HOSPITAL_COMMUNITY)
Admission: EM | Admit: 2015-06-10 | Discharge: 2015-06-10 | Disposition: A | Discharge status: Home / Self Care | Payer: Medicare Other | Attending: Emergency Medicine | Admitting: Emergency Medicine

## 2015-06-10 DIAGNOSIS — Z79899 Other long term (current) drug therapy: Secondary | ICD-10-CM | POA: Diagnosis not present

## 2015-06-10 DIAGNOSIS — Y9289 Other specified places as the place of occurrence of the external cause: Secondary | ICD-10-CM | POA: Diagnosis not present

## 2015-06-10 DIAGNOSIS — N189 Chronic kidney disease, unspecified: Secondary | ICD-10-CM | POA: Diagnosis not present

## 2015-06-10 DIAGNOSIS — Z8719 Personal history of other diseases of the digestive system: Secondary | ICD-10-CM | POA: Diagnosis not present

## 2015-06-10 DIAGNOSIS — E119 Type 2 diabetes mellitus without complications: Secondary | ICD-10-CM | POA: Insufficient documentation

## 2015-06-10 DIAGNOSIS — F419 Anxiety disorder, unspecified: Secondary | ICD-10-CM | POA: Insufficient documentation

## 2015-06-10 DIAGNOSIS — Y998 Other external cause status: Secondary | ICD-10-CM | POA: Insufficient documentation

## 2015-06-10 DIAGNOSIS — F0390 Unspecified dementia without behavioral disturbance: Secondary | ICD-10-CM | POA: Diagnosis not present

## 2015-06-10 DIAGNOSIS — Z8739 Personal history of other diseases of the musculoskeletal system and connective tissue: Secondary | ICD-10-CM | POA: Insufficient documentation

## 2015-06-10 DIAGNOSIS — S0011XA Contusion of right eyelid and periocular area, initial encounter: Secondary | ICD-10-CM | POA: Diagnosis not present

## 2015-06-10 DIAGNOSIS — Z7982 Long term (current) use of aspirin: Secondary | ICD-10-CM | POA: Diagnosis not present

## 2015-06-10 DIAGNOSIS — Z23 Encounter for immunization: Secondary | ICD-10-CM | POA: Insufficient documentation

## 2015-06-10 DIAGNOSIS — Y9389 Activity, other specified: Secondary | ICD-10-CM | POA: Diagnosis not present

## 2015-06-10 DIAGNOSIS — W1839XA Other fall on same level, initial encounter: Secondary | ICD-10-CM | POA: Insufficient documentation

## 2015-06-10 DIAGNOSIS — S0990XA Unspecified injury of head, initial encounter: Secondary | ICD-10-CM | POA: Diagnosis present

## 2015-06-10 DIAGNOSIS — W19XXXA Unspecified fall, initial encounter: Secondary | ICD-10-CM

## 2015-06-10 MED ORDER — TETANUS-DIPHTH-ACELL PERTUSSIS 5-2.5-18.5 LF-MCG/0.5 IM SUSP
0.5000 mL | Freq: Once | INTRAMUSCULAR | Status: AC
  Administered 2015-06-10: 0.5 mL via INTRAMUSCULAR
  Filled 2015-06-10: qty 0.5

## 2015-06-10 NOTE — ED Notes (Signed)
[facility]. Right sided head injury. Aspirin a day. CBG 69.

## 2015-06-10 NOTE — Discharge Instructions (Signed)
Fall Prevention in the Home  Pamela Ramirez, your CT scans do not show any injury.  See your primary care doctor within 3 days for close follow up. If symptoms worsen, come back to the ED immediately. Thank you. Falls can cause injuries. They can happen to people of all ages. There are many things you can do to make your home safe and to help prevent falls.  WHAT CAN I DO ON THE OUTSIDE OF MY HOME?  Regularly fix the edges of walkways and driveways and fix any cracks.  Remove anything that might make you trip as you walk through a door, such as a raised step or threshold.  Trim any bushes or trees on the path to your home.  Use bright outdoor lighting.  Clear any walking paths of anything that might make someone trip, such as rocks or tools.  Regularly check to see if handrails are loose or broken. Make sure that both sides of any steps have handrails.  Any raised decks and porches should have guardrails on the edges.  Have any leaves, snow, or ice cleared regularly.  Use sand or salt on walking paths during winter.  Clean up any spills in your garage right away. This includes oil or grease spills. WHAT CAN I DO IN THE BATHROOM?   Use night lights.  Install grab bars by the toilet and in the tub and shower. Do not use towel bars as grab bars.  Use non-skid mats or decals in the tub or shower.  If you need to sit down in the shower, use a plastic, non-slip stool.  Keep the floor dry. Clean up any water that spills on the floor as soon as it happens.  Remove soap buildup in the tub or shower regularly.  Attach bath mats securely with double-sided non-slip rug tape.  Do not have throw rugs and other things on the floor that can make you trip. WHAT CAN I DO IN THE BEDROOM?  Use night lights.  Make sure that you have a light by your bed that is easy to reach.  Do not use any sheets or blankets that are too big for your bed. They should not hang down onto the floor.  Have a  firm chair that has side arms. You can use this for support while you get dressed.  Do not have throw rugs and other things on the floor that can make you trip. WHAT CAN I DO IN THE KITCHEN?  Clean up any spills right away.  Avoid walking on wet floors.  Keep items that you use a lot in easy-to-reach places.  If you need to reach something above you, use a strong step stool that has a grab bar.  Keep electrical cords out of the way.  Do not use floor polish or wax that makes floors slippery. If you must use wax, use non-skid floor wax.  Do not have throw rugs and other things on the floor that can make you trip. WHAT CAN I DO WITH MY STAIRS?  Do not leave any items on the stairs.  Make sure that there are handrails on both sides of the stairs and use them. Fix handrails that are broken or loose. Make sure that handrails are as long as the stairways.  Check any carpeting to make sure that it is firmly attached to the stairs. Fix any carpet that is loose or worn.  Avoid having throw rugs at the top or bottom of the stairs. If  you do have throw rugs, attach them to the floor with carpet tape.  Make sure that you have a light switch at the top of the stairs and the bottom of the stairs. If you do not have them, ask someone to add them for you. WHAT ELSE CAN I DO TO HELP PREVENT FALLS?  Wear shoes that:  Do not have high heels.  Have rubber bottoms.  Are comfortable and fit you well.  Are closed at the toe. Do not wear sandals.  If you use a stepladder:  Make sure that it is fully opened. Do not climb a closed stepladder.  Make sure that both sides of the stepladder are locked into place.  Ask someone to hold it for you, if possible.  Clearly mark and make sure that you can see:  Any grab bars or handrails.  First and last steps.  Where the edge of each step is.  Use tools that help you move around (mobility aids) if they are needed. These  include:  Canes.  Walkers.  Scooters.  Crutches.  Turn on the lights when you go into a dark area. Replace any light bulbs as soon as they burn out.  Set up your furniture so you have a clear path. Avoid moving your furniture around.  If any of your floors are uneven, fix them.  If there are any pets around you, be aware of where they are.  Review your medicines with your doctor. Some medicines can make you feel dizzy. This can increase your chance of falling. Ask your doctor what other things that you can do to help prevent falls.   This information is not intended to replace advice given to you by your health care provider. Make sure you discuss any questions you have with your health care provider.   Document Released: 12/26/2008 Document Revised: 07/16/2014 Document Reviewed: 04/05/2014 Elsevier Interactive Patient Education [employer]

## 2015-06-10 NOTE — ED Notes (Signed)
Bed: [id number] Expected date:  Expected time:  Means of arrival:  Comments: 82 yr old, fall, head injury

## 2015-06-10 NOTE — ED Provider Notes (Signed)
CSN: [id number]     Arrival date & time 06/10/15  0224 History  By signing my name below, I, Rohini Rajnarayanan, attest that this documentation has been prepared under the direction and in the presence of Tomasita Crumble, MD Electronically Signed: Charlean Merl, ED Scribe 06/10/2015 at 3:01 AM.    Chief Complaint  Patient presents with  . Fall  . Head Injury   *LEVEL 5 CAVEAT*  The history is provided by the EMS personnel and the nursing home. History limited by: Dementia. No language interpreter was used.    HPI Comments: Pamela Ramirez is a 82 y.o. female with dementia, who presents to the Emergency Department from [facility], complaining of a right sided head injury s/p a fall today. Pt is currently taking aspirin every day.   Past Medical History  Diagnosis Date  . Dementia   . Chronic kidney disease   . Osteoporosis   . GERD (gastroesophageal reflux disease)   . Anxiety   . Diabetes type 2, controlled (HCC)    History reviewed. No pertinent past surgical history. No family history on file. Social History  Substance Use Topics  . Smoking status: Never Smoker   . Smokeless tobacco: None  . Alcohol Use: No   OB History    No data available     Review of Systems 10 Systems reviewed and all are negative for acute change except as noted in the HPI.  Allergies  Review of patient's allergies indicates no known allergies.  Home Medications   Prior to Admission medications   Medication Sig Start Date End Date Taking? Authorizing Provider  acetaminophen (TYLENOL) 500 MG tablet Take 500 mg by mouth every 4 (four) hours as needed for mild pain, moderate pain, fever or headache.   Yes Historical Provider, MD  acetaminophen (TYLENOL) 650 MG CR tablet Take 650 mg by mouth 3 (three) times daily.   Yes Historical Provider, MD  ALPRAZolam (XANAX) 0.25 MG tablet Take 1 tablet (0.25 mg total) by mouth 2 (two) times daily. 01/27/15  Yes Alison Murray, MD  alum & mag  hydroxide-simeth (MAALOX/MYLANTA) 200-200-20 MG/5ML suspension Take 30 mLs by mouth every 6 (six) hours as needed for indigestion or heartburn.   Yes Historical Provider, MD  amLODipine (NORVASC) 5 MG tablet Take 1 tablet (5 mg total) by mouth daily. 01/27/15  Yes Alison Murray, MD  aspirin 81 MG chewable tablet Chew 81 mg by mouth daily.   Yes Historical Provider, MD  atorvastatin (LIPITOR) 20 MG tablet Take 20 mg by mouth at bedtime.    Yes Historical Provider, MD  divalproex (DEPAKOTE SPRINKLE) 125 MG capsule Take 250 mg by mouth 3 (three) times daily. 8AM ,2PM , 8PM   Yes Historical Provider, MD  guaifenesin (ROBITUSSIN) 100 MG/5ML syrup Take 200 mg by mouth every 6 (six) hours as needed for cough.   Yes Historical Provider, MD  haloperidol (HALDOL) 0.5 MG tablet Take 1 tablet (0.5 mg total) by mouth at bedtime. 01/27/15  Yes Alison Murray, MD  loperamide (IMODIUM) 2 MG capsule Take 2 mg by mouth as needed for diarrhea or loose stools ((not to exceed 8 doses in 24 hours)).    Yes Historical Provider, MD  magnesium hydroxide (MILK OF MAGNESIA) 400 MG/5ML suspension Take 30 mLs by mouth at bedtime as needed for mild constipation or moderate constipation.   Yes Historical Provider, MD  Multiple Vitamin (DAILY VITE) TABS Take 1 tablet by mouth daily with breakfast.  Yes Historical Provider, MD  neomycin-bacitracin-polymyxin (NEOSPORIN) [phone] ointment Apply 1 application topically as needed (apply to minor skin tears or abrasions, cover with band aid/gauze and tape. change as needed until healed).   Yes Historical Provider, MD  Nutritional Supplements (NUTRITIONAL DRINK) LIQD Take 1 Bottle by mouth 3 (three) times daily. *House Shake*   Yes Historical Provider, MD  rivastigmine (EXELON) 3 MG capsule Take 3 mg by mouth 2 (two) times daily.   Yes Historical Provider, MD  Vitamins-Lipotropics (LIPO-FLAVONOID PLUS PO) Take 1 tablet by mouth daily with breakfast.    Yes Historical Provider, MD   BP  124/72 mmHg  Pulse 82  Resp 16  SpO2 96% Physical Exam  Constitutional: She appears well-developed and well-nourished. No distress.  HENT:  Head: Atraumatic.  Nose: Nose normal.  Mouth/Throat: Oropharynx is clear and moist. No oropharyngeal exudate.  0.5 cm Hematoma to right eyebrow with TTP. Minimal bleeding.   Eyes: Conjunctivae and EOM are normal. Pupils are equal, round, and reactive to light. No scleral icterus.  Neck: Normal range of motion. Neck supple. No JVD present. No tracheal deviation present. No thyromegaly present.  C-Collar in place.   Cardiovascular: Normal rate, regular rhythm and normal heart sounds.  Exam reveals no gallop and no friction rub.   No murmur heard. Pulmonary/Chest: Effort normal and breath sounds normal. No respiratory distress. She has no wheezes. She exhibits no tenderness.  Abdominal: Soft. Bowel sounds are normal. She exhibits no distension and no mass. There is no tenderness. There is no rebound and no guarding.  Musculoskeletal: Normal range of motion. She exhibits no edema or tenderness.  Moves all extremities.   Lymphadenopathy:    She has no cervical adenopathy.  Neurological: She is alert. No cranial nerve deficit. She exhibits normal muscle tone.  Skin: Skin is warm and dry. No rash noted. No erythema. No pallor.  Nursing note and vitals reviewed.   ED Course  Procedures  DIAGNOSTIC STUDIES: Oxygen Saturation is 96% on RA, adequate by my interpretation.    COORDINATION OF CARE: 2:55 AM-Discussed treatment plan which includes CT maxillofacial and CT head, with pt at bedside and pt agreed to plan.   Labs Review Labs Reviewed - No data to display  Imaging Review Ct Head Wo Contrast  06/10/2015  CLINICAL DATA:  Status post fall. Hit right side of face, with swelling about the right eyebrow. Concern for head or cervical spine injury. Initial encounter. EXAM: CT HEAD WITHOUT CONTRAST CT MAXILLOFACIAL WITHOUT CONTRAST CT CERVICAL SPINE  WITHOUT CONTRAST TECHNIQUE: Multidetector CT imaging of the head, cervical spine, and maxillofacial structures were performed using the standard protocol without intravenous contrast. Multiplanar CT image reconstructions of the cervical spine and maxillofacial structures were also generated. COMPARISON:  CT of the head and cervical spine performed 05/19/2015 FINDINGS: CT HEAD FINDINGS There is no evidence of acute infarction, mass lesion, or intra- or extra-axial hemorrhage on CT. Prominence of the ventricles and sulci reflects moderate cortical volume loss. Scattered periventricular and subcortical white matter change likely reflects small ischemic microangiopathy. The brainstem and fourth ventricle are within normal limits. The basal ganglia are unremarkable in appearance. The cerebral hemispheres demonstrate grossly normal gray-white differentiation. No mass effect or midline shift is seen. There is no evidence of fracture; visualized osseous structures are unremarkable in appearance. The visualized portions of the orbits are within normal limits. Mucosal thickening is noted at the maxillary sinuses bilaterally. The remaining paranasal sinuses and mastoid air cells are well-aerated. Mild  soft swelling is noted overlying the right orbit, and at the left frontal calvarium. CT MAXILLOFACIAL FINDINGS There is no evidence of fracture or dislocation. The maxilla and mandible appear intact. The nasal bone is unremarkable in appearance. There is complete chronic absence of the dentition. The orbits are intact bilaterally. Mucosal thickening is noted at the maxillary sinuses bilaterally. The remaining visualized paranasal sinuses and mastoid air cells are well-aerated. Soft tissue swelling is noted overlying the right orbit. The parapharyngeal fat planes are preserved. The nasopharynx, oropharynx and hypopharynx are unremarkable in appearance. The visualized portions of the valleculae and piriform sinuses are grossly  unremarkable. The parotid and submandibular glands are within normal limits. No cervical lymphadenopathy is seen. CT CERVICAL SPINE FINDINGS There is no evidence of fracture or subluxation. Vertebral bodies demonstrate normal height and alignment. Intervertebral disc space narrowing is noted at C3-C4, with scattered anterior and posterior disc osteophyte complexes. Prevertebral soft tissues are within normal limits. The thyroid gland is unremarkable in appearance. The visualized lung apices are clear. No significant soft tissue abnormalities are seen. IMPRESSION: 1. No evidence of traumatic intracranial injury or fracture. 2. No evidence of fracture or dislocation with regard to the maxillofacial structures. 3. No evidence of fracture or subluxation along the cervical spine. 4. Mild soft tissue swelling overlying the right orbit, and at the left frontal calvarium. 5. Moderate cortical volume loss and scattered small vessel ischemic microangiopathy. 6. Mucosal thickening at the maxillary sinuses bilaterally. Electronically Signed   By: Roanna Raider M.D.   On: 06/10/2015 04:41   Ct Cervical Spine Wo Contrast  06/10/2015  CLINICAL DATA:  Status post fall. Hit right side of face, with swelling about the right eyebrow. Concern for head or cervical spine injury. Initial encounter. EXAM: CT HEAD WITHOUT CONTRAST CT MAXILLOFACIAL WITHOUT CONTRAST CT CERVICAL SPINE WITHOUT CONTRAST TECHNIQUE: Multidetector CT imaging of the head, cervical spine, and maxillofacial structures were performed using the standard protocol without intravenous contrast. Multiplanar CT image reconstructions of the cervical spine and maxillofacial structures were also generated. COMPARISON:  CT of the head and cervical spine performed 05/19/2015 FINDINGS: CT HEAD FINDINGS There is no evidence of acute infarction, mass lesion, or intra- or extra-axial hemorrhage on CT. Prominence of the ventricles and sulci reflects moderate cortical volume loss.  Scattered periventricular and subcortical white matter change likely reflects small ischemic microangiopathy. The brainstem and fourth ventricle are within normal limits. The basal ganglia are unremarkable in appearance. The cerebral hemispheres demonstrate grossly normal gray-white differentiation. No mass effect or midline shift is seen. There is no evidence of fracture; visualized osseous structures are unremarkable in appearance. The visualized portions of the orbits are within normal limits. Mucosal thickening is noted at the maxillary sinuses bilaterally. The remaining paranasal sinuses and mastoid air cells are well-aerated. Mild soft swelling is noted overlying the right orbit, and at the left frontal calvarium. CT MAXILLOFACIAL FINDINGS There is no evidence of fracture or dislocation. The maxilla and mandible appear intact. The nasal bone is unremarkable in appearance. There is complete chronic absence of the dentition. The orbits are intact bilaterally. Mucosal thickening is noted at the maxillary sinuses bilaterally. The remaining visualized paranasal sinuses and mastoid air cells are well-aerated. Soft tissue swelling is noted overlying the right orbit. The parapharyngeal fat planes are preserved. The nasopharynx, oropharynx and hypopharynx are unremarkable in appearance. The visualized portions of the valleculae and piriform sinuses are grossly unremarkable. The parotid and submandibular glands are within normal limits. No cervical lymphadenopathy  is seen. CT CERVICAL SPINE FINDINGS There is no evidence of fracture or subluxation. Vertebral bodies demonstrate normal height and alignment. Intervertebral disc space narrowing is noted at C3-C4, with scattered anterior and posterior disc osteophyte complexes. Prevertebral soft tissues are within normal limits. The thyroid gland is unremarkable in appearance. The visualized lung apices are clear. No significant soft tissue abnormalities are seen. IMPRESSION:  1. No evidence of traumatic intracranial injury or fracture. 2. No evidence of fracture or dislocation with regard to the maxillofacial structures. 3. No evidence of fracture or subluxation along the cervical spine. 4. Mild soft tissue swelling overlying the right orbit, and at the left frontal calvarium. 5. Moderate cortical volume loss and scattered small vessel ischemic microangiopathy. 6. Mucosal thickening at the maxillary sinuses bilaterally. Electronically Signed   By: Roanna Raider M.D.   On: 06/10/2015 04:41   Ct Maxillofacial Wo Cm  06/10/2015  CLINICAL DATA:  Status post fall. Hit right side of face, with swelling about the right eyebrow. Concern for head or cervical spine injury. Initial encounter. EXAM: CT HEAD WITHOUT CONTRAST CT MAXILLOFACIAL WITHOUT CONTRAST CT CERVICAL SPINE WITHOUT CONTRAST TECHNIQUE: Multidetector CT imaging of the head, cervical spine, and maxillofacial structures were performed using the standard protocol without intravenous contrast. Multiplanar CT image reconstructions of the cervical spine and maxillofacial structures were also generated. COMPARISON:  CT of the head and cervical spine performed 05/19/2015 FINDINGS: CT HEAD FINDINGS There is no evidence of acute infarction, mass lesion, or intra- or extra-axial hemorrhage on CT. Prominence of the ventricles and sulci reflects moderate cortical volume loss. Scattered periventricular and subcortical white matter change likely reflects small ischemic microangiopathy. The brainstem and fourth ventricle are within normal limits. The basal ganglia are unremarkable in appearance. The cerebral hemispheres demonstrate grossly normal gray-white differentiation. No mass effect or midline shift is seen. There is no evidence of fracture; visualized osseous structures are unremarkable in appearance. The visualized portions of the orbits are within normal limits. Mucosal thickening is noted at the maxillary sinuses bilaterally. The  remaining paranasal sinuses and mastoid air cells are well-aerated. Mild soft swelling is noted overlying the right orbit, and at the left frontal calvarium. CT MAXILLOFACIAL FINDINGS There is no evidence of fracture or dislocation. The maxilla and mandible appear intact. The nasal bone is unremarkable in appearance. There is complete chronic absence of the dentition. The orbits are intact bilaterally. Mucosal thickening is noted at the maxillary sinuses bilaterally. The remaining visualized paranasal sinuses and mastoid air cells are well-aerated. Soft tissue swelling is noted overlying the right orbit. The parapharyngeal fat planes are preserved. The nasopharynx, oropharynx and hypopharynx are unremarkable in appearance. The visualized portions of the valleculae and piriform sinuses are grossly unremarkable. The parotid and submandibular glands are within normal limits. No cervical lymphadenopathy is seen. CT CERVICAL SPINE FINDINGS There is no evidence of fracture or subluxation. Vertebral bodies demonstrate normal height and alignment. Intervertebral disc space narrowing is noted at C3-C4, with scattered anterior and posterior disc osteophyte complexes. Prevertebral soft tissues are within normal limits. The thyroid gland is unremarkable in appearance. The visualized lung apices are clear. No significant soft tissue abnormalities are seen. IMPRESSION: 1. No evidence of traumatic intracranial injury or fracture. 2. No evidence of fracture or dislocation with regard to the maxillofacial structures. 3. No evidence of fracture or subluxation along the cervical spine. 4. Mild soft tissue swelling overlying the right orbit, and at the left frontal calvarium. 5. Moderate cortical volume loss and scattered  small vessel ischemic microangiopathy. 6. Mucosal thickening at the maxillary sinuses bilaterally. Electronically Signed   By: Roanna RaiderJeffery  Chang M.D.   On: 06/10/2015 04:41   I have personally reviewed and evaluated  these images and lab results as part of my medical decision-making.   EKG Interpretation None      MDM   Final diagnoses:  None    Patient presents to the ED after a fall.  She has an obvious soft tissue injury and requires CT scan for evaluation.  Tetanus was updated.  No laceration to repair.    CTs do not show any significant injury.  Collar cleared.  She remains at her baseline after a period of observation in the ED.  No acute changes.  VS remain within her normal limits and she is  Safe for DC.   I personally performed the services described in this documentation, which was scribed in my presence. The recorded information has been reviewed and is accurate.       Tomasita CrumbleAdeleke Taesean Reth, MD 06/10/15 1556

## 2015-08-11 ENCOUNTER — Emergency Department (HOSPITAL_COMMUNITY)
Admission: EM | Admit: 2015-08-11 | Discharge: 2015-08-11 | Disposition: A | Discharge status: Home / Self Care | Payer: Medicare Other | Attending: Emergency Medicine | Admitting: Emergency Medicine

## 2015-08-11 ENCOUNTER — Emergency Department (HOSPITAL_BASED_OUTPATIENT_CLINIC_OR_DEPARTMENT_OTHER)
Admit: 2015-08-11 | Discharge: 2015-08-11 | Disposition: A | Discharge status: Home / Self Care | Payer: Medicare Other | Attending: Emergency Medicine | Admitting: Emergency Medicine

## 2015-08-11 ENCOUNTER — Encounter (HOSPITAL_COMMUNITY): Payer: Self-pay | Admitting: Emergency Medicine

## 2015-08-11 ENCOUNTER — Emergency Department (HOSPITAL_COMMUNITY): Payer: Medicare Other

## 2015-08-11 DIAGNOSIS — M810 Age-related osteoporosis without current pathological fracture: Secondary | ICD-10-CM | POA: Insufficient documentation

## 2015-08-11 DIAGNOSIS — M7989 Other specified soft tissue disorders: Secondary | ICD-10-CM

## 2015-08-11 DIAGNOSIS — M79604 Pain in right leg: Secondary | ICD-10-CM | POA: Insufficient documentation

## 2015-08-11 DIAGNOSIS — N189 Chronic kidney disease, unspecified: Secondary | ICD-10-CM | POA: Diagnosis not present

## 2015-08-11 DIAGNOSIS — F0390 Unspecified dementia without behavioral disturbance: Secondary | ICD-10-CM | POA: Diagnosis not present

## 2015-08-11 DIAGNOSIS — Z79899 Other long term (current) drug therapy: Secondary | ICD-10-CM | POA: Diagnosis not present

## 2015-08-11 DIAGNOSIS — E1129 Type 2 diabetes mellitus with other diabetic kidney complication: Secondary | ICD-10-CM | POA: Insufficient documentation

## 2015-08-11 DIAGNOSIS — Z7982 Long term (current) use of aspirin: Secondary | ICD-10-CM | POA: Diagnosis not present

## 2015-08-11 NOTE — Progress Notes (Signed)
VASCULAR LAB PRELIMINARY  PRELIMINARY  PRELIMINARY  PRELIMINARY  Right lower extremity venous duplex completed.      Right:  No evidence of DVT, superficial thrombosis, or Baker's cyst.  Gave result to patient's nurse @11 :05 am.   Jenetta Logesami Elin Fenley, RVT, RDMS 08/11/2015, 11:08 AM

## 2015-08-11 NOTE — ED Provider Notes (Signed)
CSN: [id number]     Arrival date & time 08/11/15  4098 History   First MD Initiated Contact with Patient 08/11/15 [phone]     Chief Complaint  Patient presents with  . Leg Pain    HPI Comments: 82 year old female who presents from [place]. History is obtained by nursing staff and EMS due to patient being non-verbal due to dementia. Nursing staff at the facility were moving the patient this morning and noticed her grimacing more when they moved her right leg and foot. No fall or previous injury reported.  Patient is a 82 y.o. female presenting with leg pain.  Leg Pain   Past Medical History  Diagnosis Date  . Dementia   . Chronic kidney disease   . Osteoporosis   . GERD (gastroesophageal reflux disease)   . Anxiety   . Diabetes type 2, controlled (HCC)    History reviewed. No pertinent past surgical history. History reviewed. No pertinent family history. Social History  Substance Use Topics  . Smoking status: Never Smoker   . Smokeless tobacco: None  . Alcohol Use: No   OB History    No data available     Review of Systems  Unable to perform ROS: Dementia    Allergies  Review of patient's allergies indicates no known allergies.  Home Medications   Prior to Admission medications   Medication Sig Start Date End Date Taking? Authorizing Provider  acetaminophen (TYLENOL) 500 MG tablet Take 500 mg by mouth every 4 (four) hours as needed for mild pain, moderate pain, fever or headache.    Historical Provider, MD  acetaminophen (TYLENOL) 650 MG CR tablet Take 650 mg by mouth 3 (three) times daily.    Historical Provider, MD  ALPRAZolam Prudy Feeler) 0.25 MG tablet Take 1 tablet (0.25 mg total) by mouth 2 (two) times daily. 01/27/15   Alison Murray, MD  alum & mag hydroxide-simeth (MAALOX/MYLANTA) 200-200-20 MG/5ML suspension Take 30 mLs by mouth every 6 (six) hours as needed for indigestion or heartburn.    Historical Provider, MD  amLODipine (NORVASC) 5 MG tablet Take 1  tablet (5 mg total) by mouth daily. 01/27/15   Alison Murray, MD  aspirin 81 MG chewable tablet Chew 81 mg by mouth daily.    Historical Provider, MD  atorvastatin (LIPITOR) 20 MG tablet Take 20 mg by mouth at bedtime.     Historical Provider, MD  divalproex (DEPAKOTE SPRINKLE) 125 MG capsule Take 250 mg by mouth 3 (three) times daily. 8AM ,2PM , 8PM    Historical Provider, MD  guaifenesin (ROBITUSSIN) 100 MG/5ML syrup Take 200 mg by mouth every 6 (six) hours as needed for cough.    Historical Provider, MD  haloperidol (HALDOL) 0.5 MG tablet Take 1 tablet (0.5 mg total) by mouth at bedtime. 01/27/15   Alison Murray, MD  loperamide (IMODIUM) 2 MG capsule Take 2 mg by mouth as needed for diarrhea or loose stools ((not to exceed 8 doses in 24 hours)).     Historical Provider, MD  magnesium hydroxide (MILK OF MAGNESIA) 400 MG/5ML suspension Take 30 mLs by mouth at bedtime as needed for mild constipation or moderate constipation.    Historical Provider, MD  Multiple Vitamin (DAILY VITE) TABS Take 1 tablet by mouth daily with breakfast.    Historical Provider, MD  neomycin-bacitracin-polymyxin (NEOSPORIN) [phone] ointment Apply 1 application topically as needed (apply to minor skin tears or abrasions, cover with band aid/gauze and tape. change as needed  until healed).    Historical Provider, MD  Nutritional Supplements (NUTRITIONAL DRINK) LIQD Take 1 Bottle by mouth 3 (three) times daily. *House Shake*    Historical Provider, MD  rivastigmine (EXELON) 3 MG capsule Take 3 mg by mouth 2 (two) times daily.    Historical Provider, MD  Vitamins-Lipotropics (LIPO-FLAVONOID PLUS PO) Take 1 tablet by mouth daily with breakfast.     Historical Provider, MD   BP 130/73 mmHg  Pulse 88  Temp(Src) 98 F (36.7 C) (Oral)  Resp 17  Ht  (1.626 m)  Wt 63.504 kg  BMI 24.02 kg/m2  SpO2 98%   Physical Exam  Constitutional: She appears well-developed and well-nourished. No distress.  HENT:  Head:  Normocephalic and atraumatic.  Eyes: Conjunctivae are normal. Pupils are equal, round, and reactive to light. Right eye exhibits no discharge. Left eye exhibits no discharge. No scleral icterus.  Neck: Normal range of motion.  Cardiovascular:  Pulses:      Dorsalis pedis pulses are 2+ on the right side, and 2+ on the left side.       Posterior tibial pulses are 2+ on the right side, and 2+ on the left side.  Pulmonary/Chest: Effort normal.  Musculoskeletal:  Right leg, ankle, foot: No open wound, erythema, edema. Patient groans when right leg and foot are moved. N/V intact.  Neurological: She is disoriented. GCS eye subscore is 4. GCS verbal subscore is 3. GCS motor subscore is 4.  Skin: Skin is warm and dry.  Psychiatric: She is withdrawn. Cognition and memory are impaired. She is noncommunicative.    ED Course  Procedures (including critical care time) Labs Review Labs Reviewed - No data to display  Imaging Review Dg Tibia/fibula Right  08/11/2015  CLINICAL DATA:  Right leg pain, initial encounter EXAM: RIGHT TIBIA AND FIBULA - 2 VIEW COMPARISON:  None. FINDINGS: Mild degenerative changes are noted about the knee joint. No acute fracture or dislocation is noted. Small calcaneal spurs are seen. IMPRESSION: No acute abnormality noted. Electronically Signed   By: Alcide Clever M.D.   On: 08/11/2015 07:35   Dg Ankle Complete Right  08/11/2015  CLINICAL DATA:  Right ankle pain, no known injury, initial encounter EXAM: RIGHT ANKLE - COMPLETE 3+ VIEW COMPARISON:  None. FINDINGS: There is no evidence of fracture, dislocation, or joint effusion. There is no evidence of arthropathy or other focal bone abnormality. Soft tissues are unremarkable. IMPRESSION: No acute abnormality noted. Electronically Signed   By: Alcide Clever M.D.   On: 08/11/2015 07:35   Dg Foot Complete Right  08/11/2015  CLINICAL DATA:  Foot pain, initial encounter EXAM: RIGHT FOOT COMPLETE - 3+ VIEW COMPARISON:  None. FINDINGS:  There is no evidence of fracture or dislocation. There is no evidence of arthropathy or other focal bone abnormality. Soft tissues are unremarkable. IMPRESSION: No acute abnormality noted. Electronically Signed   By: Alcide Clever M.D.   On: 08/11/2015 07:34   I have personally reviewed and evaluated these images and lab results as part of my medical decision-making.   EKG Interpretation None      MDM   Final diagnoses:  Leg pain, anterior, right   82 year old female with possible right leg pain. Unsure of the etiology of her symptoms. All vital signs are WNL and stable. She is resting comfortably in bed. Xrays are negative for acute fracture. DVT study negative for blood clot. She is N/V intact and minimally tender on exam. Shared visit with  Dr. Adela LankFloyd. Discussed results with daughters. Patient is NAD, non-toxic, with stable VS. Patient is informed of clinical course, understands medical decision making process, and agrees with plan. Opportunity for questions provided and all questions answered. Return precautions given.    Bethel BornKelly Marie Veleka Djordjevic, PA-C 08/11/15 1150  Melene Planan Floyd, DO 08/11/15 1200

## 2015-08-11 NOTE — Discharge Instructions (Signed)
Pain Without a Known Cause °WHAT IS PAIN WITHOUT A KNOWN CAUSE? °Pain can occur in any part of the body and can range from mild to severe. Sometimes no cause can be found for why you are having pain. Some types of pain that can occur without a known cause include:  °· Headache. °· Back pain. °· Abdominal pain. °· Neck pain. °HOW IS PAIN WITHOUT A KNOWN CAUSE DIAGNOSED?  °Your health care provider will try to find the cause of your pain. This may include: °· Physical exam. °· Medical history. °· Blood tests. °· Urine tests. °· X-rays. °If no cause is found, your health care provider may diagnose you with pain without a known cause.  °IS THERE TREATMENT FOR PAIN WITHOUT A CAUSE?  °Treatment depends on the kind of pain you have. Your health care provider may prescribe medicines to help relieve your pain.  °WHAT CAN I DO AT HOME FOR MY PAIN?  °· Take medicines only as directed by your health care provider. °· Stop any activities that cause pain. During periods of severe pain, bed rest may help. °· Try to reduce your stress with activities such as yoga or meditation. Talk to your health care provider for other stress-reducing activity recommendations. °· Exercise regularly, if approved by your health care provider. °· Eat a healthy diet that includes fruits and vegetables. This may improve pain. Talk to your health care provider if you have any questions about your diet. °WHAT IF MY PAIN DOES NOT GET BETTER?  °If you have a painful condition and no reason can be found for the pain or the pain gets worse, it is important to follow up with your health care provider. It may be necessary to repeat tests and look further for a possible cause.  °  °This information is not intended to replace advice given to you by your health care provider. Make sure you discuss any questions you have with your health care provider. °  °Document Released: 11/24/2000 Document Revised: 03/22/2014 Document Reviewed: 07/17/2013 °Elsevier  Interactive Patient Education ©2016 Elsevier Inc. ° °

## 2015-08-11 NOTE — ED Notes (Signed)
Bed: AV40WA15 Expected date:  Expected time:  Means of arrival:  Comments: 82 yo F Right leg pain

## 2015-08-11 NOTE — ED Notes (Signed)
Per EMS [facility] right leg pain that started at appx 6am this morning. Staff at facility stated that pt showed grimacing when moving right leg. Per EMS no deformity noted and PMS within normal limits.

## 2015-08-25 ENCOUNTER — Emergency Department (HOSPITAL_COMMUNITY)
Admission: EM | Admit: 2015-08-25 | Discharge: 2015-08-25 | Disposition: A | Discharge status: Home / Self Care | Payer: Medicare Other | Attending: Emergency Medicine | Admitting: Emergency Medicine

## 2015-08-25 ENCOUNTER — Encounter (HOSPITAL_COMMUNITY): Payer: Self-pay

## 2015-08-25 DIAGNOSIS — M79604 Pain in right leg: Secondary | ICD-10-CM | POA: Diagnosis not present

## 2015-08-25 DIAGNOSIS — E1122 Type 2 diabetes mellitus with diabetic chronic kidney disease: Secondary | ICD-10-CM | POA: Diagnosis not present

## 2015-08-25 DIAGNOSIS — R5383 Other fatigue: Secondary | ICD-10-CM | POA: Diagnosis present

## 2015-08-25 DIAGNOSIS — N189 Chronic kidney disease, unspecified: Secondary | ICD-10-CM | POA: Insufficient documentation

## 2015-08-25 DIAGNOSIS — N390 Urinary tract infection, site not specified: Secondary | ICD-10-CM | POA: Insufficient documentation

## 2015-08-25 DIAGNOSIS — F419 Anxiety disorder, unspecified: Secondary | ICD-10-CM | POA: Diagnosis not present

## 2015-08-25 LAB — BASIC METABOLIC PANEL
Anion gap: 8 (ref 5–15)
BUN: 18 mg/dL (ref 6–20)
CO2: 29 mmol/L (ref 22–32)
Calcium: 10.4 mg/dL — ABNORMAL HIGH (ref 8.9–10.3)
Chloride: 107 mmol/L (ref 101–111)
Creatinine, Ser: 0.77 mg/dL (ref 0.44–1.00)
GFR calc Af Amer: >60 mL/min (ref >60)
GFR calc non Af Amer: >60 mL/min (ref >60)
Glucose, Bld: 107 mg/dL — ABNORMAL HIGH (ref 65–99)
Potassium: 4 mmol/L (ref 3.5–5.1)
Sodium: 144 mmol/L (ref 135–145)

## 2015-08-25 LAB — URINE MICROSCOPIC-ADD ON

## 2015-08-25 LAB — CBC WITH DIFFERENTIAL/PLATELET
Basophils Absolute: 0 10*3/uL (ref 0.0–0.1)
Basophils Relative: 0 %
Eosinophils Absolute: 0.2 10*3/uL (ref 0.0–0.7)
Eosinophils Relative: 3 %
HCT: 42.9 % (ref 36.0–46.0)
Hemoglobin: 14.6 g/dL (ref 12.0–15.0)
Lymphocytes Relative: 34 %
Lymphs Abs: 2.1 10*3/uL (ref 0.7–4.0)
MCH: 32 pg (ref 26.0–34.0)
MCHC: 34 g/dL (ref 30.0–36.0)
MCV: 94.1 fL (ref 78.0–100.0)
Monocytes Absolute: 0.5 10*3/uL (ref 0.1–1.0)
Monocytes Relative: 8 %
Neutro Abs: 3.4 10*3/uL (ref 1.7–7.7)
Neutrophils Relative %: 55 %
Platelets: 303 10*3/uL (ref 150–400)
RBC: 4.56 MIL/uL (ref 3.87–5.11)
RDW: 13.2 % (ref 11.5–15.5)
WBC: 6.2 10*3/uL (ref 4.0–10.5)

## 2015-08-25 LAB — URINALYSIS, ROUTINE W REFLEX MICROSCOPIC
Bilirubin Urine: NEGATIVE
Glucose, UA: NEGATIVE mg/dL
Hgb urine dipstick: NEGATIVE
Ketones, ur: NEGATIVE mg/dL
Nitrite: POSITIVE — AB
Protein, ur: NEGATIVE mg/dL
Specific Gravity, Urine: 1.02 (ref 1.005–1.030)
pH: 7.5 (ref 5.0–8.0)

## 2015-08-25 MED ORDER — CEPHALEXIN 500 MG PO CAPS: 500.0000 mg | ORAL_CAPSULE | Freq: Two times a day (BID) | ORAL | Status: DC

## 2015-08-25 MED ORDER — SODIUM CHLORIDE 0.9 % IV BOLUS (SEPSIS)
1000.0000 mL | Freq: Once | INTRAVENOUS | Status: AC
  Administered 2015-08-25: 1000 mL via INTRAVENOUS

## 2015-08-25 MED ORDER — CEFTRIAXONE SODIUM 1 G IJ SOLR
1.0000 g | Freq: Once | INTRAMUSCULAR | Status: AC
  Administered 2015-08-25: 1 g via INTRAVENOUS
  Filled 2015-08-25: qty 10

## 2015-08-25 NOTE — Progress Notes (Signed)
Pt from [facility] with chs 9 ED visits and 0 admissions Last admission in 01/2015 Pt with frequent falls, leg pain or lacerations from falls  pcp samuel bowen  Notified ED SW of pt ED visit

## 2015-08-25 NOTE — ED Notes (Signed)
Per EMS, Pt, [facility], presents for increased fatigue/weakness x 1 day and R leg pain for unknown amount of time.  Facility staff reported to EMS that the Pt normally sits up in her wheelchair and began "slumping" yesterday.  Pt was seen previously for leg pain.  At baseline, Pt is nonverbal and does not follow commands.  EMS reports "she will look at you, if you call her name."

## 2015-08-25 NOTE — ED Provider Notes (Signed)
CSN: [id number]     Arrival date & time 08/25/15  1459 History   First MD Initiated Contact with Patient 08/25/15 1520     Chief Complaint  Patient presents with  . Fatigue  . Leg Pain    HPI   Level 5 caveatDue to dementia  82 year old female presents from memory care unit with reports of fatigue and leg pain. Staff notes that over the last day patient has had increased fatigue and weakness, and right leg pain. They note that she normally is able to sit up in the chair, but began having weakness and slumping down into her chair yesterday. She notes that she is at her baseline cognitively, is nonverbal and does not follow commands.   Family at bedside reporting a have not seen patient and I'm unaware of any changes.  I contacted the facility who reports same information that was given by EMS upon arrival.   Past Medical History  Diagnosis Date  . Dementia   . Chronic kidney disease   . Osteoporosis   . GERD (gastroesophageal reflux disease)   . Anxiety   . Diabetes type 2, controlled (HCC)    History reviewed. No pertinent past surgical history. History reviewed. No pertinent family history. Social History  Substance Use Topics  . Smoking status: Never Smoker   . Smokeless tobacco: None  . Alcohol Use: No   OB History    No data available     Review of Systems  All other systems reviewed and are negative.   Allergies  Review of patient's allergies indicates no known allergies.  Home Medications   Prior to Admission medications   Medication Sig Start Date End Date Taking? Authorizing Provider  acetaminophen (TYLENOL) 650 MG CR tablet Take 650 mg by mouth 3 (three) times daily.   Yes Historical Provider, MD  ALPRAZolam (XANAX) 0.25 MG tablet Take 1 tablet (0.25 mg total) by mouth 2 (two) times daily. 01/27/15  Yes Alison MurrayAlma M Devine, MD  amLODipine (NORVASC) 5 MG tablet Take 1 tablet (5 mg total) by mouth daily. 01/27/15  Yes Alison MurrayAlma M Devine, MD  divalproex (DEPAKOTE  SPRINKLE) 125 MG capsule Take 250 mg by mouth 3 (three) times daily. 8AM ,2PM , 8PM   Yes Historical Provider, MD  haloperidol (HALDOL) 0.5 MG tablet Take 1 tablet (0.5 mg total) by mouth at bedtime. 01/27/15  Yes Alison MurrayAlma M Devine, MD  Multiple Vitamin (DAILY VITE) TABS Take 1 tablet by mouth daily with breakfast.   Yes Historical Provider, MD  Nutritional Supplements (NUTRITIONAL DRINK) LIQD Take 1 Bottle by mouth 3 (three) times daily. *House Shake*   Yes Historical Provider, MD  rivastigmine (EXELON) 3 MG capsule Take 3 mg by mouth 2 (two) times daily.   Yes Historical Provider, MD  Skin Protectants, Misc. (MINERIN) CREA Apply 1 application topically 2 (two) times daily. Apply all over body twice daily   Yes Historical Provider, MD  Vitamins-Lipotropics (LIPO-FLAVONOID PLUS PO) Take 1 tablet by mouth daily with breakfast.    Yes Historical Provider, MD  acetaminophen (TYLENOL) 500 MG tablet Take 500 mg by mouth every 4 (four) hours as needed for mild pain, moderate pain, fever or headache.    Historical Provider, MD  alum & mag hydroxide-simeth (MAALOX/MYLANTA) 200-200-20 MG/5ML suspension Take 30 mLs by mouth every 6 (six) hours as needed for indigestion or heartburn.    Historical Provider, MD  cephALEXin (KEFLEX) 500 MG capsule Take 1 capsule (500 mg total) by mouth 2 (two)  times daily. 08/25/15   Eyvonne Mechanic, PA-C  guaifenesin (ROBITUSSIN) 100 MG/5ML syrup Take 200 mg by mouth every 6 (six) hours as needed for cough.    Historical Provider, MD  loperamide (IMODIUM) 2 MG capsule Take 2 mg by mouth as needed for diarrhea or loose stools ((not to exceed 8 doses in 24 hours)).     Historical Provider, MD  magnesium hydroxide (MILK OF MAGNESIA) 400 MG/5ML suspension Take 30 mLs by mouth at bedtime as needed for mild constipation or moderate constipation.    Historical Provider, MD  neomycin-bacitracin-polymyxin (NEOSPORIN) [phone] ointment Apply 1 application topically as needed (apply to minor skin  tears or abrasions, cover with band aid/gauze and tape. change as needed until healed).    Historical Provider, MD   BP 151/64 mmHg  Pulse 88  Temp(Src) 98.3 F (36.8 C) (Oral)  Resp 16  SpO2 94%   Physical Exam  Constitutional: She is oriented to person, place, and time. She appears well-developed and well-nourished.  HENT:  Head: Normocephalic and atraumatic.  Eyes: Conjunctivae are normal. Pupils are equal, round, and reactive to light. Right eye exhibits no discharge. Left eye exhibits no discharge. No scleral icterus.  Neck: Normal range of motion. No JVD present. No tracheal deviation present.  Pulmonary/Chest: Effort normal. No stridor.  Musculoskeletal:  Bilateral lower extremities atraumatic, no redness, warmth to touch, no tenderness to palpation, and pedal pulses are 2+ intact  Neurological: She is alert and oriented to person, place, and time. Coordination normal.  Psychiatric: She has a normal mood and affect. Her behavior is normal. Judgment and thought content normal.  Nursing note and vitals reviewed.   ED Course  Procedures (including critical care time) Labs Review Labs Reviewed  BASIC METABOLIC PANEL - Abnormal; Notable for the following:    Glucose, Bld 107 (*)    Calcium 10.4 (*)    All other components within normal limits  URINALYSIS, ROUTINE W REFLEX MICROSCOPIC (NOT AT [facility]) - Abnormal; Notable for the following:    APPearance CLOUDY (*)    Nitrite POSITIVE (*)    Leukocytes, UA MODERATE (*)    All other components within normal limits  URINE MICROSCOPIC-ADD ON - Abnormal; Notable for the following:    Squamous Epithelial / LPF 0-5 (*)    Bacteria, UA MANY (*)    All other components within normal limits  URINE CULTURE  CBC WITH DIFFERENTIAL/PLATELET    Imaging Review No results found. I have personally reviewed and evaluated these images and lab results as part of my medical decision-making.   EKG Interpretation None      MDM   Final  diagnoses:  UTI (lower urinary tract infection)    Labs: Urinalysis, BMP, CBC  Imaging:  Consults:  Therapeutics:  Discharge Meds: Keflex  Assessment/Plan:Patient presents for fatigue, most likely related to urinary tract infection. She is afebrile, has no significant laboratory abnormalities. Patient had significant workup for her leg pain on 08/11/2015 with x-rays, DVT study.   Patient will be discharged home to nursing home facility, family encouraged to monitor her for any changes, return immediately if any concerning signs or symptoms present.        Eyvonne Mechanic, PA-C 08/26/15 0130  Nelva Nay, MD 08/27/15 [phone]

## 2015-08-25 NOTE — Discharge Instructions (Signed)
Patient has been diagnosed with urinary tract infection. This is likely the cause of her fatigue. She has no fever, appears to be in no acute distress. Please call to monitor this patient, if she has any acute changes from baseline please have her return to the emergency room for further evaluation. Please give antibiotics for full course.

## 2015-08-28 LAB — URINE CULTURE
Culture: >=100000 — AB
Special Requests: NORMAL

## 2015-08-29 ENCOUNTER — Telehealth (HOSPITAL_BASED_OUTPATIENT_CLINIC_OR_DEPARTMENT_OTHER): Payer: Self-pay

## 2015-08-29 NOTE — Telephone Encounter (Signed)
Post ED Visit - Positive Culture Follow-up  Culture report reviewed by antimicrobial stewardship pharmacist:  []  Enzo BiNathan Batchelder, Pharm.D. []  Celedonio MiyamotoJeremy Frens, Pharm.D., BCPS []  Garvin FilaMike Maccia, Pharm.D. []  Georgina PillionElizabeth Martin, Pharm.D., BCPS [x]  [place] EastMinh Pham, [address] BoulevardPharm.D., BCPS, AAHIVP []  Estella HuskMichelle Turner, Pharm.D., BCPS, AAHIVP []  Tennis Mustassie Stewart, Pharm.D. []  Sherle Poeob Vincent, [place]D.  Positive urine culture Treated with Cephalexin, organism sensitive to the same and no further patient follow-up is required at this time.  Jerry CarasCullom, Mahima Hottle Burnett 08/29/2015, 10:39 AM

## 2015-09-11 ENCOUNTER — Emergency Department (HOSPITAL_COMMUNITY)
Admission: EM | Admit: 2015-09-11 | Discharge: 2015-09-12 | Disposition: A | Discharge status: Home / Self Care | Payer: Medicare Other | Attending: Emergency Medicine | Admitting: Emergency Medicine

## 2015-09-11 DIAGNOSIS — Z79899 Other long term (current) drug therapy: Secondary | ICD-10-CM | POA: Insufficient documentation

## 2015-09-11 DIAGNOSIS — R109 Unspecified abdominal pain: Secondary | ICD-10-CM | POA: Diagnosis present

## 2015-09-11 DIAGNOSIS — N189 Chronic kidney disease, unspecified: Secondary | ICD-10-CM | POA: Insufficient documentation

## 2015-09-11 DIAGNOSIS — E1122 Type 2 diabetes mellitus with diabetic chronic kidney disease: Secondary | ICD-10-CM | POA: Diagnosis not present

## 2015-09-11 DIAGNOSIS — R1111 Vomiting without nausea: Secondary | ICD-10-CM

## 2015-09-11 DIAGNOSIS — M810 Age-related osteoporosis without current pathological fracture: Secondary | ICD-10-CM | POA: Insufficient documentation

## 2015-09-11 NOTE — ED Notes (Signed)
Patient is DNR. Patient was sent due to facility said they found patient holding her stomach and some vomit on the pillow

## 2015-09-11 NOTE — ED Notes (Signed)
Bed: WA09 Expected date:  Expected time:  Means of arrival:  Comments: EMS abd pain / vomiting from SNF

## 2015-09-12 ENCOUNTER — Encounter (HOSPITAL_COMMUNITY): Payer: Self-pay | Admitting: Emergency Medicine

## 2015-09-12 ENCOUNTER — Emergency Department (HOSPITAL_COMMUNITY): Payer: Medicare Other

## 2015-09-12 DIAGNOSIS — R1111 Vomiting without nausea: Secondary | ICD-10-CM | POA: Diagnosis not present

## 2015-09-12 LAB — URINALYSIS, ROUTINE W REFLEX MICROSCOPIC
Bilirubin Urine: NEGATIVE
GLUCOSE, UA: NEGATIVE mg/dL
Hgb urine dipstick: NEGATIVE
Ketones, ur: NEGATIVE mg/dL
LEUKOCYTES UA: NEGATIVE
Nitrite: NEGATIVE
PH: 6.5 (ref 5.0–8.0)
PROTEIN: NEGATIVE mg/dL
SPECIFIC GRAVITY, URINE: 1.021 (ref 1.005–1.030)

## 2015-09-12 LAB — COMPREHENSIVE METABOLIC PANEL
ALK PHOS: 72 U/L (ref 38–126)
ALT: 17 U/L (ref 14–54)
AST: 23 U/L (ref 15–41)
Albumin: 3.5 g/dL (ref 3.5–5.0)
Anion gap: 6 (ref 5–15)
BUN: 17 mg/dL (ref 6–20)
CHLORIDE: 107 mmol/L (ref 101–111)
CO2: 31 mmol/L (ref 22–32)
Calcium: 9.8 mg/dL (ref 8.9–10.3)
Creatinine, Ser: 0.7 mg/dL (ref 0.44–1.00)
GFR calc non Af Amer: >60 mL/min (ref >60)
Glucose, Bld: 133 mg/dL — ABNORMAL HIGH (ref 65–99)
Potassium: 3.8 mmol/L (ref 3.5–5.1)
SODIUM: 144 mmol/L (ref 135–145)
Total Bilirubin: 0.4 mg/dL (ref 0.3–1.2)
Total Protein: 6.4 g/dL — ABNORMAL LOW (ref 6.5–8.1)

## 2015-09-12 LAB — CBC
HEMATOCRIT: 37.7 % (ref 36.0–46.0)
HEMOGLOBIN: 12.7 g/dL (ref 12.0–15.0)
MCH: 31.6 pg (ref 26.0–34.0)
MCHC: 33.7 g/dL (ref 30.0–36.0)
MCV: 93.8 fL (ref 78.0–100.0)
Platelets: 255 10*3/uL (ref 150–400)
RBC: 4.02 MIL/uL (ref 3.87–5.11)
RDW: 12.9 % (ref 11.5–15.5)
WBC: 4.6 10*3/uL (ref 4.0–10.5)

## 2015-09-12 LAB — LIPASE, BLOOD: Lipase: 104 U/L — ABNORMAL HIGH (ref 11–51)

## 2015-09-12 MED ORDER — ONDANSETRON HCL 4 MG/2ML IJ SOLN
4.0000 mg | Freq: Once | INTRAMUSCULAR | Status: AC
  Administered 2015-09-12: 4 mg via INTRAVENOUS
  Filled 2015-09-12: qty 2

## 2015-09-12 NOTE — Discharge Instructions (Signed)

## 2015-09-12 NOTE — ED Notes (Signed)
Tried getting an EKG Pt resisted Rn Johnathon Made aware

## 2015-09-12 NOTE — ED Notes (Signed)
Pt comes to ed from facility, workers noticed her holding her stomach and vomit on her pillow. Pt is non verbal alert but any additional information is not known at this time.

## 2015-09-12 NOTE — ED Notes (Signed)
In xray

## 2015-09-12 NOTE — ED Provider Notes (Signed)
CSN: [id number]     Arrival date & time 09/11/15  2351 History  By signing my name below, I, Pamela Ramirez, attest that this documentation has been prepared under the direction and in the presence of Pamela Stahle, MD.  Electronically Signed: Rosario AdieWilliam Andrew Ramirez, ED Scribe. 09/12/2015. 1:42 AM.   Chief Complaint  Patient presents with  . Abdominal Pain   Patient is a 82 y.o. female presenting with vomiting. The history is provided by the EMS personnel. The history is limited by the condition of the patient. No language interpreter was used.  Emesis Severity:  Mild Timing:  Rare Progression:  Resolved Chronicity:  New Relieved by:  Nothing Worsened by:  Nothing tried Ineffective treatments:  None tried Associated symptoms: abdominal pain   Associated symptoms: no diarrhea   Risk factors: no alcohol use    LEVEL 5 CAVEAT: HPI and ROS limited due to nonverbal and dementia  HPI Comments: Owens SharkBeatrice D Ramirez is a 82 y.o. female who presents to the Emergency Department complaining of  abdominal pain and vomiting onset PTA. Per caregivers from her facility, pt was found holding her abdomen and they found vomit on pillow which prompted them to bring her into the ED. Pt is currently nonverbal.   Past Medical History  Diagnosis Date  . Dementia   . Chronic kidney disease   . Osteoporosis   . GERD (gastroesophageal reflux disease)   . Anxiety   . Diabetes type 2, controlled (HCC)    History reviewed. No pertinent past surgical history. No family history on file. Social History  Substance Use Topics  . Smoking status: Never Smoker   . Smokeless tobacco: None  . Alcohol Use: No   OB History    No data available     Review of Systems  Unable to perform ROS: Dementia  Gastrointestinal: Positive for vomiting and abdominal pain. Negative for diarrhea.   Allergies  Review of patient's allergies indicates no known allergies.  Home Medications   Prior to Admission  medications   Medication Sig Start Date End Date Taking? Authorizing Provider  acetaminophen (TYLENOL) 500 MG tablet Take 500 mg by mouth every 4 (four) hours as needed for mild pain, moderate pain, fever or headache.   Yes Historical Provider, MD  acetaminophen (TYLENOL) 650 MG CR tablet Take 650 mg by mouth 3 (three) times daily.   Yes Historical Provider, MD  ALPRAZolam (XANAX) 0.25 MG tablet Take 1 tablet (0.25 mg total) by mouth 2 (two) times daily. 01/27/15  Yes Alison MurrayAlma M Devine, MD  alum & mag hydroxide-simeth (MAALOX/MYLANTA) 200-200-20 MG/5ML suspension Take 30 mLs by mouth every 6 (six) hours as needed for indigestion or heartburn.   Yes Historical Provider, MD  amLODipine (NORVASC) 5 MG tablet Take 1 tablet (5 mg total) by mouth daily. 01/27/15  Yes Alison MurrayAlma M Devine, MD  divalproex (DEPAKOTE SPRINKLE) 125 MG capsule Take 250 mg by mouth 3 (three) times daily. 8AM ,2PM , 8PM   Yes Historical Provider, MD  guaifenesin (ROBITUSSIN) 100 MG/5ML syrup Take 200 mg by mouth every 6 (six) hours as needed for cough.   Yes Historical Provider, MD  haloperidol (HALDOL) 0.5 MG tablet Take 1 tablet (0.5 mg total) by mouth at bedtime. 01/27/15  Yes Alison MurrayAlma M Devine, MD  loperamide (IMODIUM) 2 MG capsule Take 2 mg by mouth as needed for diarrhea or loose stools ((not to exceed 8 doses in 24 hours)).    Yes Historical Provider, MD  magnesium hydroxide (  MILK OF MAGNESIA) 400 MG/5ML suspension Take 30 mLs by mouth at bedtime as needed for mild constipation or moderate constipation.   Yes Historical Provider, MD  Multiple Vitamin (DAILY VITE) TABS Take 1 tablet by mouth daily with breakfast.   Yes Historical Provider, MD  neomycin-bacitracin-polymyxin (NEOSPORIN) [phone] ointment Apply 1 application topically as needed (apply to minor skin tears or abrasions, cover with band aid/gauze and tape. change as needed until healed).   Yes Historical Provider, MD  Nutritional Supplements (NUTRITIONAL DRINK) LIQD Take 1  Bottle by mouth 3 (three) times daily. *House Shake*   Yes Historical Provider, MD  rivastigmine (EXELON) 3 MG capsule Take 3 mg by mouth 2 (two) times daily.   Yes Historical Provider, MD  Skin Protectants, Misc. (MINERIN) CREA Apply 1 application topically 2 (two) times daily. Apply all over body twice daily   Yes Historical Provider, MD  Vitamins-Lipotropics (LIPO-FLAVONOID PLUS PO) Take 1 tablet by mouth daily with breakfast.    Yes Historical Provider, MD  cephALEXin (KEFLEX) 500 MG capsule Take 1 capsule (500 mg total) by mouth 2 (two) times daily. Patient not taking: Reported on 09/12/2015 08/25/15   Eyvonne Mechanic, PA-C   BP 130/72 mmHg  Pulse 81  Temp(Src) 98.4 F (36.9 C) (Oral)  Resp 16  SpO2 100%   Physical Exam  Constitutional: She appears well-developed and well-nourished.  HENT:  Head: Normocephalic and atraumatic.  Eyes: Conjunctivae and EOM are normal. Pupils are equal, round, and reactive to light.  Neck: Trachea normal and normal range of motion. Neck supple. No JVD present. Carotid bruit is not present. No tracheal deviation present.  Cardiovascular: Normal rate, regular rhythm and normal heart sounds.  Exam reveals no gallop and no friction rub.   No murmur heard. Pulmonary/Chest: Effort normal and breath sounds normal. No stridor. She has no wheezes. She has no rales.  Abdominal: Soft. There is no tenderness. There is no rebound and no guarding.  Pt has hyperactive bowel sounds.  Musculoskeletal: Normal range of motion.  Lymphadenopathy:    She has no cervical adenopathy.  Neurological: She is alert. She has normal reflexes.  Skin: Skin is warm and dry. She is not diaphoretic.  Psychiatric: She has a normal mood and affect. Her behavior is normal.  Nursing note and vitals reviewed.  ED Course  Procedures (including critical care time)  DIAGNOSTIC STUDIES: Oxygen Saturation is 100% on RA, normal by my interpretation.   COORDINATION OF CARE: 12:48  AM-Discussed next steps with pt including Lipase, CMP, CBC, DG Abdominal, and UA.  Labs Review Labs Reviewed  LIPASE, BLOOD - Abnormal; Notable for the following:    Lipase 104 (*)    All other components within normal limits  COMPREHENSIVE METABOLIC PANEL - Abnormal; Notable for the following:    Glucose, Bld 133 (*)    Total Protein 6.4 (*)    All other components within normal limits  CBC  URINALYSIS, ROUTINE W REFLEX MICROSCOPIC (NOT AT [facility])    Imaging Review Dg Abd Acute W/chest  09/12/2015  CLINICAL DATA:  Vomiting.  Abdominal pain. EXAM: DG ABDOMEN ACUTE W/ 1V CHEST COMPARISON:  04/13/2015 FINDINGS: The abdominal gas pattern is negative for obstruction or perforation. No biliary or urinary calculi are evident. The AP supine view of the chest is negative for acute cardiopulmonary abnormality. IMPRESSION: Negative abdominal radiographs.  No acute cardiopulmonary disease. Electronically Signed   By: Ellery Plunk M.D.   On: 09/12/2015 02:24    I have personally  reviewed and evaluated these images and lab results as part of my medical decision-making.    MDM   Final diagnoses:  None   Filed Vitals:   09/12/15 0512 09/12/15 0525  BP: 143/76 143/76  Pulse: 77 75  Temp:  98.5 F (36.9 C)  Resp: 17 16   Results for orders placed or performed during the hospital encounter of 09/11/15  Lipase, blood  Result Value Ref Range   Lipase 104 (H) 11 - 51 U/L  Comprehensive metabolic panel  Result Value Ref Range   Sodium 144 135 - 145 mmol/L   Potassium 3.8 3.5 - 5.1 mmol/L   Chloride 107 101 - 111 mmol/L   CO2 31 22 - 32 mmol/L   Glucose, Bld 133 (H) 65 - 99 mg/dL   BUN 17 6 - 20 mg/dL   Creatinine, Ser 1.610.70 0.44 - 1.00 mg/dL   Calcium 9.8 8.9 - 09.610.3 mg/dL   Total Protein 6.4 (L) 6.5 - 8.1 g/dL   Albumin 3.5 3.5 - 5.0 g/dL   AST 23 15 - 41 U/L   ALT 17 14 - 54 U/L   Alkaline Phosphatase 72 38 - 126 U/L   Total Bilirubin 0.4 0.3 - 1.2 mg/dL   GFR calc non Af Amer  >60 >60 mL/min   GFR calc Af Amer >60 >60 mL/min   Anion gap 6 5 - 15  CBC  Result Value Ref Range   WBC 4.6 4.0 - 10.5 K/uL   RBC 4.02 3.87 - 5.11 MIL/uL   Hemoglobin 12.7 12.0 - 15.0 g/dL   HCT 04.537.7 40.936.0 - 81.146.0 %   MCV 93.8 78.0 - 100.0 fL   MCH 31.6 26.0 - 34.0 pg   MCHC 33.7 30.0 - 36.0 g/dL   RDW 91.412.9 78.211.5 - 95.615.5 %   Platelets 255 150 - 400 K/uL  Urinalysis, Routine w reflex microscopic  Result Value Ref Range   Color, Urine YELLOW YELLOW   APPearance CLEAR CLEAR   Specific Gravity, Urine 1.021 1.005 - 1.030   pH 6.5 5.0 - 8.0   Glucose, UA NEGATIVE NEGATIVE mg/dL   Hgb urine dipstick NEGATIVE NEGATIVE   Bilirubin Urine NEGATIVE NEGATIVE   Ketones, ur NEGATIVE NEGATIVE mg/dL   Protein, ur NEGATIVE NEGATIVE mg/dL   Nitrite NEGATIVE NEGATIVE   Leukocytes, UA NEGATIVE NEGATIVE   Dg Abd Acute W/chest  09/12/2015  CLINICAL DATA:  Vomiting.  Abdominal pain. EXAM: DG ABDOMEN ACUTE W/ 1V CHEST COMPARISON:  04/13/2015 FINDINGS: The abdominal gas pattern is negative for obstruction or perforation. No biliary or urinary calculi are evident. The AP supine view of the chest is negative for acute cardiopulmonary abnormality. IMPRESSION: Negative abdominal radiographs.  No acute cardiopulmonary disease. Electronically Signed   By: Ellery Plunkaniel R Mitchell M.D.   On: 09/12/2015 02:24    Medications  ondansetron (ZOFRAN) injection 4 mg (4 mg Intravenous Given 09/12/15 0235)    Well appearing normal labs, imaging and exam.  Stable for discharge with close follow up.  Strict return precautions  I personally performed the services described in this documentation, which was scribed in my presence. The recorded information has been reviewed and is accurate.     Cy BlamerApril Selita Staiger, MD 09/12/15 [phone]

## 2015-10-06 ENCOUNTER — Emergency Department (HOSPITAL_COMMUNITY)
Admission: EM | Admit: 2015-10-06 | Discharge: 2015-10-06 | Disposition: A | Discharge status: Home / Self Care | Payer: Medicare Other | Attending: Emergency Medicine | Admitting: Emergency Medicine

## 2015-10-06 ENCOUNTER — Emergency Department (HOSPITAL_COMMUNITY): Payer: Medicare Other

## 2015-10-06 ENCOUNTER — Encounter (HOSPITAL_COMMUNITY): Payer: Self-pay | Admitting: Emergency Medicine

## 2015-10-06 DIAGNOSIS — Y92128 Other place in nursing home as the place of occurrence of the external cause: Secondary | ICD-10-CM | POA: Diagnosis not present

## 2015-10-06 DIAGNOSIS — W050XXA Fall from non-moving wheelchair, initial encounter: Secondary | ICD-10-CM | POA: Insufficient documentation

## 2015-10-06 DIAGNOSIS — E1122 Type 2 diabetes mellitus with diabetic chronic kidney disease: Secondary | ICD-10-CM | POA: Diagnosis not present

## 2015-10-06 DIAGNOSIS — Y939 Activity, unspecified: Secondary | ICD-10-CM | POA: Diagnosis not present

## 2015-10-06 DIAGNOSIS — S0990XA Unspecified injury of head, initial encounter: Secondary | ICD-10-CM | POA: Diagnosis not present

## 2015-10-06 DIAGNOSIS — Z79899 Other long term (current) drug therapy: Secondary | ICD-10-CM | POA: Insufficient documentation

## 2015-10-06 DIAGNOSIS — Z043 Encounter for examination and observation following other accident: Secondary | ICD-10-CM | POA: Insufficient documentation

## 2015-10-06 DIAGNOSIS — N189 Chronic kidney disease, unspecified: Secondary | ICD-10-CM | POA: Diagnosis not present

## 2015-10-06 DIAGNOSIS — Y999 Unspecified external cause status: Secondary | ICD-10-CM | POA: Diagnosis not present

## 2015-10-06 DIAGNOSIS — W19XXXA Unspecified fall, initial encounter: Secondary | ICD-10-CM

## 2015-10-06 NOTE — ED Notes (Signed)
RN contacted [facility] for transport 

## 2015-10-06 NOTE — ED Notes (Signed)
Bed: WHALD Expected date:  Expected time:  Means of arrival:  Comments: 

## 2015-10-06 NOTE — ED Triage Notes (Signed)
Pt is from [facility].  Observed fall from Wheelchair striking head (left temple).  Pt is at baseline now per facility which is confused.  Pt does not c/o pain and has no signs of injury.  CBG 84

## 2015-10-06 NOTE — ED Notes (Signed)
Bed: UY40 Expected date:  Expected time:  Means of arrival:  Comments: EMS 82 yo fall

## 2015-10-06 NOTE — Progress Notes (Signed)
Pt from [facility] with chs 10 ED visits and 0 admissions Last admission in 01/2015 Pt with frequent falls, leg pain or lacerations from falls  pcp samuel bowen  PMH Dementia  Not eligible for [facility] referral

## 2015-10-06 NOTE — ED Provider Notes (Signed)
[facility]-EMERGENCY DEPT Provider Note   CSN: [id number] Arrival date & time: 10/06/15  1250  First Provider Contact:  1:54 PM        History   Chief Complaint Chief Complaint  Patient presents with  . Fall    HPI Pamela Ramirez is a 82 y.o. female who had a witnessed fall from her wheel chair today at her memory care facility. There is a Level 5 caveat due to dementia. She is hit her head when she fell. She is at her baseline per staff at the nursing home. No other complaints or injuries noted.   Fall  This is a new problem. The current episode started today.    Past Medical History:  Diagnosis Date  . Anxiety   . Chronic kidney disease   . Dementia   . Diabetes type 2, controlled (HCC)   . GERD (gastroesophageal reflux disease)   . Osteoporosis     Patient Active Problem List   Diagnosis Date Noted  . Acute encephalopathy 01/26/2015  . Dyslipidemia 01/26/2015  . CKD (chronic kidney disease) stage 3, GFR 30-59 ml/min 01/26/2015  . Anemia of chronic kidney failure 01/26/2015  . Sepsis (HCC) 01/25/2015  . Pulmonary embolism (HCC) 05/12/2011  . Dementia 05/12/2011    History reviewed. No pertinent surgical history.  OB History    No data available       Home Medications    Prior to Admission medications   Medication Sig Start Date End Date Taking? Authorizing Provider  acetaminophen (TYLENOL) 500 MG tablet Take 500 mg by mouth every 4 (four) hours as needed for mild pain, moderate pain, fever or headache.   Yes Historical Provider, MD  acetaminophen (TYLENOL) 650 MG CR tablet Take 650 mg by mouth 3 (three) times daily.   Yes Historical Provider, MD  ALPRAZolam (XANAX) 0.25 MG tablet Take 1 tablet (0.25 mg total) by mouth 2 (two) times daily. 01/27/15  Yes Alison Murray, MD  alum & mag hydroxide-simeth (MAALOX/MYLANTA) 200-200-20 MG/5ML suspension Take 30 mLs by mouth every 6 (six) hours as needed for indigestion or heartburn.   Yes Historical Provider, MD    amLODipine (NORVASC) 5 MG tablet Take 1 tablet (5 mg total) by mouth daily. 01/27/15  Yes Alison Murray, MD  divalproex (DEPAKOTE SPRINKLE) 125 MG capsule Take 250 mg by mouth 3 (three) times daily. 8AM ,2PM , 8PM   Yes Historical Provider, MD  guaifenesin (ROBITUSSIN) 100 MG/5ML syrup Take 200 mg by mouth every 6 (six) hours as needed for cough.   Yes Historical Provider, MD  haloperidol (HALDOL) 0.5 MG tablet Take 1 tablet (0.5 mg total) by mouth at bedtime. 01/27/15  Yes Alison Murray, MD  loperamide (IMODIUM) 2 MG capsule Take 2 mg by mouth as needed for diarrhea or loose stools ((not to exceed 8 doses in 24 hours)).    Yes Historical Provider, MD  magnesium hydroxide (MILK OF MAGNESIA) 400 MG/5ML suspension Take 30 mLs by mouth at bedtime as needed for mild constipation or moderate constipation.   Yes Historical Provider, MD  Multiple Vitamin (DAILY VITE) TABS Take 1 tablet by mouth daily with breakfast.   Yes Historical Provider, MD  neomycin-bacitracin-polymyxin (NEOSPORIN) [phone] ointment Apply 1 application topically as needed (apply to minor skin tears or abrasions, cover with band aid/gauze and tape. change as needed until healed).   Yes Historical Provider, MD  Nutritional Supplements (NUTRITIONAL DRINK) LIQD Take 1 Bottle by mouth 3 (three) times daily. *House  Shake*   Yes Historical Provider, MD  rivastigmine (EXELON) 3 MG capsule Take 3 mg by mouth 2 (two) times daily.   Yes Historical Provider, MD  Skin Protectants, Misc. (MINERIN) CREA Apply 1 application topically 2 (two) times daily. Apply all over body twice daily   Yes Historical Provider, MD  Vitamins-Lipotropics (LIPO-FLAVONOID PLUS PO) Take 1 tablet by mouth daily with breakfast.    Yes Historical Provider, MD  cephALEXin (KEFLEX) 500 MG capsule Take 1 capsule (500 mg total) by mouth 2 (two) times daily. Patient not taking: Reported on 09/12/2015 08/25/15   Eyvonne Mechanic, PA-C    Family History History reviewed. No  pertinent family history.  Social History Social History  Substance Use Topics  . Smoking status: Never Smoker  . Smokeless tobacco: Not on file  . Alcohol use No     Allergies   Review of patient's allergies indicates no known allergies.   Review of Systems Review of Systems  Unable to perform ROS: Dementia     Physical Exam Updated Vital Signs BP (!) 121/46 (BP Location: Left Arm)   Pulse 74   Temp 97.1 F (36.2 C) (Axillary)   Resp 15   SpO2 99%   Physical Exam  Constitutional: She appears well-developed and well-nourished. No distress.  HENT:  Head: Normocephalic and atraumatic.  Eyes: Conjunctivae are normal. No scleral icterus.  Neck: Normal range of motion.  Cardiovascular: Normal rate, regular rhythm and normal heart sounds.  Exam reveals no gallop and no friction rub.   No murmur heard. Pulmonary/Chest: Effort normal and breath sounds normal. No respiratory distress.  Abdominal: Soft. Bowel sounds are normal. She exhibits no distension and no mass. There is no tenderness. There is no guarding.  Neurological: She is alert.  Skin: Skin is warm and dry. She is not diaphoretic.  Nursing note and vitals reviewed.    ED Treatments / Results  Labs (all labs ordered are listed, but only abnormal results are displayed) Labs Reviewed - No data to display  EKG  EKG Interpretation None       Radiology Ct Head Wo Contrast  Result Date: 10/06/2015 CLINICAL DATA:  Pt is from [facility]. Observed fall from Wheelchair striking head (left temple). Pt is at baseline now per facility which is confused. Pt does not c/o pain and has no signs of injury. Pt info from [facility] notes; pt is confused and unable to answer questions or follow commands EXAM: CT HEAD WITHOUT CONTRAST CT CERVICAL SPINE WITHOUT CONTRAST TECHNIQUE: Multidetector CT imaging of the head and cervical spine was performed following the standard protocol without intravenous contrast. Multiplanar CT  image reconstructions of the cervical spine were also generated. COMPARISON:  06/10/2015 FINDINGS: CT HEAD FINDINGS The ventricles are enlarged consistent with moderate to advanced atrophy. This is stable from the prior head CT. No parenchymal masses or mass effect. There is no evidence of a cortical infarct. Patchy periventricular white matter hypoattenuation is noted consistent with moderate chronic microvascular ischemic change. There are no extra-axial masses or abnormal fluid collections. There is no intracranial hemorrhage. No skull fracture. There is opacified right anterior ethmoid sinus. Remaining sinuses are clear. CT CERVICAL SPINE FINDINGS No fracture.  No spondylolisthesis. Mild loss of disc height at C2-C3. Moderate to marked loss of disc height at C3-C4. Mild loss of disc height at C5-C6 with moderate loss of disc height at C6-C7. There are endplate osteophytes at these levels. Uncovertebral spurring leads to moderate neural foraminal narrowing bilaterally at  C3-C4. Bones are demineralized. Soft tissues demonstrate carotid vascular calcifications but are otherwise unremarkable. IMPRESSION: HEAD CT: No acute intracranial abnormalities. No skull fracture. No change from the prior head CT. CERVICAL CT:  No fracture or acute finding. Electronically Signed   By: Amie Portland M.D.   On: 10/06/2015 15:00  Ct Cervical Spine Wo Contrast  Result Date: 10/06/2015 CLINICAL DATA:  Pt is from [facility]. Observed fall from Wheelchair striking head (left temple). Pt is at baseline now per facility which is confused. Pt does not c/o pain and has no signs of injury. Pt info from [facility] notes; pt is confused and unable to answer questions or follow commands EXAM: CT HEAD WITHOUT CONTRAST CT CERVICAL SPINE WITHOUT CONTRAST TECHNIQUE: Multidetector CT imaging of the head and cervical spine was performed following the standard protocol without intravenous contrast. Multiplanar CT image reconstructions of the  cervical spine were also generated. COMPARISON:  06/10/2015 FINDINGS: CT HEAD FINDINGS The ventricles are enlarged consistent with moderate to advanced atrophy. This is stable from the prior head CT. No parenchymal masses or mass effect. There is no evidence of a cortical infarct. Patchy periventricular white matter hypoattenuation is noted consistent with moderate chronic microvascular ischemic change. There are no extra-axial masses or abnormal fluid collections. There is no intracranial hemorrhage. No skull fracture. There is opacified right anterior ethmoid sinus. Remaining sinuses are clear. CT CERVICAL SPINE FINDINGS No fracture.  No spondylolisthesis. Mild loss of disc height at C2-C3. Moderate to marked loss of disc height at C3-C4. Mild loss of disc height at C5-C6 with moderate loss of disc height at C6-C7. There are endplate osteophytes at these levels. Uncovertebral spurring leads to moderate neural foraminal narrowing bilaterally at C3-C4. Bones are demineralized. Soft tissues demonstrate carotid vascular calcifications but are otherwise unremarkable. IMPRESSION: HEAD CT: No acute intracranial abnormalities. No skull fracture. No change from the prior head CT. CERVICAL CT:  No fracture or acute finding. Electronically Signed   By: Amie Portland M.D.   On: 10/06/2015 15:00   Procedures Procedures (including critical care time)  Medications Ordered in ED Medications - No data to display   Initial Impression / Assessment and Plan / ED Course  I have reviewed the triage vital signs and the nursing notes.  Pertinent labs & imaging results that were available during my care of the patient were reviewed by me and considered in my medical decision making (see chart for details).  Clinical Course  Value Comment By Time  CT Head Wo Contrast Imaging of head and neck negative. Elderly patient with dementia. Fall at nursing home. At baseline mental status. No signs of injury on PE. No blood  thinners. Negative labs and imaging. Appears safe to return to [facility]  Arthor Captain, PA-C 07/24 1526      Final Clinical Impressions(s) / ED Diagnoses   Final diagnoses:  Fall, initial encounter    New Prescriptions New Prescriptions   No medications on file     Arthor Captain, PA-C 10/06/15 1527    Raeford Razor, MD 10/16/15 2212

## 2015-10-08 ENCOUNTER — Emergency Department (HOSPITAL_COMMUNITY)
Admission: EM | Admit: 2015-10-08 | Discharge: 2015-10-09 | Disposition: A | Discharge status: Home / Self Care | Payer: Medicare Other | Attending: Emergency Medicine | Admitting: Emergency Medicine

## 2015-10-08 ENCOUNTER — Encounter (HOSPITAL_COMMUNITY): Payer: Self-pay | Admitting: Emergency Medicine

## 2015-10-08 ENCOUNTER — Emergency Department (HOSPITAL_COMMUNITY): Payer: Medicare Other

## 2015-10-08 DIAGNOSIS — N390 Urinary tract infection, site not specified: Secondary | ICD-10-CM

## 2015-10-08 DIAGNOSIS — E1122 Type 2 diabetes mellitus with diabetic chronic kidney disease: Secondary | ICD-10-CM | POA: Insufficient documentation

## 2015-10-08 DIAGNOSIS — Z79899 Other long term (current) drug therapy: Secondary | ICD-10-CM | POA: Diagnosis not present

## 2015-10-08 DIAGNOSIS — Z86718 Personal history of other venous thrombosis and embolism: Secondary | ICD-10-CM | POA: Diagnosis not present

## 2015-10-08 DIAGNOSIS — Y999 Unspecified external cause status: Secondary | ICD-10-CM | POA: Diagnosis not present

## 2015-10-08 DIAGNOSIS — F0390 Unspecified dementia without behavioral disturbance: Secondary | ICD-10-CM | POA: Diagnosis present

## 2015-10-08 DIAGNOSIS — Z792 Long term (current) use of antibiotics: Secondary | ICD-10-CM | POA: Insufficient documentation

## 2015-10-08 DIAGNOSIS — W19XXXA Unspecified fall, initial encounter: Secondary | ICD-10-CM | POA: Diagnosis not present

## 2015-10-08 DIAGNOSIS — Y939 Activity, unspecified: Secondary | ICD-10-CM | POA: Diagnosis not present

## 2015-10-08 DIAGNOSIS — Y929 Unspecified place or not applicable: Secondary | ICD-10-CM | POA: Diagnosis not present

## 2015-10-08 DIAGNOSIS — N183 Chronic kidney disease, stage 3 (moderate): Secondary | ICD-10-CM | POA: Diagnosis not present

## 2015-10-08 LAB — URINE MICROSCOPIC-ADD ON
RBC / HPF: NONE SEEN RBC/hpf (ref 0–5)
Squamous Epithelial / LPF: NONE SEEN

## 2015-10-08 LAB — URINALYSIS, ROUTINE W REFLEX MICROSCOPIC
BILIRUBIN URINE: NEGATIVE
Glucose, UA: NEGATIVE mg/dL
HGB URINE DIPSTICK: NEGATIVE
Ketones, ur: NEGATIVE mg/dL
NITRITE: POSITIVE — AB
PH: 6 (ref 5.0–8.0)
Protein, ur: NEGATIVE mg/dL
SPECIFIC GRAVITY, URINE: 1.016 (ref 1.005–1.030)

## 2015-10-08 NOTE — ED Provider Notes (Signed)
  Face-to-face evaluation   History: Elderly female, with fall, neck is unknown.  Physical exam: Alert, anxious, without signs of focal injury.  Medical screening examination/treatment/procedure(s) were conducted as a shared visit with non-physician practitioner(s) and myself.  I personally evaluated the patient during the encounter   Mancel Bale, MD 10/10/15 1237

## 2015-10-08 NOTE — ED Notes (Signed)
Bed: [facility] Expected date:  Expected time:  Means of arrival:  Comments: EMS 82 yo female from SNF/unwitnessed fall-no obvious injuries/dementia

## 2015-10-08 NOTE — ED Triage Notes (Signed)
Brought in by EMS from [facility] facility for evaluation after her unwitnessed fall tonight.  Per EMS, pt was found lying on floor beside her bed.  No s/s apparent injury noted by staff or EMS.  Per NH staff, pt exhibits no change on her level of mental status post fall---- pt is within her normal mentation and responsiveness.  Pt has dementia.

## 2015-10-08 NOTE — ED Provider Notes (Signed)
[facility]-EMERGENCY DEPT Provider Note   CSN: [id number] Arrival date & time: 10/08/15  2127  First Provider Contact:  First MD Initiated Contact with Patient 10/08/15 2211    By signing my name below, I, Evon Slack, attest that this documentation has been prepared under the direction and in the presence of [employer], PA-C. Electronically Signed: Evon Slack, ED Scribe. 10/08/15. 10:16 PM.   History   Chief Complaint Chief Complaint  Patient presents with  . Fall    LEVEL 5 CAVEAT SECONDARY TO DEMENTIA  HPI PHILLICIA DELAROCA is a 82 y.o. female.  The history is provided by the EMS personnel. No language interpreter was used.  Fall   HPI Comments: Level 5 Caveat: Dementia  SHAREE KRIES is a 82 y.o. female brought in by ambulance, who presents to the Emergency Department for unwitnessed fall today. Per nursing note pt comes from nursing note and was found laying beside her bed. Pt does not appear to have any injuries in the ED.    Past Medical History:  Diagnosis Date  . Anxiety   . Chronic kidney disease   . Dementia   . Diabetes type 2, controlled (HCC)   . GERD (gastroesophageal reflux disease)   . Osteoporosis     Patient Active Problem List   Diagnosis Date Noted  . Acute encephalopathy 01/26/2015  . Dyslipidemia 01/26/2015  . CKD (chronic kidney disease) stage 3, GFR 30-59 ml/min 01/26/2015  . Anemia of chronic kidney failure 01/26/2015  . Sepsis (HCC) 01/25/2015  . Pulmonary embolism (HCC) 05/12/2011  . Dementia 05/12/2011    History reviewed. No pertinent surgical history.  OB History    No data available       Home Medications    Prior to Admission medications   Medication Sig Start Date End Date Taking? Authorizing Provider  acetaminophen (TYLENOL) 500 MG tablet Take 500 mg by mouth every 4 (four) hours as needed for mild pain, moderate pain, fever or headache.   Yes Historical Provider, MD  acetaminophen (TYLENOL) 650 MG CR tablet  Take 650 mg by mouth 3 (three) times daily.   Yes Historical Provider, MD  ALPRAZolam (XANAX) 0.25 MG tablet Take 1 tablet (0.25 mg total) by mouth 2 (two) times daily. 01/27/15  Yes Alison Murray, MD  alum & mag hydroxide-simeth (MAALOX/MYLANTA) 200-200-20 MG/5ML suspension Take 30 mLs by mouth every 6 (six) hours as needed for indigestion or heartburn.   Yes Historical Provider, MD  amLODipine (NORVASC) 5 MG tablet Take 1 tablet (5 mg total) by mouth daily. 01/27/15  Yes Alison Murray, MD  divalproex (DEPAKOTE SPRINKLE) 125 MG capsule Take 250 mg by mouth 3 (three) times daily.    Yes Historical Provider, MD  guaifenesin (ROBITUSSIN) 100 MG/5ML syrup Take 200 mg by mouth every 6 (six) hours as needed for cough.   Yes Historical Provider, MD  haloperidol (HALDOL) 0.5 MG tablet Take 1 tablet (0.5 mg total) by mouth at bedtime. 01/27/15  Yes Alison Murray, MD  loperamide (IMODIUM) 2 MG capsule Take 2 mg by mouth every 3 (three) hours as needed for diarrhea or loose stools.    Yes Historical Provider, MD  magnesium hydroxide (MILK OF MAGNESIA) 400 MG/5ML suspension Take 30 mLs by mouth at bedtime as needed for mild constipation or moderate constipation.   Yes Historical Provider, MD  Multiple Vitamin (DAILY VITE) TABS Take 1 tablet by mouth daily.    Yes Historical Provider, MD  neomycin-bacitracin-polymyxin (NEOSPORIN) [phone]  ointment Apply 1 application topically as needed (for minor skin tears/abrasions).    Yes Historical Provider, MD  Nutritional Supplements (NUTRITIONAL DRINK) LIQD Take 1 Bottle by mouth 3 (three) times daily.    Yes Historical Provider, MD  rivastigmine (EXELON) 3 MG capsule Take 3 mg by mouth 2 (two) times daily.   Yes Historical Provider, MD  Skin Protectants, Misc. (MINERIN) CREA Apply 1 application topically 2 (two) times daily.    Yes Historical Provider, MD  Vitamins-Lipotropics (LIPO-FLAVONOID PLUS PO) Take 1 tablet by mouth daily.    Yes Historical Provider, MD    cephALEXin (KEFLEX) 250 MG/5ML suspension Take 10 mLs (500 mg total) by mouth 2 (two) times daily. 10/09/15 10/16/15  Antony Madura, PA-C    Family History History reviewed. No pertinent family history.  Social History Social History  Substance Use Topics  . Smoking status: Never Smoker  . Smokeless tobacco: Never Used  . Alcohol use No     Allergies   Review of patient's allergies indicates no known allergies.   Review of Systems Review of Systems  Unable to perform ROS: Dementia    Physical Exam Updated Vital Signs BP 109/55   Pulse 74   Temp 97.9 F (36.6 C)   Resp 18   SpO2 98%   Physical Exam  Constitutional: She appears well-developed and well-nourished. No distress.  Nontoxic appearing  HENT:  Head: Normocephalic and atraumatic.  No Battle sign or raccoons eyes. No skull instability, hematoma, or contusion.  Eyes: Conjunctivae and EOM are normal. No scleral icterus.  Neck: Normal range of motion.  Normal ROM of cervical spine appreciated.  Cardiovascular: Normal rate, regular rhythm and intact distal pulses.   Pulmonary/Chest: Effort normal. No respiratory distress.  Respirations even and unlabored  Musculoskeletal: Normal range of motion.  Neurological: She is alert.  Patient moving all extremities. GCS 15.  Skin: Skin is warm and dry. No rash noted. She is not diaphoretic. No erythema. No pallor.  Psychiatric: Her behavior is normal. She is noncommunicative.  Patient calm  Nursing note and vitals reviewed.    ED Treatments / Results  DIAGNOSTIC STUDIES: Oxygen Saturation is 98% on RA, normal by my interpretation.    COORDINATION OF CARE:   Labs (all labs ordered are listed, but only abnormal results are displayed) Labs Reviewed  URINALYSIS, ROUTINE W REFLEX MICROSCOPIC (NOT AT [facility]) - Abnormal; Notable for the following:       Result Value   APPearance CLOUDY (*)    Nitrite POSITIVE (*)    Leukocytes, UA MODERATE (*)    All other components  within normal limits  URINE MICROSCOPIC-ADD ON - Abnormal; Notable for the following:    Bacteria, UA MANY (*)    All other components within normal limits  URINE CULTURE    EKG  EKG Interpretation None       Radiology Dg Pelvis 1-2 Views  Result Date: 10/08/2015 CLINICAL DATA:  Recent fall with left hip pain, initial encounter EXAM: PELVIS - 1-2 VIEW COMPARISON:  None. FINDINGS: Pelvic ring is intact. No acute fracture or dislocation is noted. No soft tissue abnormality is seen. Degenerative changes of the hip joints are noted bilaterally. IMPRESSION: No acute abnormality noted. Electronically Signed   By: Alcide Clever M.D.   On: 10/08/2015 23:20  Ct Head Wo Contrast  Result Date: 10/09/2015 CLINICAL DATA:  Unwitnessed fall this evening. EXAM: CT HEAD WITHOUT CONTRAST CT CERVICAL SPINE WITHOUT CONTRAST TECHNIQUE: Multidetector CT imaging of the head  and cervical spine was performed following the standard protocol without intravenous contrast. Multiplanar CT image reconstructions of the cervical spine were also generated. COMPARISON:  Head and C-spine CT - 10/06/2015; 06/02/2015 FINDINGS: CT HEAD FINDINGS Regional soft tissues appear normal. No radiopaque foreign body. No displaced calvarial fracture. Similar findings of advanced atrophy with sulcal prominence centralized volume loss with commensurate expected dilatation the ventricular system. Extensive, nearly confluent periventricular hypodensities are unchanged compatible microvascular ischemic disease. The gray-white differentiation is otherwise well maintained without CT evidence of acute large territory infarct. No intraparenchymal or extra-axial mass or hemorrhage. Unchanged size and configuration of the ventricles and basilar cisterns. No midline shift. Intracranial atherosclerosis. There is chronic opacification of the right frontal sinus. The remaining paranasal sinuses mastoid air cells are normally aerated. No air-fluid levels. Post  lateral cataract surgery. CT CERVICAL SPINE FINDINGS C1 to the superior endplate of T3 is imaged. There is straightening and slight reversal the expected cervical lordosis. No anterolisthesis or retrolisthesis. The bilateral facets are normally aligned. The dens is normally positioned between the lateral masses of C1. Normal atlantodental and atlantoaxial articulations. No fracture or static subluxation of cervical spine. Cervical vertebral body heights are preserved. Prevertebral soft tissues are normal. Mild to moderate multilevel cervical spine DDD, worse at C3-C4 with disc space height loss, endplate irregularity and small posteriorly directed disc osteophyte complexes at this location. Calcifications within the bilateral carotid bulbs. No bulky cervical lymphadenopathy on this noncontrast examination. Normal noncontrast appearance of the thyroid gland. Limited visualization of lung apices is normal. Note is made of tiny bilateral cervical ribs. IMPRESSION: 1. Advanced atrophy and microvascular disease without acute intracranial process. 2. No fracture or static subluxation of the cervical spine. 3. Mild to moderate multilevel cervical spine DDD. Electronically Signed   By: Simonne Come M.D.   On: 10/09/2015 01:01  Ct Cervical Spine Wo Contrast  Result Date: 10/09/2015 CLINICAL DATA:  Unwitnessed fall this evening. EXAM: CT HEAD WITHOUT CONTRAST CT CERVICAL SPINE WITHOUT CONTRAST TECHNIQUE: Multidetector CT imaging of the head and cervical spine was performed following the standard protocol without intravenous contrast. Multiplanar CT image reconstructions of the cervical spine were also generated. COMPARISON:  Head and C-spine CT - 10/06/2015; 06/02/2015 FINDINGS: CT HEAD FINDINGS Regional soft tissues appear normal. No radiopaque foreign body. No displaced calvarial fracture. Similar findings of advanced atrophy with sulcal prominence centralized volume loss with commensurate expected dilatation the  ventricular system. Extensive, nearly confluent periventricular hypodensities are unchanged compatible microvascular ischemic disease. The gray-white differentiation is otherwise well maintained without CT evidence of acute large territory infarct. No intraparenchymal or extra-axial mass or hemorrhage. Unchanged size and configuration of the ventricles and basilar cisterns. No midline shift. Intracranial atherosclerosis. There is chronic opacification of the right frontal sinus. The remaining paranasal sinuses mastoid air cells are normally aerated. No air-fluid levels. Post lateral cataract surgery. CT CERVICAL SPINE FINDINGS C1 to the superior endplate of T3 is imaged. There is straightening and slight reversal the expected cervical lordosis. No anterolisthesis or retrolisthesis. The bilateral facets are normally aligned. The dens is normally positioned between the lateral masses of C1. Normal atlantodental and atlantoaxial articulations. No fracture or static subluxation of cervical spine. Cervical vertebral body heights are preserved. Prevertebral soft tissues are normal. Mild to moderate multilevel cervical spine DDD, worse at C3-C4 with disc space height loss, endplate irregularity and small posteriorly directed disc osteophyte complexes at this location. Calcifications within the bilateral carotid bulbs. No bulky cervical lymphadenopathy on this noncontrast  examination. Normal noncontrast appearance of the thyroid gland. Limited visualization of lung apices is normal. Note is made of tiny bilateral cervical ribs. IMPRESSION: 1. Advanced atrophy and microvascular disease without acute intracranial process. 2. No fracture or static subluxation of the cervical spine. 3. Mild to moderate multilevel cervical spine DDD. Electronically Signed   By: Simonne Come M.D.   On: 10/09/2015 01:01   Procedures Procedures (including critical care time)  Medications Ordered in ED Medications  cephALEXin (KEFLEX) 250  MG/5ML suspension 500 mg (500 mg Oral Given 10/09/15 0102)     Initial Impression / Assessment and Plan / ED Course  I have reviewed the triage vital signs and the nursing notes.  Pertinent labs & imaging results that were available during my care of the patient were reviewed by me and considered in my medical decision making (see chart for details).  Clinical Course    82 year old female presents to the emergency department from her nursing facility after an unwitnessed fall. Pelvic x-ray without acute findings. No emergent intracranial or cervical process identified on CT scan. Facility communicated that patient was mentating at her baseline. It is possible that her fall may have been perpetuated by a urinary tract infection given UA results today. Urine sent for culture. Will start on Keflex. No indication for further emergent workup. Patient discharged in satisfactory condition.   Final Clinical Impressions(s) / ED Diagnoses   Final diagnoses:  UTI (lower urinary tract infection)  Fall, initial encounter    New Prescriptions New Prescriptions   CEPHALEXIN (KEFLEX) 250 MG/5ML SUSPENSION    Take 10 mLs (500 mg total) by mouth 2 (two) times daily.    I personally performed the services described in this documentation, which was scribed in my presence. The recorded information has been reviewed and is accurate.       Antony Madura, PA-C 10/09/15 0201    Mancel Bale, MD 10/10/15 [phone]

## 2015-10-09 DIAGNOSIS — N390 Urinary tract infection, site not specified: Secondary | ICD-10-CM | POA: Diagnosis not present

## 2015-10-09 MED ORDER — CEPHALEXIN 500 MG PO CAPS: 500.0000 mg | ORAL_CAPSULE | Freq: Once | ORAL | Status: DC

## 2015-10-09 MED ORDER — CEPHALEXIN 250 MG/5ML PO SUSR: 500.0000 mg | Freq: Two times a day (BID) | ORAL | 0 refills | Status: AC

## 2015-10-09 MED ORDER — CEPHALEXIN 250 MG/5ML PO SUSR
500.0000 mg | Freq: Once | ORAL | Status: AC
  Administered 2015-10-09: 500 mg via ORAL
  Filled 2015-10-09: qty 10

## 2015-10-09 NOTE — ED Notes (Signed)
[facility] here to transport pt back to [facility]. 

## 2015-10-09 NOTE — ED Notes (Signed)
[facility] was called for pt's transportation back to [facility] facility.

## 2015-10-11 LAB — URINE CULTURE

## 2015-10-12 ENCOUNTER — Telehealth (HOSPITAL_BASED_OUTPATIENT_CLINIC_OR_DEPARTMENT_OTHER): Payer: Self-pay

## 2015-10-12 NOTE — Telephone Encounter (Signed)
Post ED Visit - Positive Culture Follow-up  Culture report reviewed by antimicrobial stewardship pharmacist:  []  Enzo Bi, Pharm.D. []  Celedonio Miyamoto, Pharm.D., BCPS []  Garvin Fila, Pharm.D. []  Georgina Pillion, Pharm.D., BCPS []  [place], [address].D., BCPS, AAHIVP []  Estella Husk, Pharm.D., BCPS, AAHIVP []  Tennis Must, Pharm.D. []  Sherle Poe, [place].D. Revonda Standard Masters Pharm D Positive urine culture Treated with Cephalexin, organism sensitive to the same and no further patient follow-up is required at this time.  Jerry Caras 10/12/2015, 11:56 AM

## 2015-12-17 ENCOUNTER — Emergency Department (HOSPITAL_COMMUNITY): Payer: Medicare Other

## 2015-12-17 ENCOUNTER — Encounter (HOSPITAL_COMMUNITY): Payer: Self-pay | Admitting: Emergency Medicine

## 2015-12-17 ENCOUNTER — Emergency Department (HOSPITAL_COMMUNITY)
Admission: EM | Admit: 2015-12-17 | Discharge: 2015-12-18 | Disposition: A | Discharge status: Home / Self Care | Payer: Medicare Other | Attending: Emergency Medicine | Admitting: Emergency Medicine

## 2015-12-17 DIAGNOSIS — Y999 Unspecified external cause status: Secondary | ICD-10-CM | POA: Diagnosis not present

## 2015-12-17 DIAGNOSIS — E119 Type 2 diabetes mellitus without complications: Secondary | ICD-10-CM | POA: Insufficient documentation

## 2015-12-17 DIAGNOSIS — Y929 Unspecified place or not applicable: Secondary | ICD-10-CM | POA: Diagnosis not present

## 2015-12-17 DIAGNOSIS — W19XXXA Unspecified fall, initial encounter: Secondary | ICD-10-CM | POA: Insufficient documentation

## 2015-12-17 DIAGNOSIS — Y939 Activity, unspecified: Secondary | ICD-10-CM | POA: Diagnosis not present

## 2015-12-17 DIAGNOSIS — Z043 Encounter for examination and observation following other accident: Secondary | ICD-10-CM | POA: Diagnosis not present

## 2015-12-17 DIAGNOSIS — N183 Chronic kidney disease, stage 3 (moderate): Secondary | ICD-10-CM | POA: Insufficient documentation

## 2015-12-17 DIAGNOSIS — R930 Abnormal findings on diagnostic imaging of skull and head, not elsewhere classified: Secondary | ICD-10-CM | POA: Insufficient documentation

## 2015-12-17 NOTE — ED Provider Notes (Signed)
[facility]-EMERGENCY DEPT Provider Note   CSN: [id number] Arrival date & time: 12/17/15  1831  By signing my name below, I, Rosario Adie, attest that this documentation has been prepared under the direction and in the presence of Gilda Crease, MD. Electronically Signed: Rosario Adie, ED Scribe. 12/17/15. 8:28 PM.  History   Chief Complaint Chief Complaint  Patient presents with  . Fall   LEVEL 5 CAVEAT: HPI and ROS limited due to dementia  The history is provided by the patient and the nursing home. History limited by: dementia. No language interpreter was used.   HPI Comments: Pamela Ramirez is a 82 y.o. female BIB EMS from [place], who presents to the Emergency Department for evaluation s/p unwitnessed fall that occurred ~45 minutes PTA. Facility denies LOC. Facility additionally notes that pt has been acting toward baseline since the incident.   Past Medical History:  Diagnosis Date  . Anxiety   . Chronic kidney disease   . Dementia   . Diabetes type 2, controlled (HCC)   . GERD (gastroesophageal reflux disease)   . Osteoporosis    Patient Active Problem List   Diagnosis Date Noted  . Acute encephalopathy 01/26/2015  . Dyslipidemia 01/26/2015  . CKD (chronic kidney disease) stage 3, GFR 30-59 ml/min 01/26/2015  . Anemia of chronic kidney failure 01/26/2015  . Sepsis (HCC) 01/25/2015  . Pulmonary embolism (HCC) 05/12/2011  . Dementia 05/12/2011   History reviewed. No pertinent surgical history.  OB History    No data available     Home Medications    Prior to Admission medications   Medication Sig Start Date End Date Taking? Authorizing Provider  acetaminophen (TYLENOL) 500 MG tablet Take 500 mg by mouth every 4 (four) hours as needed for mild pain, moderate pain, fever or headache.   Yes Historical Provider, MD  acetaminophen (TYLENOL) 650 MG CR tablet Take 650 mg by mouth 3 (three) times daily.   Yes Historical Provider, MD    ALPRAZolam (XANAX) 0.25 MG tablet Take 1 tablet (0.25 mg total) by mouth 2 (two) times daily. 01/27/15  Yes Alison Murray, MD  alum & mag hydroxide-simeth (MAALOX/MYLANTA) 200-200-20 MG/5ML suspension Take 30 mLs by mouth every 6 (six) hours as needed for indigestion or heartburn.   Yes Historical Provider, MD  amLODipine (NORVASC) 5 MG tablet Take 1 tablet (5 mg total) by mouth daily. 01/27/15  Yes Alison Murray, MD  divalproex (DEPAKOTE SPRINKLE) 125 MG capsule Take 250 mg by mouth 3 (three) times daily.    Yes Historical Provider, MD  guaifenesin (ROBITUSSIN) 100 MG/5ML syrup Take 200 mg by mouth every 6 (six) hours as needed for cough.   Yes Historical Provider, MD  haloperidol (HALDOL) 0.5 MG tablet Take 1 tablet (0.5 mg total) by mouth at bedtime. 01/27/15  Yes Alison Murray, MD  loperamide (IMODIUM) 2 MG capsule Take 2 mg by mouth every 3 (three) hours as needed for diarrhea or loose stools.    Yes Historical Provider, MD  magnesium hydroxide (MILK OF MAGNESIA) 400 MG/5ML suspension Take 30 mLs by mouth at bedtime as needed for mild constipation or moderate constipation.   Yes Historical Provider, MD  Multiple Vitamin (DAILY VITE) TABS Take 1 tablet by mouth daily.    Yes Historical Provider, MD  neomycin-bacitracin-polymyxin (NEOSPORIN) [phone] ointment Apply 1 application topically as needed (for minor skin tears/abrasions).    Yes Historical Provider, MD  Nutritional Supplements (NUTRITIONAL DRINK) LIQD Take 1 Bottle  by mouth 3 (three) times daily.    Yes Historical Provider, MD  rivastigmine (EXELON) 3 MG capsule Take 3 mg by mouth 2 (two) times daily.   Yes Historical Provider, MD  Skin Protectants, Misc. (MINERIN) CREA Apply 1 application topically 2 (two) times daily.    Yes Historical Provider, MD  Vitamins-Lipotropics (LIPO-FLAVONOID PLUS PO) Take 1 tablet by mouth daily.    Yes Historical Provider, MD   Family History History reviewed. No pertinent family history.  Social  History Social History  Substance Use Topics  . Smoking status: Never Smoker  . Smokeless tobacco: Never Used  . Alcohol use No   Allergies   Review of patient's allergies indicates no known allergies.  Review of Systems Review of Systems  Unable to perform ROS: Dementia   Physical Exam Updated Vital Signs BP 123/62 (BP Location: Right Arm)   Pulse (!) 58   Resp 18   SpO2 97%   Physical Exam  Constitutional: She is oriented to person, place, and time. She appears well-developed and well-nourished. No distress.  HENT:  Head: Normocephalic and atraumatic.  Right Ear: Hearing normal.  Left Ear: Hearing normal.  Nose: Nose normal.  Mouth/Throat: Oropharynx is clear and moist and mucous membranes are normal.  Eyes: Conjunctivae and EOM are normal. Pupils are equal, round, and reactive to light.  Neck: Normal range of motion. Neck supple.  Cardiovascular: Regular rhythm, S1 normal and S2 normal.  Exam reveals no gallop and no friction rub.   No murmur heard. Pulmonary/Chest: Effort normal and breath sounds normal. No respiratory distress. She exhibits no tenderness.  Abdominal: Soft. Normal appearance and bowel sounds are normal. There is no hepatosplenomegaly. There is no tenderness. There is no rebound, no guarding, no tenderness at McBurney's point and negative Murphy's sign. No hernia.  Musculoskeletal: Normal range of motion.  Neurological: She is alert and oriented to person, place, and time. She has normal strength. No cranial nerve deficit or sensory deficit. Coordination normal. GCS eye subscore is 4. GCS verbal subscore is 5. GCS motor subscore is 6.  Skin: Skin is warm, dry and intact. No rash noted. No cyanosis.  Psychiatric: She has a normal mood and affect. Her speech is normal and behavior is normal. Thought content normal.  Nursing note and vitals reviewed.  ED Treatments / Results  DIAGNOSTIC STUDIES: Oxygen Saturation is 97% on RA, normal by my interpretation.    Labs (all labs ordered are listed, but only abnormal results are displayed) Labs Reviewed - No data to display  EKG  EKG Interpretation None      Radiology Ct Head Wo Contrast  Result Date: 12/17/2015 CLINICAL DATA:  Fall. No reported loss of consciousness. Initial encounter. EXAM: CT HEAD WITHOUT CONTRAST CT CERVICAL SPINE WITHOUT CONTRAST TECHNIQUE: Multidetector CT imaging of the head and cervical spine was performed following the standard protocol without intravenous contrast. Multiplanar CT image reconstructions of the cervical spine were also generated. COMPARISON:  10/09/2015 FINDINGS: CT HEAD FINDINGS Brain: Moderate enlargement of the lateral and third ventricles is unchanged from 11/30/2013 and may reflect central predominant cerebral atrophy or normal pressure hydrocephalus. Periventricular white matter hypodensities are unchanged and nonspecific but compatible with chronic small vessel ischemic disease. There is no evidence of acute cortical infarct, intracranial hemorrhage, mass, midline shift, or extra-axial fluid collection. Vascular: Calcified atherosclerosis at the skullbase. Skull: No skull fracture or suspicious osseous lesion. Sinuses/Orbits: Prior cataract extraction. Visualized paranasal sinuses and mastoid air cells are clear. Other: None.  CT CERVICAL SPINE FINDINGS Alignment: Unchanged cervical spine straightening. No evidence of traumatic subluxation. Skull base and vertebrae: No acute fracture or destructive osseous lesion identified. Soft tissues and spinal canal: No prevertebral fluid or swelling. No visible canal hematoma. Disc levels: Unchanged multilevel cervical spondylosis. Unchanged advanced disc space narrowing at C3-4. Upper chest: Unremarkable. Other: Bilateral cervical carotid artery atherosclerosis. IMPRESSION: 1. No evidence of acute intracranial abnormality. 2. Unchanged ventriculomegaly and chronic small vessel ischemic disease. 3. No evidence of acute  cervical spine injury. Electronically Signed   By: Sebastian AcheAllen  Grady M.D.   On: 12/17/2015 22:45   Ct Cervical Spine Wo Contrast  Result Date: 12/17/2015 CLINICAL DATA:  Fall. No reported loss of consciousness. Initial encounter. EXAM: CT HEAD WITHOUT CONTRAST CT CERVICAL SPINE WITHOUT CONTRAST TECHNIQUE: Multidetector CT imaging of the head and cervical spine was performed following the standard protocol without intravenous contrast. Multiplanar CT image reconstructions of the cervical spine were also generated. COMPARISON:  10/09/2015 FINDINGS: CT HEAD FINDINGS Brain: Moderate enlargement of the lateral and third ventricles is unchanged from 11/30/2013 and may reflect central predominant cerebral atrophy or normal pressure hydrocephalus. Periventricular white matter hypodensities are unchanged and nonspecific but compatible with chronic small vessel ischemic disease. There is no evidence of acute cortical infarct, intracranial hemorrhage, mass, midline shift, or extra-axial fluid collection. Vascular: Calcified atherosclerosis at the skullbase. Skull: No skull fracture or suspicious osseous lesion. Sinuses/Orbits: Prior cataract extraction. Visualized paranasal sinuses and mastoid air cells are clear. Other: None. CT CERVICAL SPINE FINDINGS Alignment: Unchanged cervical spine straightening. No evidence of traumatic subluxation. Skull base and vertebrae: No acute fracture or destructive osseous lesion identified. Soft tissues and spinal canal: No prevertebral fluid or swelling. No visible canal hematoma. Disc levels: Unchanged multilevel cervical spondylosis. Unchanged advanced disc space narrowing at C3-4. Upper chest: Unremarkable. Other: Bilateral cervical carotid artery atherosclerosis. IMPRESSION: 1. No evidence of acute intracranial abnormality. 2. Unchanged ventriculomegaly and chronic small vessel ischemic disease. 3. No evidence of acute cervical spine injury. Electronically Signed   By: Sebastian AcheAllen  Grady M.D.    On: 12/17/2015 22:45    Procedures Procedures (including critical care time)  Medications Ordered in ED Medications - No data to display  Initial Impression / Assessment and Plan / ED Course  I have reviewed the triage vital signs and the nursing notes.  Pertinent labs & imaging results that were available during my care of the patient were reviewed by me and considered in my medical decision making (see chart for details).  Clinical Course   Patient presents to the emergency department for evaluation after a fall. Patient has a history of severe dementia and frequent falls. CT head and cervical spine performed, no acute abnormalities noted. Patient has been intermittently agitated and will not tolerate plain film x-ray. She was rubbing her lower leg upon arrival to the emergency department. She has, however, been able to ambulate without difficulty here in the ER. She will not sit still work and sent to x-ray, it is felt that she is unlikely experiencing a fracture based on her ability to ambulate. Patient will be discharged back to the nursing home.  Final Clinical Impressions(s) / ED Diagnoses   Final diagnoses:  Fall, initial encounter   New Prescriptions New Prescriptions   No medications on file   I personally performed the services described in this documentation, which was scribed in my presence. The recorded information has been reviewed and is accurate.     Gilda Creasehristopher J Nalleli Largent, MD  12/18/15 0209  

## 2015-12-17 NOTE — ED Notes (Signed)
Re-attempted to check temperature pt still would not let me.
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
## 2015-12-17 NOTE — ED Notes (Signed)
Pt would not let me check her temperature

## 2015-12-17 NOTE — ED Triage Notes (Signed)
Per EMS-fell 45 minutes ago, no injury, no LOC-at baseline mentally per facility[facility] [address]

## 2015-12-17 NOTE — ED Notes (Signed)
Bed: [facility] Expected date:  Expected time:  Means of arrival:  Comments: EMS fall 

## 2015-12-18 NOTE — ED Notes (Signed)
Patient was walked with assistance. Patient did not have any problem walking.

## 2015-12-18 NOTE — ED Notes (Signed)
Patient being picked up by [facility] to be taken back to facility.

## 2016-01-24 ENCOUNTER — Emergency Department (HOSPITAL_COMMUNITY): Payer: Medicare Other

## 2016-01-24 ENCOUNTER — Emergency Department (HOSPITAL_COMMUNITY)
Admission: EM | Admit: 2016-01-24 | Discharge: 2016-01-24 | Disposition: A | Discharge status: Home / Self Care | Payer: Medicare Other | Attending: Emergency Medicine | Admitting: Emergency Medicine

## 2016-01-24 DIAGNOSIS — Y929 Unspecified place or not applicable: Secondary | ICD-10-CM | POA: Diagnosis not present

## 2016-01-24 DIAGNOSIS — Y939 Activity, unspecified: Secondary | ICD-10-CM | POA: Diagnosis not present

## 2016-01-24 DIAGNOSIS — S0003XA Contusion of scalp, initial encounter: Secondary | ICD-10-CM | POA: Diagnosis not present

## 2016-01-24 DIAGNOSIS — N183 Chronic kidney disease, stage 3 (moderate): Secondary | ICD-10-CM | POA: Diagnosis not present

## 2016-01-24 DIAGNOSIS — S0990XA Unspecified injury of head, initial encounter: Secondary | ICD-10-CM | POA: Diagnosis present

## 2016-01-24 DIAGNOSIS — W050XXA Fall from non-moving wheelchair, initial encounter: Secondary | ICD-10-CM | POA: Diagnosis not present

## 2016-01-24 DIAGNOSIS — Y999 Unspecified external cause status: Secondary | ICD-10-CM | POA: Diagnosis not present

## 2016-01-24 DIAGNOSIS — E1122 Type 2 diabetes mellitus with diabetic chronic kidney disease: Secondary | ICD-10-CM | POA: Insufficient documentation

## 2016-01-24 NOTE — ED Provider Notes (Addendum)
[facility]-EMERGENCY DEPT Provider Note   CSN: [id number] Arrival date & time: 01/24/16  1537     History   Chief Complaint Chief Complaint  Patient presents with  . Fall    HPI Pamela Ramirez is a 82 y.o. female.  82 yo F with a chief complaints of a fall. Patient is in a wheelchair and had a witnessed fall. Struck the left side of her head. Patient is nonverbal and provides no further history.  Level V caveat dementia.    The history is provided by the patient.  Fall  This is a new problem. The current episode started 6 to 12 hours ago. The problem occurs constantly. The problem has not changed since onset.Nothing aggravates the symptoms. Nothing relieves the symptoms. She has tried nothing for the symptoms. The treatment provided no relief.    Past Medical History:  Diagnosis Date  . Anxiety   . Chronic kidney disease   . Dementia   . Diabetes type 2, controlled (HCC)   . GERD (gastroesophageal reflux disease)   . Osteoporosis     Patient Active Problem List   Diagnosis Date Noted  . Acute encephalopathy 01/26/2015  . Dyslipidemia 01/26/2015  . CKD (chronic kidney disease) stage 3, GFR 30-59 ml/min 01/26/2015  . Anemia of chronic kidney failure 01/26/2015  . Sepsis (HCC) 01/25/2015  . Pulmonary embolism (HCC) 05/12/2011  . Dementia 05/12/2011    No past surgical history on file.  OB History    No data available       Home Medications    Prior to Admission medications   Medication Sig Start Date End Date Taking? Authorizing Provider  acetaminophen (TYLENOL) 500 MG tablet Take 500 mg by mouth every 4 (four) hours as needed for mild pain, moderate pain, fever or headache.   Yes Historical Provider, MD  acetaminophen (TYLENOL) 650 MG CR tablet Take 650 mg by mouth 3 (three) times daily.   Yes Historical Provider, MD  ALPRAZolam (XANAX) 0.25 MG tablet Take 1 tablet (0.25 mg total) by mouth 2 (two) times daily. 01/27/15  Yes Alison Murray, MD  alum & mag  hydroxide-simeth (MAALOX/MYLANTA) 200-200-20 MG/5ML suspension Take 30 mLs by mouth every 6 (six) hours as needed for indigestion or heartburn.   Yes Historical Provider, MD  amLODipine (NORVASC) 5 MG tablet Take 1 tablet (5 mg total) by mouth daily. 01/27/15  Yes Alison Murray, MD  divalproex (DEPAKOTE SPRINKLE) 125 MG capsule Take 250 mg by mouth 3 (three) times daily.    Yes Historical Provider, MD  guaifenesin (ROBITUSSIN) 100 MG/5ML syrup Take 200 mg by mouth every 6 (six) hours as needed for cough.   Yes Historical Provider, MD  haloperidol (HALDOL) 0.5 MG tablet Take 1 tablet (0.5 mg total) by mouth at bedtime. 01/27/15  Yes Alison Murray, MD  loperamide (IMODIUM) 2 MG capsule Take 2 mg by mouth as needed for diarrhea or loose stools.    Yes Historical Provider, MD  magnesium hydroxide (MILK OF MAGNESIA) 400 MG/5ML suspension Take 30 mLs by mouth at bedtime as needed for mild constipation or moderate constipation.   Yes Historical Provider, MD  Multiple Vitamin (DAILY VITE) TABS Take 1 tablet by mouth daily.    Yes Historical Provider, MD  neomycin-bacitracin-polymyxin (NEOSPORIN) [phone] ointment Apply 1 application topically as needed (for minor skin tears/abrasions).    Yes Historical Provider, MD  Nutritional Supplements (NUTRITIONAL DRINK) LIQD Take 1 Bottle by mouth 3 (three) times daily.  Yes Historical Provider, MD  rivastigmine (EXELON) 3 MG capsule Take 3 mg by mouth 2 (two) times daily.   Yes Historical Provider, MD  Skin Protectants, Misc. (MINERIN) CREA Apply 1 application topically 2 (two) times daily.    Yes Historical Provider, MD  Vitamins-Lipotropics (LIPO-FLAVONOID PLUS PO) Take 1 tablet by mouth daily.    Yes Historical Provider, MD    Family History No family history on file.  Social History Social History  Substance Use Topics  . Smoking status: Never Smoker  . Smokeless tobacco: Never Used  . Alcohol use No     Allergies   Patient has no known  allergies.   Review of Systems Review of Systems  Unable to perform ROS: Patient nonverbal     Physical Exam Updated Vital Signs BP 153/67 (BP Location: Left Arm)   Pulse 75   Resp 18   SpO2 98%   Physical Exam  Constitutional: She appears well-developed and well-nourished. No distress.  HENT:  Head: Normocephalic.  Small hematoma to the left frontal.  Eyes: EOM are normal. Pupils are equal, round, and reactive to light.  Neck: Normal range of motion. Neck supple.  Cardiovascular: Normal rate and regular rhythm.  Exam reveals no gallop and no friction rub.   No murmur heard. Pulmonary/Chest: Effort normal. She has no wheezes. She has no rales.  Abdominal: Soft. She exhibits no distension. There is no tenderness.  Musculoskeletal: She exhibits no edema or tenderness.  Neurological: She is alert.  Moves all 4 extremity grossly  Skin: Skin is warm and dry. She is not diaphoretic.  Psychiatric: She has a normal mood and affect. Her behavior is normal.  Nursing note and vitals reviewed.    ED Treatments / Results  Labs (all labs ordered are listed, but only abnormal results are displayed) Labs Reviewed - No data to display  EKG  EKG Interpretation None       Radiology Ct Head Wo Contrast  Result Date: 01/24/2016 CLINICAL DATA:  Witnessed fall without loss of consciousness. Hit LEFT side of head. EXAM: CT HEAD WITHOUT CONTRAST CT CERVICAL SPINE WITHOUT CONTRAST TECHNIQUE: Multidetector CT imaging of the head and cervical spine was performed following the standard protocol without intravenous contrast. Multiplanar CT image reconstructions of the cervical spine were also generated. COMPARISON:  Head CT [id number] FINDINGS: CT HEAD FINDINGS Brain: No intracranial hemorrhage. No parenchymal contusion. No midline shift or mass effect. Basilar cisterns are patent. No skull base fracture. No fluid in the paranasal sinuses or mastoid air cells. Orbits are normal. There is  ventricular dilatation which is disproportionate to atrophy but unchanged from prior. Mild periventricular white matter hypodensities. Vascular: No hyperdense vessel or unexpected calcification. Skull: No skull fracture Sinuses/Orbits: No acute finding. Other: Small scalp hematoma over the LEFT forehead without associated spur skull fracture CT CERVICAL SPINE FINDINGS Alignment: Straightening of normal cervical lordosis. Skull base and vertebrae: Normal craniocervical junction. Soft tissues and spinal canal: No epidural paraspinal hematoma. Disc levels: Multilevel endplate osteophytosis. No acute loss vertebral body height and disc height. Upper chest: Clear Other: None IMPRESSION: 1. No intracranial trauma. 2. Small LEFT frontal scalp hematoma. 3. No skull fracture. 4. Stable ventriculomegaly. 5. No cervical spine fracture. 6. Multilevel disc osteophytic disease. Electronically Signed   By: Genevive BiStewart  Edmunds M.D.   On: 01/24/2016 17:06   Ct Cervical Spine Wo Contrast  Result Date: 01/24/2016 CLINICAL DATA:  Witnessed fall without loss of consciousness. Hit LEFT side of head. EXAM: CT HEAD  WITHOUT CONTRAST CT CERVICAL SPINE WITHOUT CONTRAST TECHNIQUE: Multidetector CT imaging of the head and cervical spine was performed following the standard protocol without intravenous contrast. Multiplanar CT image reconstructions of the cervical spine were also generated. COMPARISON:  Head CT [id number] FINDINGS: CT HEAD FINDINGS Brain: No intracranial hemorrhage. No parenchymal contusion. No midline shift or mass effect. Basilar cisterns are patent. No skull base fracture. No fluid in the paranasal sinuses or mastoid air cells. Orbits are normal. There is ventricular dilatation which is disproportionate to atrophy but unchanged from prior. Mild periventricular white matter hypodensities. Vascular: No hyperdense vessel or unexpected calcification. Skull: No skull fracture Sinuses/Orbits: No acute finding. Other: Small scalp  hematoma over the LEFT forehead without associated spur skull fracture CT CERVICAL SPINE FINDINGS Alignment: Straightening of normal cervical lordosis. Skull base and vertebrae: Normal craniocervical junction. Soft tissues and spinal canal: No epidural paraspinal hematoma. Disc levels: Multilevel endplate osteophytosis. No acute loss vertebral body height and disc height. Upper chest: Clear Other: None IMPRESSION: 1. No intracranial trauma. 2. Small LEFT frontal scalp hematoma. 3. No skull fracture. 4. Stable ventriculomegaly. 5. No cervical spine fracture. 6. Multilevel disc osteophytic disease. Electronically Signed   By: Genevive BiStewart  Edmunds M.D.   On: 01/24/2016 17:06    Procedures Procedures (including critical care time)  Medications Ordered in ED Medications - No data to display   Initial Impression / Assessment and Plan / ED Course  I have reviewed the triage vital signs and the nursing notes.  Pertinent labs & imaging results that were available during my care of the patient were reviewed by me and considered in my medical decision making (see chart for details).  Clinical Course     82 yo F With a chief complaint of a fall from her wheelchair. CT the head and C-spine are negative. Discharge home.  5:29 PM:  I have discussed the diagnosis/risks/treatment options with the patient and believe the pt to be eligible for discharge home to follow-up with PCP. We also discussed returning to the ED immediately if new or worsening sx occur. We discussed the sx which are most concerning (e.g., change in mental status, uncontrolled vomiting) that necessitate immediate return. Medications administered to the patient during their visit and any new prescriptions provided to the patient are listed below.  Medications given during this visit Medications - No data to display   The patient appears reasonably screen and/or stabilized for discharge and I doubt any other medical condition or other [facility]  requiring further screening, evaluation, or treatment in the ED at this time prior to discharge.    Final Clinical Impressions(s) / ED Diagnoses   Final diagnoses:  Contusion of scalp, initial encounter    New Prescriptions New Prescriptions   No medications on file     Melene PlanDan Montzerrat Brunell, DO 01/24/16 1728    Melene Planan Fawne Hughley, DO 01/24/16 1729

## 2016-01-24 NOTE — ED Triage Notes (Signed)
[facility] witnessed fall out of w/c no LOC ? Hit left side of head, no injury palpated. C Collar present, CBG 154.

## 2016-01-24 NOTE — ED Triage Notes (Signed)
[facility] [place] [facility]

## 2016-01-24 NOTE — ED Triage Notes (Signed)
[facility]

## 2016-01-24 NOTE — ED Notes (Signed)
Patient transported to CT 

## 2017-07-29 IMAGING — CT CT CERVICAL SPINE W/O CM
3 of 6 series · 12 of 33 positions shown, 14 images · non-contrast
Comparison: 04/11/2015

CLINICAL DATA: Recent fall

EXAM:
CT HEAD WITHOUT CONTRAST
CT CERVICAL SPINE WITHOUT CONTRAST
TECHNIQUE: Multidetector CT imaging of the head and cervical spine was
performed following the standard protocol without intravenous
contrast. Multiplanar CT image reconstructions of the cervical spine
were also generated.

[Series 8: axial recon · axial · 0.23mm/px · z∈[-312,-203]mm · 4 of 107 slices shown, 5 images]
[im 22/107  soft-tissue]
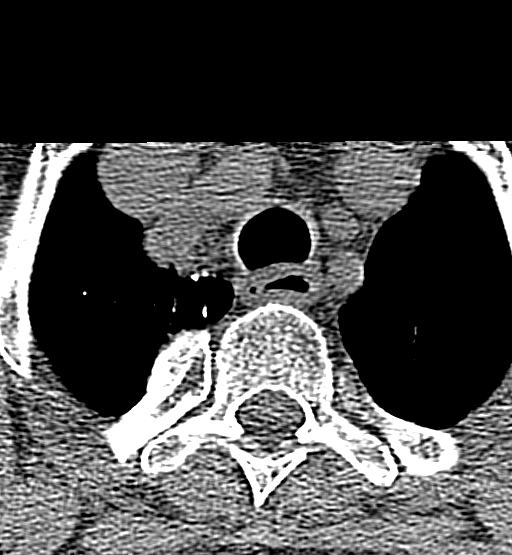
[im 22/107  bone]
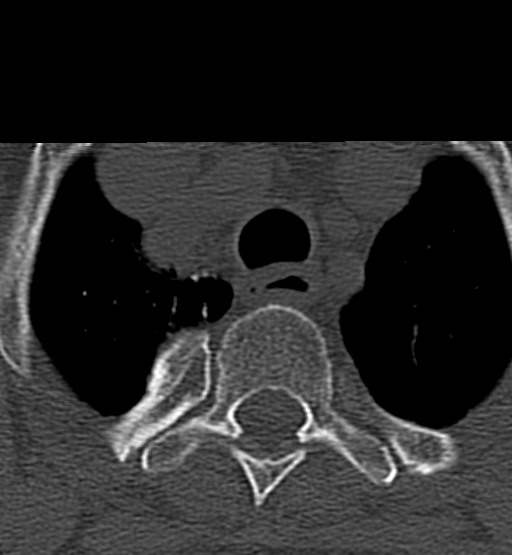
[im 43/107  bone]
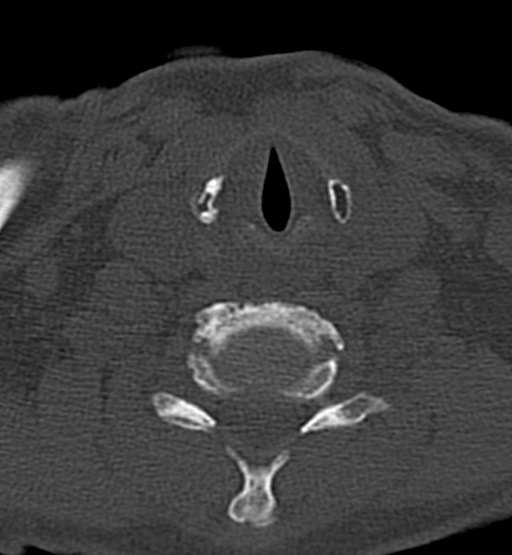
[im 64/107  bone]
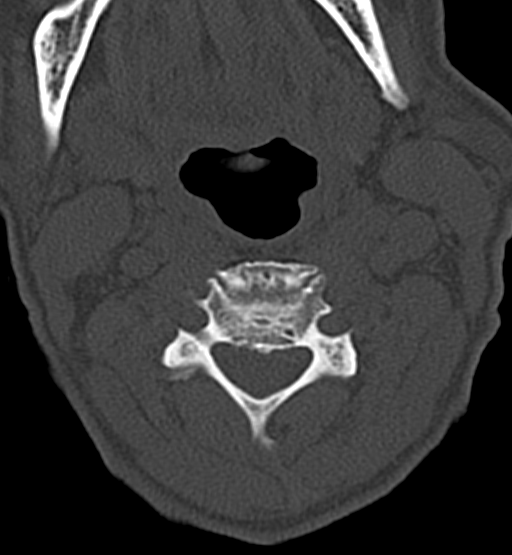
[im 85/107  bone]
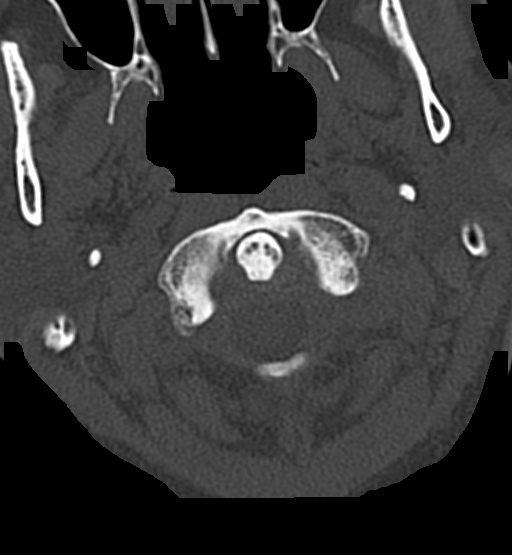

[Series 9: coronal · coronal · 0.23mm/px · 3 of 84 slices shown]
[im 25/84  bone]
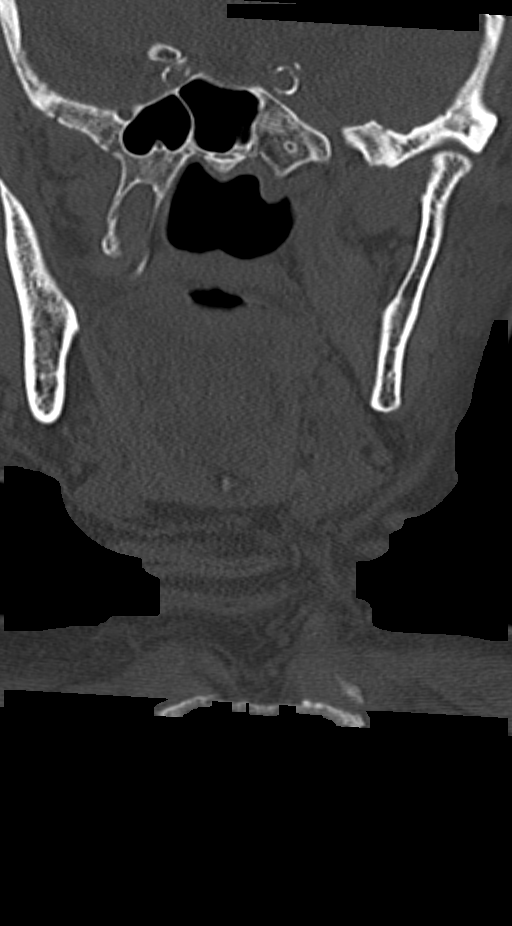
[im 36/84  bone]
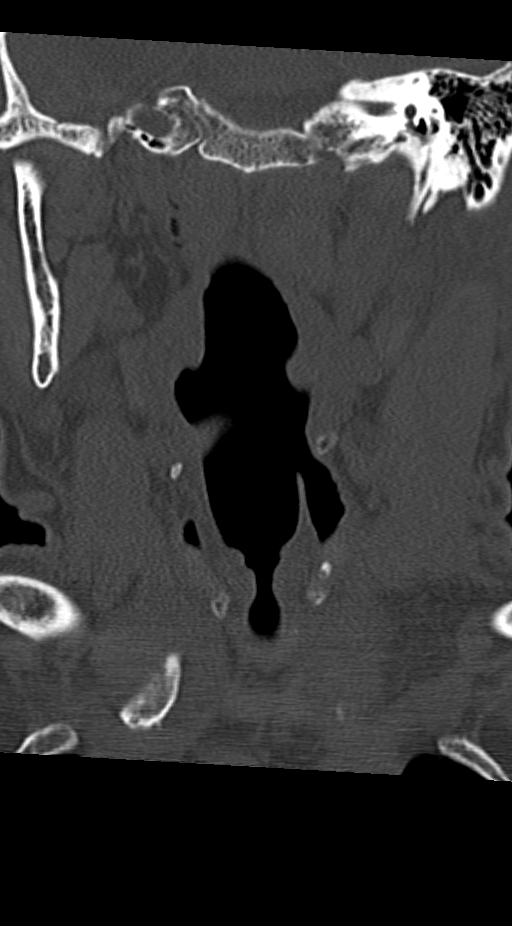
[im 48/84  bone]
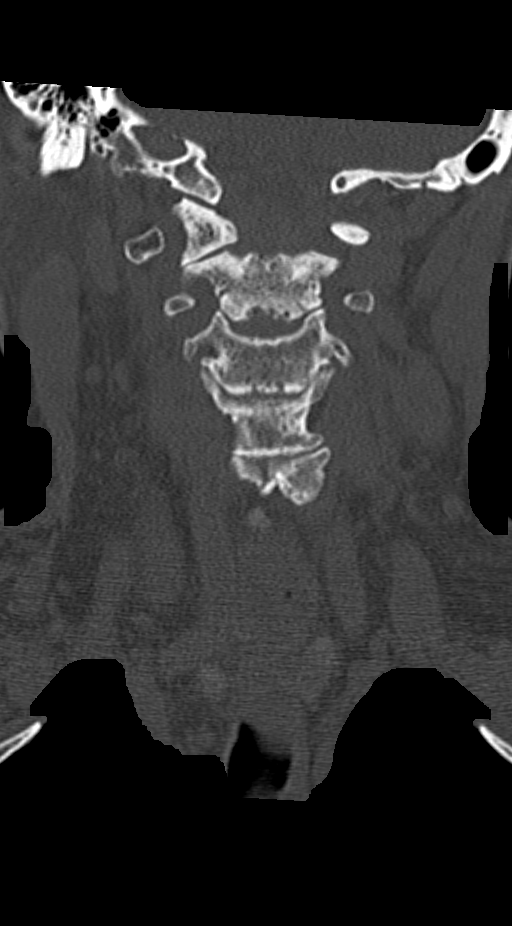

[Series 10: sagittal · sagittal · 0.26mm/px · 5 of 63 slices shown, 6 images]
[im 21/63  bone]
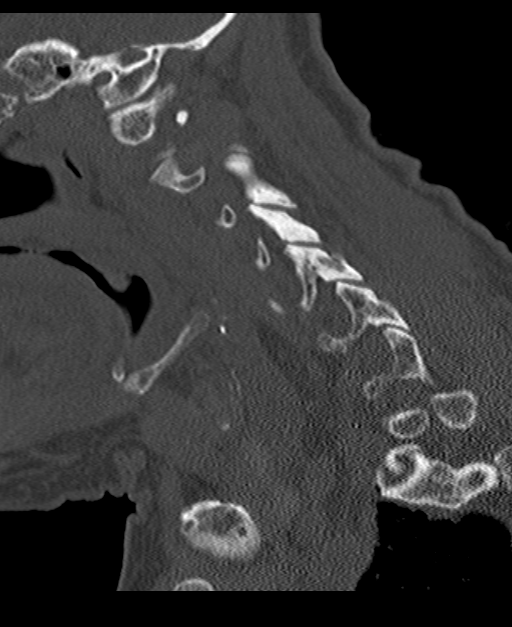
[im 26/63  bone]
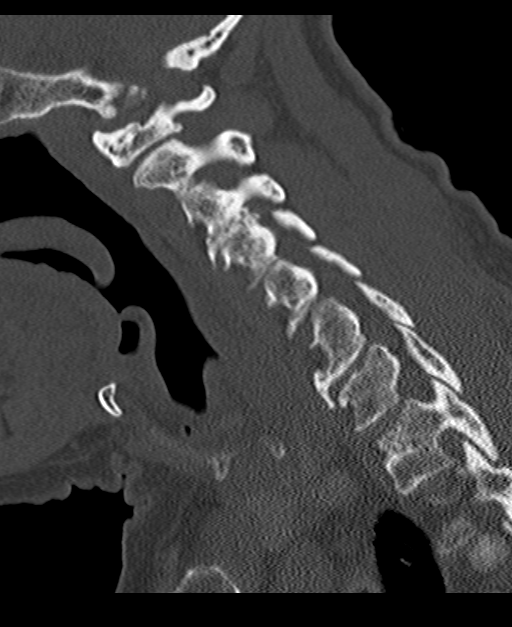
[im 32/63  soft-tissue]
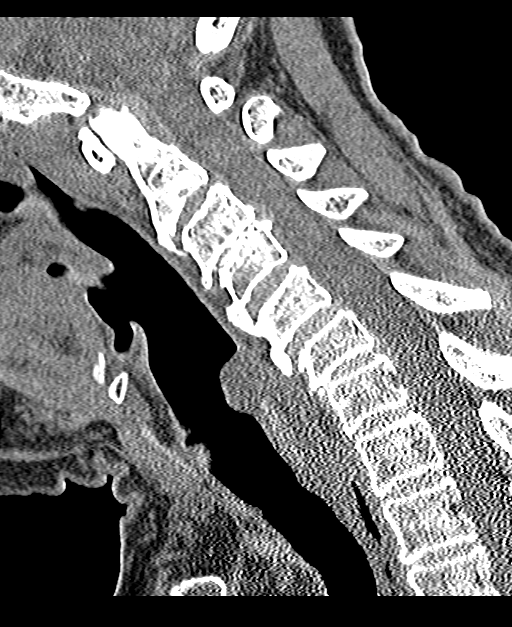
[im 32/63  bone]
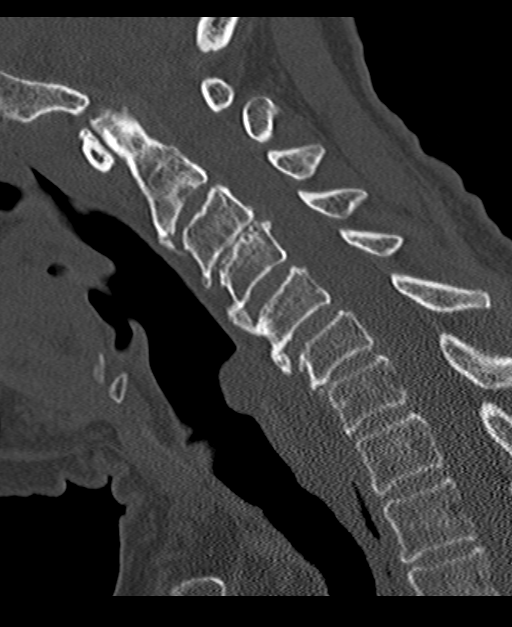
[im 37/63  bone]
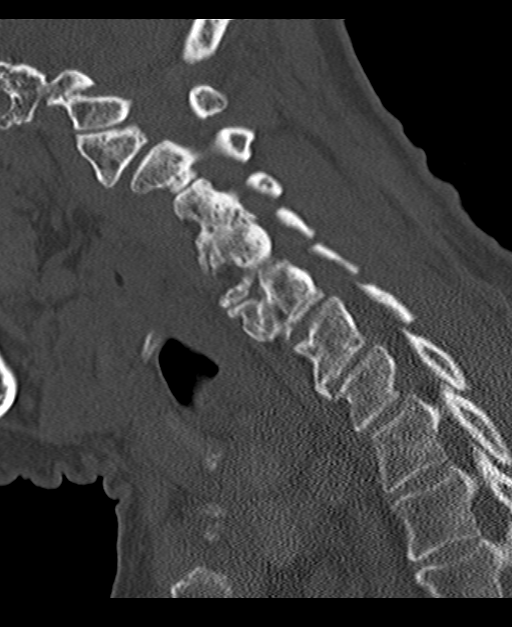
[im 42/63  bone]
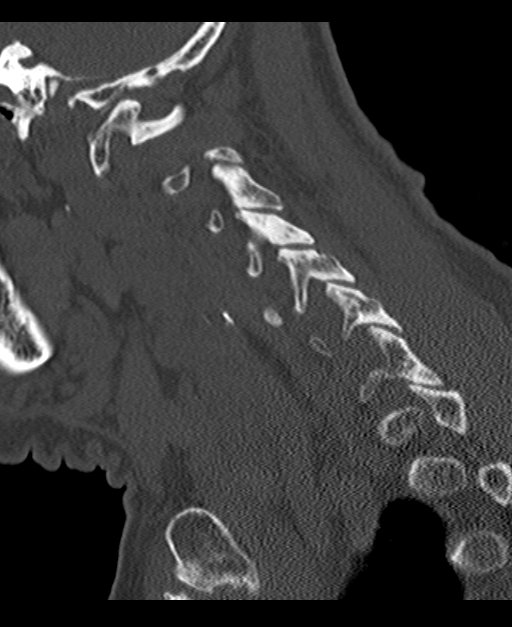

[12 of 33 positions shown; findings below may reference images not displayed]

FINDINGS: CT HEAD FINDINGS

Bony calvarium is intact. A left frontal scalp hematoma is noted
related to the recent injury. Diffuse atrophic changes and chronic
white matter ischemic changes are seen. No findings to suggest acute
hemorrhage, acute infarction or space-occupying mass lesion are
noted.

CT CERVICAL SPINE FINDINGS

Seven cervical segments are well visualized. Vertebral body height
is well maintained. Osteophytic changes are noted at multiple levels
as well as disc space narrowing worst at C3-4. No acute fracture or
acute facet abnormality is noted. No acute soft tissue abnormality
is noted. Diffuse carotid calcifications are seen.
IMPRESSION: CT of the head:  No acute intracranial abnormality noted.

Left frontal scalp hematoma new from the prior exam.

CT of the cervical spine: Degenerative changes without acute
abnormality.

## 2017-09-25 ENCOUNTER — Emergency Department (HOSPITAL_COMMUNITY): Payer: Medicare Other

## 2017-09-25 ENCOUNTER — Encounter (HOSPITAL_COMMUNITY): Payer: Self-pay | Admitting: Emergency Medicine

## 2017-09-25 ENCOUNTER — Inpatient Hospital Stay (HOSPITAL_COMMUNITY)
Admission: EM | Admit: 2017-09-25 | Discharge: 2017-09-27 | DRG: 871 | Disposition: A | Discharge status: Skilled Nursing Facility | Payer: Medicare Other | Attending: Internal Medicine | Admitting: Internal Medicine

## 2017-09-25 ENCOUNTER — Other Ambulatory Visit: Payer: Self-pay

## 2017-09-25 ENCOUNTER — Observation Stay (HOSPITAL_COMMUNITY): Payer: Medicare Other

## 2017-09-25 DIAGNOSIS — N183 Chronic kidney disease, stage 3 (moderate): Secondary | ICD-10-CM | POA: Diagnosis present

## 2017-09-25 DIAGNOSIS — Z86711 Personal history of pulmonary embolism: Secondary | ICD-10-CM

## 2017-09-25 DIAGNOSIS — K219 Gastro-esophageal reflux disease without esophagitis: Secondary | ICD-10-CM | POA: Diagnosis present

## 2017-09-25 DIAGNOSIS — N390 Urinary tract infection, site not specified: Secondary | ICD-10-CM | POA: Diagnosis present

## 2017-09-25 DIAGNOSIS — R0902 Hypoxemia: Secondary | ICD-10-CM | POA: Diagnosis present

## 2017-09-25 DIAGNOSIS — E872 Acidosis: Secondary | ICD-10-CM | POA: Diagnosis present

## 2017-09-25 DIAGNOSIS — I129 Hypertensive chronic kidney disease with stage 1 through stage 4 chronic kidney disease, or unspecified chronic kidney disease: Secondary | ICD-10-CM | POA: Diagnosis present

## 2017-09-25 DIAGNOSIS — J189 Pneumonia, unspecified organism: Secondary | ICD-10-CM | POA: Diagnosis present

## 2017-09-25 DIAGNOSIS — B9620 Unspecified Escherichia coli [E. coli] as the cause of diseases classified elsewhere: Secondary | ICD-10-CM | POA: Diagnosis present

## 2017-09-25 DIAGNOSIS — Z66 Do not resuscitate: Secondary | ICD-10-CM | POA: Diagnosis present

## 2017-09-25 DIAGNOSIS — F0390 Unspecified dementia without behavioral disturbance: Secondary | ICD-10-CM | POA: Diagnosis present

## 2017-09-25 DIAGNOSIS — D649 Anemia, unspecified: Secondary | ICD-10-CM | POA: Diagnosis present

## 2017-09-25 DIAGNOSIS — A419 Sepsis, unspecified organism: Secondary | ICD-10-CM | POA: Diagnosis not present

## 2017-09-25 DIAGNOSIS — E1122 Type 2 diabetes mellitus with diabetic chronic kidney disease: Secondary | ICD-10-CM | POA: Diagnosis present

## 2017-09-25 DIAGNOSIS — J690 Pneumonitis due to inhalation of food and vomit: Secondary | ICD-10-CM | POA: Diagnosis present

## 2017-09-25 DIAGNOSIS — M810 Age-related osteoporosis without current pathological fracture: Secondary | ICD-10-CM | POA: Diagnosis present

## 2017-09-25 LAB — COMPREHENSIVE METABOLIC PANEL
ALBUMIN: 3.3 g/dL — AB (ref 3.5–5.0)
ALT: 21 U/L (ref 0–44)
AST: 22 U/L (ref 15–41)
Alkaline Phosphatase: 73 U/L (ref 38–126)
Anion gap: 11 (ref 5–15)
BUN: 17 mg/dL (ref 8–23)
CHLORIDE: 108 mmol/L (ref 98–111)
CO2: 24 mmol/L (ref 22–32)
Calcium: 9.5 mg/dL (ref 8.9–10.3)
Creatinine, Ser: 0.99 mg/dL (ref 0.44–1.00)
GFR calc Af Amer: 55 mL/min — ABNORMAL LOW (ref >60)
GFR, EST NON AFRICAN AMERICAN: 47 mL/min — AB (ref >60)
Glucose, Bld: 175 mg/dL — ABNORMAL HIGH (ref 70–99)
POTASSIUM: 4.4 mmol/L (ref 3.5–5.1)
Sodium: 143 mmol/L (ref 135–145)
Total Bilirubin: 0.5 mg/dL (ref 0.3–1.2)
Total Protein: 6.3 g/dL — ABNORMAL LOW (ref 6.5–8.1)

## 2017-09-25 LAB — URINALYSIS, ROUTINE W REFLEX MICROSCOPIC
Bilirubin Urine: NEGATIVE
Glucose, UA: NEGATIVE mg/dL
HGB URINE DIPSTICK: NEGATIVE
Ketones, ur: NEGATIVE mg/dL
Nitrite: NEGATIVE
PH: 5 (ref 5.0–8.0)
Protein, ur: 30 mg/dL — AB
SPECIFIC GRAVITY, URINE: 1.018 (ref 1.005–1.030)

## 2017-09-25 LAB — CBC WITH DIFFERENTIAL/PLATELET
ABS IMMATURE GRANULOCYTES: 0 10*3/uL (ref 0.0–0.1)
BASOS ABS: 0 10*3/uL (ref 0.0–0.1)
BASOS PCT: 0 %
Eosinophils Absolute: 0.1 10*3/uL (ref 0.0–0.7)
Eosinophils Relative: 1 %
HCT: 37.3 % (ref 36.0–46.0)
Hemoglobin: 11.6 g/dL — ABNORMAL LOW (ref 12.0–15.0)
IMMATURE GRANULOCYTES: 0 %
LYMPHS PCT: 9 %
Lymphs Abs: 0.9 10*3/uL (ref 0.7–4.0)
MCH: 31.6 pg (ref 26.0–34.0)
MCHC: 31.1 g/dL (ref 30.0–36.0)
MCV: 101.6 fL — AB (ref 78.0–100.0)
MONOS PCT: 7 %
Monocytes Absolute: 0.7 10*3/uL (ref 0.1–1.0)
NEUTROS ABS: 8 10*3/uL — AB (ref 1.7–7.7)
NEUTROS PCT: 83 %
PLATELETS: 256 10*3/uL (ref 150–400)
RBC: 3.67 MIL/uL — ABNORMAL LOW (ref 3.87–5.11)
RDW: 14.1 % (ref 11.5–15.5)
WBC: 9.6 10*3/uL (ref 4.0–10.5)

## 2017-09-25 LAB — MRSA PCR SCREENING: MRSA by PCR: NEGATIVE

## 2017-09-25 LAB — I-STAT CG4 LACTIC ACID, ED
LACTIC ACID, VENOUS: 2.25 mmol/L — AB (ref 0.5–1.9)
Lactic Acid, Venous: 2.05 mmol/L (ref 0.5–1.9)

## 2017-09-25 LAB — CBG MONITORING, ED: GLUCOSE-CAPILLARY: 160 mg/dL — AB (ref 70–99)

## 2017-09-25 LAB — TROPONIN I: Troponin I: <0.03 ng/mL (ref <0.03)

## 2017-09-25 LAB — LACTIC ACID, PLASMA: Lactic Acid, Venous: 1.4 mmol/L (ref 0.5–1.9)

## 2017-09-25 LAB — I-STAT TROPONIN, ED: TROPONIN I, POC: 0.06 ng/mL (ref 0.00–0.08)

## 2017-09-25 MED ORDER — VANCOMYCIN HCL IN DEXTROSE 1-5 GM/200ML-% IV SOLN: 1000.0000 mg | INTRAVENOUS | Status: DC

## 2017-09-25 MED ORDER — ACETAMINOPHEN 325 MG PO TABS: 650.0000 mg | ORAL_TABLET | Freq: Four times a day (QID) | ORAL | Status: DC | PRN

## 2017-09-25 MED ORDER — RISPERIDONE 0.5 MG PO TABS
0.5000 mg | ORAL_TABLET | Freq: Three times a day (TID) | ORAL | Status: DC | PRN
  Filled 2017-09-25: qty 1

## 2017-09-25 MED ORDER — ENOXAPARIN SODIUM 40 MG/0.4ML ~~LOC~~ SOLN
40.0000 mg | SUBCUTANEOUS | Status: DC
  Administered 2017-09-25 – 2017-09-27 (×3): 40 mg via SUBCUTANEOUS
  Filled 2017-09-25 (×3): qty 0.4

## 2017-09-25 MED ORDER — SODIUM CHLORIDE 0.9 % IV SOLN
Freq: Once | INTRAVENOUS | Status: AC
  Administered 2017-09-25: 07:00:00 via INTRAVENOUS

## 2017-09-25 MED ORDER — ACETAMINOPHEN 650 MG RE SUPP
650.0000 mg | Freq: Four times a day (QID) | RECTAL | Status: DC | PRN
  Administered 2017-09-27 (×3): 650 mg via RECTAL
  Filled 2017-09-25 (×3): qty 1

## 2017-09-25 MED ORDER — PIPERACILLIN-TAZOBACTAM 3.375 G IVPB 30 MIN
3.3750 g | Freq: Once | INTRAVENOUS | Status: AC
  Administered 2017-09-25: 3.375 g via INTRAVENOUS
  Filled 2017-09-25: qty 50

## 2017-09-25 MED ORDER — PIPERACILLIN-TAZOBACTAM 3.375 G IVPB
3.3750 g | Freq: Three times a day (TID) | INTRAVENOUS | Status: DC
  Administered 2017-09-25 – 2017-09-26 (×4): 3.375 g via INTRAVENOUS
  Filled 2017-09-25 (×4): qty 50

## 2017-09-25 MED ORDER — VALPROATE SODIUM 500 MG/5ML IV SOLN
125.0000 mg | Freq: Three times a day (TID) | INTRAVENOUS | Status: DC
  Administered 2017-09-25 – 2017-09-27 (×7): 125 mg via INTRAVENOUS
  Filled 2017-09-25 (×9): qty 1.25

## 2017-09-25 MED ORDER — ALBUTEROL SULFATE (2.5 MG/3ML) 0.083% IN NEBU
2.5000 mg | INHALATION_SOLUTION | Freq: Four times a day (QID) | RESPIRATORY_TRACT | Status: DC | PRN
  Administered 2017-09-26: 2.5 mg via RESPIRATORY_TRACT
  Filled 2017-09-25: qty 3

## 2017-09-25 MED ORDER — VANCOMYCIN HCL IN DEXTROSE 1-5 GM/200ML-% IV SOLN
1000.0000 mg | Freq: Once | INTRAVENOUS | Status: AC
  Administered 2017-09-25: 1000 mg via INTRAVENOUS
  Filled 2017-09-25: qty 200

## 2017-09-25 NOTE — ED Notes (Signed)
Report given to Nadine, RN.

## 2017-09-25 NOTE — H&P (Signed)
Date: 09/25/2017               Patient Name:  Pamela Ramirez MRN: [id number]  DOB: Nov 03, 1921 Age / Sex: 84 y.o., female   PCP: Ron Parker, MD         Medical Service: Internal Medicine Teaching Service         Attending Physician: Dr. Burns Spain, MD    First Contact: Dr. Dortha Schwalbe Pager: [phone]  Second Contact: Dr. Samuella Cota Pager: [phone]       After Hours (After 5p/  First Contact Pager: [phone]  weekends / holidays): Second Contact Pager: [phone]   Chief Complaint: Altered mental status, hypoxia, hypotension  History of Present Illness:   Pamela Ramirez is nonverbal at baseline.  History obtained from chart review, discussion with emergency department attending and family.  Pamela Ramirez is a 84 year old African-American female with past medical history significant for advanced dementia, hypertension, impaired fasting glucose, anemia and GERD who presented to the emergency department via EMS with altered mental status, intermittent hypoxia and hypertension.  Patient lives at a skilled nursing facility [facility]) and per EMS documentation, her nurse recognized this morning on her rounds that patient was " taking short breaths."  On initial assessment by EMS, she was hypoxic in the 70s, tachycardic, normotensive with BP 110/56.  Physical exam positive for rhonchi.  During assessment by the internal medicine team, patient continued to be nonverbal but responded to painful stimuli.  She did not appear to be in distress. Per patients Pamela Ramirez/family at bedside she has been staying at the skilled nursing facility for 3 years and has had frequent falls since admission.  They have little information as to the care she receives at the facility. This morning they received information from patient's nurse that she was more lethargic and was foaming at the mouth.  There were no reports of rigorous movement of extremities, incontinence or eyes rolling.  Family reports that mother  usually ruminates on food, pills and "foaming" could be due to ruminated dietary material. Also, Pamela Ramirez reports that mother pointed to her vaginal area at the time of urine collection in the ED suggesting possible dysuria.  ED course:  Afebrile. initial BP of 95/62 with subsequent recording of 86/43, started on maintenance normal saline infusion of 146mL/hr.  Satting at 100% on a nonrebreather mask but desats with movement. Lactic acidosis of 2.05 <-- 2.25.  Urinalysis was positive for's small leukocytes and rare bacteria.  CBC was negative for leukocytosis.  Chest x-ray showed no acute cardiopulmonary abnormalities.  She was empirically started on Zosyn 3.375 g and vancomycin 1000 mg.   Pamela Ramirez and Pamela Ramirez-- Cordelia Pen and Louie Boston -- [phone] // [phone]   Meds:  Current Meds  Medication Sig  . acetaminophen (TYLENOL) 500 MG tablet Take 500 mg by mouth every 4 (four) hours as needed for mild pain, moderate pain, fever or headache.  Marland Kitchen acetaminophen (TYLENOL) 650 MG CR tablet Take 650 mg by mouth 3 (three) times daily.  Marland Kitchen alum & mag hydroxide-simeth (MAALOX/MYLANTA) 200-200-20 MG/5ML suspension Take 30 mLs by mouth every 6 (six) hours as needed for indigestion or heartburn.  Marland Kitchen amLODipine (NORVASC) 5 MG tablet Take 1 tablet (5 mg total) by mouth daily.  . divalproex (DEPAKOTE SPRINKLE) 125 MG capsule Take 250 mg by mouth 3 (three) times daily.   Marland Kitchen guaifenesin (ROBITUSSIN) 100 MG/5ML syrup Take 200 mg by mouth every 6 (six) hours as needed for cough.  . loperamide (IMODIUM)  2 MG capsule Take 2 mg by mouth as needed for diarrhea or loose stools.   . magnesium hydroxide (MILK OF MAGNESIA) 400 MG/5ML suspension Take 30 mLs by mouth at bedtime as needed for mild constipation or moderate constipation.  Marland Kitchen. neomycin-bacitracin-polymyxin (NEOSPORIN) [phone] ointment Apply 1 application topically as needed (for minor skin tears/abrasions).   . risperiDONE (RISPERDAL) 0.5 MG tablet Take 0.5 mg by  mouth 3 (three) times daily as needed (for agitation).  . Skin Protectants, Misc. (MINERIN) CREA Apply 1 application topically 2 (two) times daily.      Allergies: Allergies as of 09/25/2017  . (No Known Allergies)   Past Medical History:  Diagnosis Date  . Anxiety   . Chronic kidney disease   . Dementia   . Diabetes type 2, controlled (HCC)   . GERD (gastroesophageal reflux disease)   . Osteoporosis     Family History:  None per patient  Social History:  Non-contributory Pamela Ramirez reports that mom used tobacco products and drank ETOH in the past but last use was 50 years ago  Review of Systems: A complete ROS was negative except as per HPI.   Physical Exam: Blood pressure 116/68, pulse 79, temperature 97.8 F (36.6 C), temperature source Rectal, resp. rate 14, height 5\' 5"  (1.651 m), weight 160 lb (72.6 kg), SpO2 98 %.   Constitutional: Lying in bed with both arms on chest, appears to be in no distress, nonverbal but responds to name, ruminates HEENT: atraumatic, normocephalic Eyes: Nonicteric Cardiovascular: S1, S2 heard.  No additional heart sounds appreciated, no murmurs, gallops, rubs Respiratory: Clear to auscultation with exception of rhonchi at the left lower base posteriorly Abdomen: Soft, nondistended, nontender to palpation.  Lower abdomen-nontender to palpation Extremity: 1+ pitting edema bilaterally Neuro: Alert and oriented only to name, left lower lip mildly droopy compared to the right, frequent mastication Skin: No rashes noted   EKG: personally reviewed my interpretation is sinus tachycardia CXR: personally reviewed my interpretation is no evidence of pulmonary infiltrates or opacities  Assessment & Plan by Problem: Active Problems:   Hypoxia   [name] is a 84 year old African-American female with past medical history significant for advanced dementia- nonverbal and nonambulatory at baseline, hypertension, impaired fasting glucose, anemia and  GERD who presented to the emergency department via EMS with altered mental status, intermittent hypoxia and hypertension.  Sepsis secondary to presumed UTI Cystitis On arrival to the ED, she remained afebrile but was hypotensive with BP range of 86-95/43-62, tachypneic with range 15-30, tachycardic to high 90s. Possible dysuria per Pamela Ramirez. Urinalysis revealed small leukocytes and rare bacteria, negative nitrites.  Lactic acid was elevated to 2.25 with repeat of 2.05.  CBC was negative for leukocytosis.  Physical exam elicit any lower abdominal tenderness.  Was empirically started on Zosyn 3.375 g and vancomycin 1000 mg.  -On maintenance normal saline at 100 mL/h and NS 2010mL/h -Discontinue Vancomycin -Continue Zosyn 3.375g -Blood culture: Pending -Urine culture: Pending -Continuous pulse oximetry -Labs: CBC, BMP -Repeat lactic acid   Hypoxia Presumed community-acquired pneumonia vs atypical pneumonia vs aspiration pneumonia  In the setting of positional intermittent hypoxia and physical exam findings of rhonchi at the left lower lobe posteriorly. Initial chest x-ray was unremarkable given the moderate susceptibility for possible pulmonary insult we will repeat chest x-ray to guide further assessment -Continue Zosyn 3.375 g ; Will consider switching to Unasyn based on findings on repeat CXR -Repeat CXR in the a.m. -Continue to monitor pulse oximetry   AMS Advanced  dementia ?Silent stroke Reported to the ED with decreased mental status from baseline, possible foaming at the mouth and history of frequent falls in the skilled nursing facility.  Physical exam findings of mild left lower lip droopiness however family reports this is an old finding. -CT head without contrast ordered -Will continue to monitor mental status   Hypertension -Currently stable 113/6.   -Hypotensive on admission which responded appropriately with fluid resuscitation with NS 110 mL/h -Her antihypertensive  (Amlodipine 5 mg) was recently discontinued per Pamela Ramirez due to recent lower blood pressure readings. -Hold blood pressure meds -Continue to monitor   Anemia -Hgb of 11.6, increased MCV of 101.6 -Possible B12 deficiency anemia given limited diet with pureed and decreased p.o intake -Will continue to monitor    FEN: NPO, replace electrolytes as needed, NS VTE prophylaxis: Lovenox 40 mg SQ CODE STATUS: DNR  Dispo: Admit patient to observation with expected length of stay less than 2 nights.  Signed: Yvette Rack, MD 09/25/2017, 11:11 AM  Pager: [phone]

## 2017-09-25 NOTE — Progress Notes (Signed)
Pharmacy Antibiotic Note  Pamela Ramirez is a 84 y.o. female admitted on 09/25/2017 with AMS.  Pharmacy has been consulted for vancomycin and Zosyn dosing for sepsis.  Patient already received first doses of antibiotics.  SCr 0.99, CrCL 35 ml/min, afebrile, WBC WNL, LA 2.25.   Plan: Vanc 1gm IV Q24H Zosyn EID 3.375gm IV Q8H Monitor renal fxn, clinical progress, vanc trough as indicated   Height: 5\' 5"  (165.1 cm) Weight: 160 lb (72.6 kg) IBW/kg (Calculated) : 57  Temp (24hrs), Avg:97.8 F (36.6 C), Min:97.8 F (36.6 C), Max:97.8 F (36.6 C)  Recent Labs  Lab 09/25/17 0654 09/25/17 0708  WBC 9.6  --   CREATININE 0.99  --   LATICACIDVEN  --  2.25*    Estimated Creatinine Clearance: 34.7 mL/min (by C-G formula based on SCr of 0.99 mg/dL).    No Known Allergies   Vanc 7/14 >> Zosyn 7/14 >>  7/14 UCx -  7/14 BCx -    Shawn Dannenberg D. Laney Potashang, PharmD, BCPS, BCCCP 09/25/2017, 9:13 AM

## 2017-09-25 NOTE — ED Notes (Signed)
[facility]-- is nonambulatory and nonverbal.

## 2017-09-25 NOTE — ED Notes (Signed)
Pt off O2-- pt comfortable, awake- responding to stimuli.

## 2017-09-25 NOTE — ED Provider Notes (Signed)
[place] EMERGENCY DEPARTMENT Provider Note   CSN: [id number] Arrival date & time: 09/25/17  7711     History   Chief Complaint Chief Complaint  Patient presents with  . Altered Mental Status    HPI Pamela Ramirez is a 84 y.o. female with history of dementia is here from [facility] by EMS for altered mental status.  EMS unable to obtain report from SNF staff, unknown LSW or events leading to ER visit.  EMS found patient to be hypoxic in the 70s, tachycardic, BP 110/56.  On arrival her systolic blood pressure is 95.  Level 5 caveat due to baseline dementia.  Patient withdraws to pain and resists any movement.  Dr. Dina Rich met with family at bedside who confirmed DNR, they would want to try to find some answers, in agreement with antibiotics.   HPI  Past Medical History:  Diagnosis Date  . Anxiety   . Chronic kidney disease   . Dementia   . Diabetes type 2, controlled ([place])   . GERD (gastroesophageal reflux disease)   . Osteoporosis     Patient Active Problem List   Diagnosis Date Noted  . Hypoxia 09/25/2017  . Acute encephalopathy 01/26/2015  . Dyslipidemia 01/26/2015  . CKD (chronic kidney disease) stage 3, GFR 30-59 ml/min (HCC) 01/26/2015  . Anemia of chronic kidney failure 01/26/2015  . Sepsis ([place]) 01/25/2015  . Pulmonary embolism ([place]) 05/12/2011  . Dementia 05/12/2011    History reviewed. No pertinent surgical history.   OB History   None      Home Medications    Prior to Admission medications   Medication Sig Start Date End Date Taking? Authorizing Provider  acetaminophen (TYLENOL) 500 MG tablet Take 500 mg by mouth every 4 (four) hours as needed for mild pain, moderate pain, fever or headache.   Yes [provider]  acetaminophen (TYLENOL) 650 MG CR tablet Take 650 mg by mouth 3 (three) times daily.   Yes [provider]  alum & mag hydroxide-simeth (MAALOX/MYLANTA) 200-200-20 MG/5ML suspension Take 30 mLs  by mouth every 6 (six) hours as needed for indigestion or heartburn.   Yes [provider]  amLODipine (NORVASC) 5 MG tablet Take 1 tablet (5 mg total) by mouth daily. 01/27/15  Yes Robbie Lis, MD  divalproex (DEPAKOTE SPRINKLE) 125 MG capsule Take 250 mg by mouth 3 (three) times daily.    Yes [provider]  guaifenesin (ROBITUSSIN) 100 MG/5ML syrup Take 200 mg by mouth every 6 (six) hours as needed for cough.   Yes [provider]  loperamide (IMODIUM) 2 MG capsule Take 2 mg by mouth as needed for diarrhea or loose stools.    Yes [provider]  magnesium hydroxide (MILK OF MAGNESIA) 400 MG/5ML suspension Take 30 mLs by mouth at bedtime as needed for mild constipation or moderate constipation.   Yes [provider]  neomycin-bacitracin-polymyxin (NEOSPORIN) [phone] ointment Apply 1 application topically as needed (for minor skin tears/abrasions).    Yes [provider]  risperiDONE (RISPERDAL) 0.5 MG tablet Take 0.5 mg by mouth 3 (three) times daily as needed (for agitation).   Yes [provider]  Skin Protectants, Misc. (MINERIN) CREA Apply 1 application topically 2 (two) times daily.    Yes [provider]  ALPRAZolam (XANAX) 0.25 MG tablet Take 1 tablet (0.25 mg total) by mouth 2 (two) times daily. Patient not taking: Reported on 09/25/2017 01/27/15   Robbie Lis, MD  haloperidol (HALDOL) 0.5 MG tablet Take 1 tablet (0.5 mg total) by mouth at bedtime. Patient not taking: Reported on 09/25/2017 01/27/15   Robbie Lis, MD    Family History History reviewed. No pertinent family history.  Social History Social History   Tobacco Use  . Smoking status: Never Smoker  . Smokeless tobacco: Never Used  Substance Use Topics  . Alcohol use: No  . Drug use: No     Allergies   Patient has no known allergies.   Review of Systems Review of Systems  Unable to perform ROS: Dementia     Physical  Exam Updated Vital Signs BP (!) 107/54   Pulse 80   Temp 97.8 F (36.6 C) (Rectal)   Resp 17   Ht 5' 5"  (1.651 m)   Wt 72.6 kg (160 lb)   SpO2 94%   BMI 26.63 kg/m   Physical Exam  Constitutional: She appears well-developed and well-nourished. No distress.  In no distress. Withdraws to pain. Intermittently moans and cries.   HENT:  Head: Normocephalic and atraumatic.  Nose: Nose normal.  Dry mucous membranes. No teeth.   Eyes: Pupils are equal, round, and reactive to light. Conjunctivae and EOM are normal.  Neck: Normal range of motion.  Cardiovascular: Normal rate, regular rhythm and intact distal pulses.  2+ DP and radial pulses bilaterally. No LE edema.   Pulmonary/Chest: Effort normal. She has decreased breath sounds. She has wheezes. She has rhonchi.  On NRB SpO2 100%.  Diminished breath sounds throughout. Faint rhonchi to lower lobes R>L. Faint inspiratory wheezing diffusely.   Abdominal: Soft. Bowel sounds are normal. There is no tenderness.  No obvious discomfort or withdrawal to abdominal palpation  Musculoskeletal: Normal range of motion.  Neurological: She is alert. She is disoriented.  Withdraws to pain. Good strength/resistance in all extremities. Spontaneously moves all four extremities during exam. Unable to perform further neuro exam.   Skin: Skin is warm and dry. Capillary refill takes less than 2 seconds.  No rash,wound, erythema to skin on buttocks, lower back or perianally.   Psychiatric: She has a normal mood and affect. Her behavior is normal.  Nursing note and vitals reviewed.    ED Treatments / Results  Labs (all labs ordered are listed, but only abnormal results are displayed) Labs Reviewed  COMPREHENSIVE METABOLIC PANEL - Abnormal; Notable for the following components:      Result Value   Glucose, Bld 175 (*)    Total Protein 6.3 (*)    Albumin 3.3 (*)    GFR calc non Af Amer 47 (*)    GFR calc Af Amer 55 (*)    All other components within  normal limits  CBC WITH DIFFERENTIAL/PLATELET - Abnormal; Notable for the following components:   RBC 3.67 (*)    Hemoglobin 11.6 (*)    MCV 101.6 (*)    Neutro Abs 8.0 (*)    All other components within normal limits  URINALYSIS, ROUTINE W REFLEX MICROSCOPIC - Abnormal; Notable for the following components:   APPearance HAZY (*)    Protein, ur 30 (*)    Leukocytes, UA SMALL (*)    Bacteria, UA RARE (*)    All other components within normal limits  CBG MONITORING, ED - Abnormal; Notable for the following components:   Glucose-Capillary 160 (*)    All other components within normal limits  I-STAT CG4 LACTIC ACID, ED - Abnormal; Notable for the following components:   Lactic Acid, Venous 2.25 (*)  All other components within normal limits  I-STAT CG4 LACTIC ACID, ED - Abnormal; Notable for the following components:   Lactic Acid, Venous 2.05 (*)    All other components within normal limits  CULTURE, BLOOD (ROUTINE X 2)  CULTURE, BLOOD (ROUTINE X 2)  URINE CULTURE  TROPONIN I  I-STAT TROPONIN, ED  I-STAT TROPONIN, ED    EKG EKG Interpretation  Date/Time:  Sunday September 25 2017 06:51:42 EDT Ventricular Rate:  103 PR Interval:    QRS Duration: 117 QT Interval:  363 QTC Calculation: 476 R Axis:   -70 Text Interpretation:  Sinus tachycardia Incomplete RBBB and LAFB Low voltage, precordial leads No significant change since last tracing Confirmed by Thayer Jew [phone]) on 09/25/2017 6:57:23 AM   Radiology Dg Chest Portable 1 View  Result Date: 09/25/2017 CLINICAL DATA:  Altered level of consciousness and sepsis. EXAM: PORTABLE CHEST 1 VIEW COMPARISON:  09/12/2015 FINDINGS: Normal heart size. Aortic atherosclerosis noted. There is no pleural effusion or edema identified. No airspace opacities identified. IMPRESSION: 1. No acute cardiopulmonary abnormalities. 2.  Aortic Atherosclerosis (ICD10-I70.0). Electronically Signed   By: Kerby Moors M.D.   On: 09/25/2017 07:33     Procedures Procedures (including critical care time)  Medications Ordered in ED Medications  vancomycin (VANCOCIN) IVPB 1000 mg/200 mL premix (has no administration in time range)  piperacillin-tazobactam (ZOSYN) IVPB 3.375 g (has no administration in time range)  0.9 %  sodium chloride infusion ( Intravenous New Bag/Given 09/25/17 0700)  piperacillin-tazobactam (ZOSYN) IVPB 3.375 g (0 g Intravenous Stopped 09/25/17 0739)  vancomycin (VANCOCIN) IVPB 1000 mg/200 mL premix (0 mg Intravenous Stopped 09/25/17 0852)  0.9 %  sodium chloride infusion ( Intravenous New Bag/Given 09/25/17 0711)     Initial Impression / Assessment and Plan / ED Course  I have reviewed the triage vital signs and the nursing notes.  Pertinent labs & imaging results that were available during my care of the patient were reviewed by me and considered in my medical decision making (see chart for details).  Clinical Course as of Sep 25 1040  Sun Sep 25, 2017  0732 Lactic Acid, Venous(!!): 2.25 [CG]  0733 Sinus tach   EKG 12-Lead [CG]  0908 Leukocytes, UA(!): SMALL [CG]  0908 WBC, UA: 21-50 [CG]  0908 Bacteria, UA(!): RARE [CG]    Clinical Course User Index [CG] Kinnie Feil, PA-C   Exam and history very limited given lack of report from SNF facility and EMS. Unknown LSN.    Meets SIRS criteria based on EMS vital signs and initial VS on arrival with hypotension, tachycardia. Sepsis code activated given concern for most likely infectious source UTI, PNA, skin.  Broad spectrum antibiotics ordered, gentle hydration started given mild rhonchi in lower lobes. She looks dehydrated.   0945: re-evaluated pt she is no longer requiring NRB however desaturates to 88% with movement, does compensate to above 95% at rest. Lactic acid 2.25, EKG with sinus tach, urine with small leuk, 21-50 WBC, rare bacteria.  Maybe early UTI, culture sent. BP remains soft. She does not look fluid overloaded, no edema on CXR. No h/o CHF.   Pending hospitalist consult for admission for hypotension, intermittent hypoxia possible early UTI.  Family at bedside in agreement. Pt shard with Dr. Dina Rich.   Final Clinical Impressions(s) / ED Diagnoses   Discussed with internal medicine who will admit.  Final diagnoses:  Hypoxia    ED Discharge Orders    None  Kinnie Feil, PA-C 09/25/17 1042    Merryl Hacker, MD 09/26/17 [phone]

## 2017-09-25 NOTE — Progress Notes (Signed)
09/25/17 Patient is alert to self, non ambulatory. Patient have scab on knees, heels dry. Patient sacrum no breakdown or excoriation. Patient is incontinent of bowel and bladder. [facility] Laser CenterNadine Ky Moskowitz RN.

## 2017-09-25 NOTE — ED Triage Notes (Signed)
[facility] status, unknown last known well. [facility] received no report from SNF, so unknown LSW or circumstances of events today. Initial spo2 70%, placed on nonrebreather and o2 sats up to 100%. Pt has hx dementia but unknown baseline per EMS. Bp per EMS 110/56

## 2017-09-25 NOTE — ED Notes (Signed)
Daughter and granddaughter-- Cordelia Penthel and Louie BostonKeysha -- [phone]  [phone]

## 2017-09-26 ENCOUNTER — Observation Stay (HOSPITAL_COMMUNITY): Payer: Medicare Other

## 2017-09-26 DIAGNOSIS — Z79899 Other long term (current) drug therapy: Secondary | ICD-10-CM

## 2017-09-26 DIAGNOSIS — I129 Hypertensive chronic kidney disease with stage 1 through stage 4 chronic kidney disease, or unspecified chronic kidney disease: Secondary | ICD-10-CM | POA: Diagnosis present

## 2017-09-26 DIAGNOSIS — F0390 Unspecified dementia without behavioral disturbance: Secondary | ICD-10-CM

## 2017-09-26 DIAGNOSIS — N390 Urinary tract infection, site not specified: Secondary | ICD-10-CM | POA: Diagnosis present

## 2017-09-26 DIAGNOSIS — A419 Sepsis, unspecified organism: Secondary | ICD-10-CM | POA: Diagnosis present

## 2017-09-26 DIAGNOSIS — J189 Pneumonia, unspecified organism: Secondary | ICD-10-CM | POA: Diagnosis present

## 2017-09-26 DIAGNOSIS — Z66 Do not resuscitate: Secondary | ICD-10-CM | POA: Diagnosis present

## 2017-09-26 DIAGNOSIS — B9620 Unspecified Escherichia coli [E. coli] as the cause of diseases classified elsewhere: Secondary | ICD-10-CM | POA: Diagnosis present

## 2017-09-26 DIAGNOSIS — R0902 Hypoxemia: Secondary | ICD-10-CM | POA: Diagnosis present

## 2017-09-26 DIAGNOSIS — M810 Age-related osteoporosis without current pathological fracture: Secondary | ICD-10-CM | POA: Diagnosis present

## 2017-09-26 DIAGNOSIS — D649 Anemia, unspecified: Secondary | ICD-10-CM | POA: Diagnosis present

## 2017-09-26 DIAGNOSIS — J690 Pneumonitis due to inhalation of food and vomit: Secondary | ICD-10-CM | POA: Diagnosis present

## 2017-09-26 DIAGNOSIS — E1122 Type 2 diabetes mellitus with diabetic chronic kidney disease: Secondary | ICD-10-CM | POA: Diagnosis present

## 2017-09-26 DIAGNOSIS — K219 Gastro-esophageal reflux disease without esophagitis: Secondary | ICD-10-CM | POA: Diagnosis present

## 2017-09-26 DIAGNOSIS — Z86711 Personal history of pulmonary embolism: Secondary | ICD-10-CM | POA: Diagnosis not present

## 2017-09-26 DIAGNOSIS — E872 Acidosis: Secondary | ICD-10-CM | POA: Diagnosis present

## 2017-09-26 DIAGNOSIS — N183 Chronic kidney disease, stage 3 (moderate): Secondary | ICD-10-CM | POA: Diagnosis present

## 2017-09-26 LAB — BLOOD CULTURE ID PANEL (REFLEXED)
ACINETOBACTER BAUMANNII: NOT DETECTED
CANDIDA ALBICANS: NOT DETECTED
CANDIDA KRUSEI: NOT DETECTED
CANDIDA PARAPSILOSIS: NOT DETECTED
Candida glabrata: NOT DETECTED
Candida tropicalis: NOT DETECTED
ESCHERICHIA COLI: NOT DETECTED
Enterobacter cloacae complex: NOT DETECTED
Enterobacteriaceae species: NOT DETECTED
Enterococcus species: NOT DETECTED
Haemophilus influenzae: NOT DETECTED
KLEBSIELLA OXYTOCA: NOT DETECTED
Klebsiella pneumoniae: NOT DETECTED
Listeria monocytogenes: NOT DETECTED
Methicillin resistance: NOT DETECTED
Neisseria meningitidis: NOT DETECTED
PSEUDOMONAS AERUGINOSA: NOT DETECTED
Proteus species: NOT DETECTED
SERRATIA MARCESCENS: NOT DETECTED
STAPHYLOCOCCUS AUREUS BCID: NOT DETECTED
STREPTOCOCCUS PNEUMONIAE: NOT DETECTED
STREPTOCOCCUS PYOGENES: NOT DETECTED
Staphylococcus species: DETECTED — AB
Streptococcus agalactiae: NOT DETECTED
Streptococcus species: NOT DETECTED

## 2017-09-26 LAB — BASIC METABOLIC PANEL
ANION GAP: 7 (ref 5–15)
BUN: 13 mg/dL (ref 8–23)
CHLORIDE: 111 mmol/L (ref 98–111)
CO2: 29 mmol/L (ref 22–32)
Calcium: 9.4 mg/dL (ref 8.9–10.3)
Creatinine, Ser: 0.97 mg/dL (ref 0.44–1.00)
GFR calc Af Amer: 56 mL/min — ABNORMAL LOW (ref >60)
GFR calc non Af Amer: 48 mL/min — ABNORMAL LOW (ref >60)
GLUCOSE: 96 mg/dL (ref 70–99)
POTASSIUM: 3.8 mmol/L (ref 3.5–5.1)
Sodium: 147 mmol/L — ABNORMAL HIGH (ref 135–145)

## 2017-09-26 LAB — CBC
HEMATOCRIT: 34.2 % — AB (ref 36.0–46.0)
HEMOGLOBIN: 11 g/dL — AB (ref 12.0–15.0)
MCH: 31.5 pg (ref 26.0–34.0)
MCHC: 32.2 g/dL (ref 30.0–36.0)
MCV: 98 fL (ref 78.0–100.0)
Platelets: 225 10*3/uL (ref 150–400)
RBC: 3.49 MIL/uL — ABNORMAL LOW (ref 3.87–5.11)
RDW: 13.7 % (ref 11.5–15.5)
WBC: 3.9 10*3/uL — ABNORMAL LOW (ref 4.0–10.5)

## 2017-09-26 LAB — VITAMIN B12: VITAMIN B 12: 545 pg/mL (ref 180–914)

## 2017-09-26 MED ORDER — SODIUM CHLORIDE 0.9 % IV SOLN
3.0000 g | Freq: Three times a day (TID) | INTRAVENOUS | Status: DC
  Administered 2017-09-26 – 2017-09-27 (×4): 3 g via INTRAVENOUS
  Filled 2017-09-26 (×6): qty 3

## 2017-09-26 NOTE — Progress Notes (Signed)
  PHARMACY - PHYSICIAN COMMUNICATION CRITICAL VALUE ALERT - BLOOD CULTURE IDENTIFICATION (BCID)  Pamela Ramirez is an 84 y.o. female who presented to [facility] on 09/25/2017 with a chief complaint of sepsis  Assessment:  Likely contaminant  Name of physician (or Provider) Contacted: Dr Dortha SchwalbeAgyei  Current antibiotics: vanc and zosyn  Changes to prescribed antibiotics recommended:  No changes  Results for orders placed or performed during the hospital encounter of 09/25/17  Blood Culture ID Panel (Reflexed) (Collected: 09/25/2017  8:07 AM)  Result Value Ref Range   Enterococcus species NOT DETECTED NOT DETECTED   Listeria monocytogenes NOT DETECTED NOT DETECTED   Staphylococcus species DETECTED (A) NOT DETECTED   Staphylococcus aureus NOT DETECTED NOT DETECTED   Methicillin resistance NOT DETECTED NOT DETECTED   Streptococcus species NOT DETECTED NOT DETECTED   Streptococcus agalactiae NOT DETECTED NOT DETECTED   Streptococcus pneumoniae NOT DETECTED NOT DETECTED   Streptococcus pyogenes NOT DETECTED NOT DETECTED   Acinetobacter baumannii NOT DETECTED NOT DETECTED   Enterobacteriaceae species NOT DETECTED NOT DETECTED   Enterobacter cloacae complex NOT DETECTED NOT DETECTED   Escherichia coli NOT DETECTED NOT DETECTED   Klebsiella oxytoca NOT DETECTED NOT DETECTED   Klebsiella pneumoniae NOT DETECTED NOT DETECTED   Proteus species NOT DETECTED NOT DETECTED   Serratia marcescens NOT DETECTED NOT DETECTED   Haemophilus influenzae NOT DETECTED NOT DETECTED   Neisseria meningitidis NOT DETECTED NOT DETECTED   Pseudomonas aeruginosa NOT DETECTED NOT DETECTED   Candida albicans NOT DETECTED NOT DETECTED   Candida glabrata NOT DETECTED NOT DETECTED   Candida krusei NOT DETECTED NOT DETECTED   Candida parapsilosis NOT DETECTED NOT DETECTED   Candida tropicalis NOT DETECTED NOT DETECTED    Woodfin GanjaSeay, Jacoby Zanni Poteet 09/26/2017  7:29 AM

## 2017-09-26 NOTE — Plan of Care (Signed)
Discussed with family plan of care, pain management and new antibiotics with some teach back displayed

## 2017-09-26 NOTE — Discharge Summary (Signed)
Name: Pamela Ramirez MRN: [id number] DOB: November 29, 1921 84 y.o. PCP: Ron Parker, MD  Date of Admission: 09/25/2017  6:42 AM Date of Discharge: 09/27/2017 Attending Physician: Burns Spain, MD  Discharge Diagnosis: 1.  Sepsis secondary to community-acquired pneumonia vs aspiration pneumonia 2. Asymptomatic bacteriuria 3.  Altered mental status      Advanced dementia  Discharge Medications: Allergies as of 09/27/2017   No Known Allergies     Medication List    STOP taking these medications   amLODipine 5 MG tablet Commonly known as:  NORVASC     TAKE these medications   acetaminophen 500 MG tablet Commonly known as:  TYLENOL Take 500 mg by mouth every 4 (four) hours as needed for mild pain, moderate pain, fever or headache.   acetaminophen 650 MG CR tablet Commonly known as:  TYLENOL Take 650 mg by mouth 3 (three) times daily.   alum & mag hydroxide-simeth 200-200-20 MG/5ML suspension Commonly known as:  MAALOX/MYLANTA Take 30 mLs by mouth every 6 (six) hours as needed for indigestion or heartburn.   amoxicillin-clavulanate 250-62.5 MG/5ML suspension Commonly known as:  AUGMENTIN Take 5 mLs (250 mg total) by mouth 2 (two) times daily for 5 days.   divalproex 125 MG capsule Commonly known as:  DEPAKOTE SPRINKLE Take 250 mg by mouth 3 (three) times daily.   guaifenesin 100 MG/5ML syrup Commonly known as:  ROBITUSSIN Take 200 mg by mouth every 6 (six) hours as needed for cough.   loperamide 2 MG capsule Commonly known as:  IMODIUM Take 2 mg by mouth as needed for diarrhea or loose stools.   magnesium hydroxide 400 MG/5ML suspension Commonly known as:  MILK OF MAGNESIA Take 30 mLs by mouth at bedtime as needed for mild constipation or moderate constipation.   MINERIN Crea Apply 1 application topically 2 (two) times daily.   neomycin-bacitracin-polymyxin [phone] ointment Apply 1 application topically as needed (for minor skin tears/abrasions).     risperiDONE 0.5 MG tablet Commonly known as:  RISPERDAL Take 0.5 mg by mouth 3 (three) times daily as needed (for agitation).       Disposition and follow-up:   Ms.Veona D Ryle was discharged from [facility] in [place] condition.  At the hospital follow up visit please address:  1.  Sepsis secondary to community-acquired pneumonia vs aspiration pneumonia -presented with hypoxia, hypertension and altered mental status in the setting of possible left lower lobe infiltrate on chest x-ray and physical exam findings of rhonchi at the left lower lobe  2.  Labs / imaging needed at time of follow-up: n/a  3.  Pending labs/ test needing follow-up: Blood cultures 09/25/2017   Follow-up Appointments:   Hospital Course by problem list: 1.  Sepsis (resolved) secondary to Pneumonia (either community-acquired pneumonia vs aspiration pneumonia)  [name] is a 84 year old African-American female with past medical history significant for advanced dementia- nonverbal and nonambulatory at baseline, hypertension, impaired fasting glucose, anemia and GERD who presented to the emergency department via EMS with altered mental status, intermittent hypoxia and hypotension. On arrival to the ED, she remained afebrile but was hypotensive with BP range of 86-95/43-62, tachypneic with range 15-30, tachycardic to high 90s. Lactic acid was elevated to 2.25 with repeat of 2.05.  CBC was negative for leukocytosis. Physical Exams noticeable for expiratory rhonchi posteriorly in the left lower lobe.  Was empirically started on Zosyn 3.375 g and vancomycin 1000 mg.   Subsequently, lactic acidosis resolved.  Her vitals normalized.  1 of 4 blood cultures were positive for coag negative staph indicating possible contaminant.  Repeat chest x-ray showed pulmonary infiltrates at the left lower lobe.  Vancomycin 1000 mg/25800mL premix for 1 day and Zosyn 3.375g for 2 days were discontinued. She received one dose of  IV Unasyn 3g which was transitioned to Augmentin 500/2610mL suspension to be completed for 5 days.  She continued to remain afebrile on the subsequent hospital days.   2.  Asymptomatic bacteriuria On presentation, urinalysis revealed small leukocytes and rare bacteria, negative nitrites.  Follow-up urine culture revealed 50,000 colonies of E. coli.  We were not able to assess patient symptomology as she remained nonverbal and Physical exam did not elicit any lower abdominal tenderness.  UTI was less likely but however Unasyn would provide coverage for possible urinary tract infection.  3.  Altered mental status      Advanced dementia [name] is nonverbal and nonambulatory at baseline.  At her assisted living facility she takes risperidone 0.5 mg 3 times a day as needed for agitation and divalproex 250 mg 3 times a day.  Discharge Vitals:   BP 136/75 (BP Location: Left Wrist)   Pulse 92   Temp 99.6 F (37.6 C) (Oral)   Resp 16   Ht 5\' 3"  (1.6 m)   Wt 147 lb 7.8 oz (66.9 kg)   SpO2 96%   BMI 26.13 kg/m   Pertinent Labs, Studies, and Procedures:   CBC Latest Ref Rng & Units 09/27/2017 09/26/2017 09/25/2017  WBC 4.0 - 10.5 K/uL 5.4 3.9(L) 9.6  Hemoglobin 12.0 - 15.0 g/dL 11.7(L) 11.0(L) 11.6(L)  Hematocrit 36.0 - 46.0 % 36.1 34.2(L) 37.3  Platelets 150 - 400 K/uL 227 225 256   BMET    Component Value Date/Time   NA 143 09/27/2017 0214   K 3.7 09/27/2017 0214   CL 107 09/27/2017 0214   CO2 27 09/27/2017 0214   GLUCOSE 106 (H) 09/27/2017 0214   BUN 13 09/27/2017 0214   CREATININE 0.99 09/27/2017 0214   CALCIUM 9.4 09/27/2017 0214   GFRNONAA 47 (L) 09/27/2017 0214   GFRAA 55 (L) 09/27/2017 0214   CXR 09/26/2017: Left lower lobe infiltrate.  CT head without contrast IMPRESSION: No acute finding by CT. Advanced atrophy, temporal lobe predominant. Chronic small-vessel ischemic changes. Chronic ventriculomegaly felt secondary to ex vacuo enlargement.  Discharge  Instructions: Discharge Instructions    Call MD for:  difficulty breathing, headache or visual disturbances   Complete by:  As directed    Call MD for:  extreme fatigue   Complete by:  As directed    Call MD for:  hives   Complete by:  As directed    Call MD for:  persistant dizziness or light-headedness   Complete by:  As directed    Call MD for:  persistant nausea and vomiting   Complete by:  As directed    Call MD for:  redness, tenderness, or signs of infection (pain, swelling, redness, odor or green/yellow discharge around incision site)   Complete by:  As directed    Call MD for:  severe uncontrolled pain   Complete by:  As directed    Call MD for:  temperature >100.4   Complete by:  As directed    Diet - low sodium heart healthy   Complete by:  As directed    Discharge instructions   Complete by:  As directed    You were seen in the hospital because of an infection  in your lungs. We treated you with antibiotics and will discharge you with additional antibiotics that you'll take for 5 days.   You also had a low blood pressure on admission but this resolved after you received fluids.  STOP taking the Amlidipine (Norvasc) 5mg  for your blood pressure   Increase activity slowly   Complete by:  As directed       Signed: Nyra Market, MD 09/27/2017, 4:18 PM

## 2017-09-26 NOTE — Progress Notes (Addendum)
   Subjective: Hospital day 1  Overnight: No acute events reported  Today, [name] was evaluated at bedside with the present.  Per family member, she slept well overnight and did not seem to be in pain or noticeable difficulty breathing.  Difficult to assess secondary to baseline nonverbal status.  Objective:  Vital signs in last 24 hours: Vitals:   09/25/17 1805 09/25/17 2300 09/26/17 0643 09/26/17 0811  BP: 137/76 117/80  114/62  Pulse: 88 83  84  Resp: 17 18  19   Temp: 98.5 F (36.9 C) 98.2 F (36.8 C)  98.3 F (36.8 C)  TempSrc: Axillary Oral  Oral  SpO2:  96% 95% 95%  Weight: 147 lb 7.8 oz (66.9 kg)     Height: 5\' 3"  (1.6 m)       Physical exams. Constitutional: Lying in bed, responds to name Cardiovascular: RRR, no murmurs, gallops, rubs Respiratory: Inspiratory rhonchi appreciated in the left lower lobe Abdomen: Soft, nontender to palpation, nondistended Extremity: 1+ pitting edema bilaterally, however noticeable improvement of the right lower extremity   CXR 09/26/17  IMPRESSION: Cardiac enlargement without heart failure. Mild bibasilar Atelectasis. Nodular density right lung base doubtful significance.  Assessment/Plan:  Active Problems:   Hypoxia   Pneumonia   [name] is a 84 year old African-American female with past medical history significant for advanced dementia- nonverbal and nonambulatory at baseline, hypertension, anemia and GERD who presented to the emergency department via EMS with altered mental status, intermittent hypoxia and hypotension.   Sepsis 2/2 presumed community acquired pneumonia vs aspiration pneumonia Sepsis has resolved.  Her vitals are currently stable and lactic acidosis has resolved.  There is still noticeable expiratory rhonchi appreciated on auscultation at the left lower lobe posteriorly.  Blood culture was positive for coagulase-negative staph, most likely contaminant.  Repeat chest x-ray showed cardiac enlargement with a  heart failure and mild bibasilar atelectasis.  -Afebrile  -Negative for leukocytosis -Start Unasyn 3 g -A.m. labs: CBC, BMP  ?UTI Initial Urinalysis revealed small leukocytes and rare bacteria, negative nitrites.  Follow-up urine culture showed 50,000 colonies/mL E. Coli.  Given the patient is ?asymptomatic, this is most likely contaminant.  She has already received 2 days of IV Zosyn 3.375 g -Started Unasyn 3 g for respiratory infection and should have enough coverage for her urinary tract infection as well.   AMS Advanced dementia Nonverbal nonambulatory at baseline.  CT head was unremarkable with exception of advanced atrophy of the temporal lobes, Chronic small-vessel ischemic changes, Chronic ventriculomegaly felt secondary to ex vacuo enlargement.  She was evaluated by speech therapy and recommended dysphagia 1 thin liquid diet -Continue risperidone and valproic acid.  Hypertension BP currently stable status post 1 L NS.  We will not initiate antihypertensives at this point -Continue to monitor  Anemia Hemoglobin of 11 today from 11.6 yesterday with an increased MCV.  Remains asymptomatic Normal B12 level of 545   FEN: Thin liquid, replace electrolytes as needed VTE prophylaxis: Lovenox 40 mg SQ CODE STATUS: DNR  Dispo: Admit patient to observation with expected length of stay less than 2 nights.  Pamela Ramirez, Pamela K, MD 09/26/2017, 12:11 PM Pager: [phone]

## 2017-09-26 NOTE — Care Management Note (Signed)
Case Management Note  Patient Details  Name: Pamela Ramirez MRN: [id number] Date of Birth: March 12, 1922  Subjective/Objective:    [facility], presents with hypoxia, nonverbal, nonambulatory, has dementia, for repeat cxr for wheezing, on iv abx.                 Action/Plan: DC back to SNF when medically ready.  Expected Discharge Date:                  Expected Discharge Plan:  Skilled Nursing Facility  In-House Referral:  Clinical Social Work  Discharge planning Services  CM Consult  Post Acute Care Choice:    Choice offered to:     DME Arranged:    DME Agency:     HH Arranged:    HH Agency:     Status of Service:  In process, will continue to follow  If discussed at Long Length of Stay Meetings, dates discussed:    Additional Comments:  Leone Havenaylor, Priscila Bean Clinton, RN 09/26/2017, 1:46 PM

## 2017-09-26 NOTE — Evaluation (Signed)
Clinical/Bedside Swallow Evaluation Patient Details  Name: Pamela Ramirez MRN: [id number] Date of Birth: December 15, 1921  Today's Date: 09/26/2017 Time: SLP Start Time (ACUTE ONLY): 0953 SLP Stop Time (ACUTE ONLY): 1007 SLP Time Calculation (min) (ACUTE ONLY): 14 min  Past Medical History:  Past Medical History:  Diagnosis Date  . Anxiety   . Chronic kidney disease   . Dementia   . Diabetes type 2, controlled (HCC)   . GERD (gastroesophageal reflux disease)   . Osteoporosis    Past Surgical History: History reviewed. No pertinent surgical history. HPI:  Pt is a 84 year old female with past medical history significant for advanced dementia (nonverbal nonambulatory at baseline), hypertension, impaired fasting glucose, anemia and GERD, who presented to the emergency department via EMS with altered mental status, intermittent hypoxia and hypertension. Pt with sepsis secondary to presumed UTI per MD note. PNA also in the differential as pt presented with hypoxia and posterior LLL rhonchi, although CXR without signs of acute infection. CT Head also negative.   Assessment / Plan / Recommendation Clinical Impression  Pt has no overt signs of aspiration given baseline diet textures (purees, thin liquids per son-in-law). She does have reduced bolus awareness, dependence for feeding, audible swallows, and frequent eructation. She consistently swallows 2x per bolus with purees. Although overall she is nonverbal and does not follow commands, she did spontaneously vocalize x1 after PO trials with vocal quality sounding clear. Recommend starting Dys 1 diet and thin liquids, meds crushed in puree, with full supervision during intake. Given concern for possible PNA and cognitive status, will f/u for tolerance. SLP Visit Diagnosis: Dysphagia, oral phase (R13.11)    Aspiration Risk  Mild aspiration risk    Diet Recommendation Dysphagia 1 (Puree);Thin liquid   Liquid Administration via:  Straw;Cup Medication Administration: Crushed with puree Supervision: Staff to assist with self feeding;Full supervision/cueing for compensatory strategies Compensations: Minimize environmental distractions;Slow rate;Small sips/bites Postural Changes: Seated upright at 90 degrees;Remain upright for at least 30 minutes after po intake    Other  Recommendations Oral Care Recommendations: Oral care BID   Follow up Recommendations Skilled Nursing facility      Frequency and Duration min 2x/week  1 week       Prognosis Prognosis for Safe Diet Advancement: Guarded Barriers to Reach Goals: Cognitive deficits;Time post onset      Swallow Study   General HPI: Pt is a 84 year old female with past medical history significant for advanced dementia (nonverbal nonambulatory at baseline), hypertension, impaired fasting glucose, anemia and GERD, who presented to the emergency department via EMS with altered mental status, intermittent hypoxia and hypertension. Pt with sepsis secondary to presumed UTI per MD note. PNA also in the differential as pt presented with hypoxia and posterior LLL rhonchi, although CXR without signs of acute infection. CT Head also negative. Type of Study: Bedside Swallow Evaluation Previous Swallow Assessment: none in chart Diet Prior to this Study: NPO Temperature Spikes Noted: No Respiratory Status: Room air History of Recent Intubation: No Behavior/Cognition: Alert;Doesn't follow directions Oral Cavity Assessment: Other (comment)(difficult to visualize) Oral Care Completed by SLP: No Oral Cavity - Dentition: Edentulous Self-Feeding Abilities: Total assist Patient Positioning: Upright in bed Baseline Vocal Quality: Normal(vocalized x1) Volitional Cough: Cognitively unable to elicit Volitional Swallow: Unable to elicit    Oral/Motor/Sensory Function Overall Oral Motor/Sensory Function: (cognitvely not able to follow commands for assessment)   Ice Chips Ice chips: Not  tested   Thin Liquid Thin Liquid: Impaired Presentation: Straw;Cup Oral Phase Impairments: Poor  awareness of bolus Pharyngeal  Phase Impairments: Other (comments)(audible swallows)    Nectar Thick Nectar Thick Liquid: Not tested   Honey Thick Honey Thick Liquid: Not tested   Puree Puree: Impaired Presentation: Spoon Oral Phase Impairments: Poor awareness of bolus Pharyngeal Phase Impairments: Multiple swallows;Other (comments)(audible swallows)   Solid   GO   Solid: Not tested        Maxcine Hamaiewonsky, Kapil Petropoulos 09/26/2017,10:22 AM  Maxcine HamLaura Paiewonsky, M.A. CCC-SLP [phone]

## 2017-09-26 NOTE — Progress Notes (Signed)
Date: 09/26/2017  Patient name: Pamela Ramirez  Medical record number: [id number]  Date of birth: 11/15/1921   I have seen and evaluated Pamela Ramirez and discussed their care with the Residency Team. Pamela Ramirez is Ramirez 84 yo non verbal, non ambulatory female with dementia residing at [facility]. Info obtained from admitting team and notes and documented in Pamela Ramirez H&P dated 09/25/17.  She was admitted with sepsis from an pneumonia or possibly Ramirez UTI.  She was started on Zosyn and vancomycin.  She has remained afebrile, without Ramirez leukocytosis, her hypoxia has resolved, and her blood pressure has normalized with IV hydration.  Her urine culture returned with 50,000 colonies of E. coli and without confirmation of symptoms, Ramirez UTI is felt to be less likely Ramirez cause of her sepsis.  Her chest x-ray did show Ramirez possible left lower lobe infiltrate and  her exam reveals rhonchi on the left so Ramirez pneumonia, either community-acquired or aspiration, is the most likely etiology of her sepsis.  Since admission and starting antibiotics, she has significantly improved.  Ramirez family member is with her continuously since admission.  PMHx, Fam Hx, and/or Soc Hx : She requires complete assistance with all ADLs.  Vitals:   09/26/17 0643 09/26/17 0811  BP:  114/62  Pulse:  84  Resp:  19  Temp:  98.3 F (36.8 C)  SpO2: 95% 95%  HRRR no MRG L rhonchi in LLL, decent air flow ABD + BS Ext + 1 edema B  WBC 9.6 - 3.9 Na 147 Cr 0.97 LA 2.05 - 1.4 UA sm LE, 0-5 sq epi, 21-50 WBC, neg nitrates  I personally viewed the CXR images and confirmed my reading with the official read. 7/14 : AP semi upright, sig rotation and underpenetration losing L angle. 7/15 AP : overpenetrated sillouette of L inf heart border  I personally viewed the EKG and confirmed my reading with the official read. Sinus, nl axis, RBBB  Assessment and Plan: I have seen and evaluated the patient as outlined above. I agree with the  formulated Assessment and Plan as detailed in the residents' note, with the following changes: Pamela Ramirez is Ramirez 84 yo non verbal, non ambulatory female with dementia residing at [facility] who presented with sepsis secondary to Ramirez left lingular infiltrate.  Her hypotension, hypoxia, lactic acidosis, and altered mental status have all since resolved since starting antibiotics.  Her MRSA PCR screen returned negative so vancomycin has been stopped.  She either has Ramirez community-acquired pneumonia or possibly an aspiration pneumonia and will be narrowed to Unasyn.  Speech therapy has already evaluated the patient and has recommended Ramirez dysphagia 1 diet.  She is already in maximum institutional care, the memory unit, and will return there likely tomorrow on oral antibiotics.  1. Sepsis (resolved) 2/2 PNA, either CAP or aspiration -1 of 4 blood cultures were positive for coag negative staph indicating it was simply Ramirez contaminant.  She has stabilized on antibiotics and we are narrowing them to Unasyn.  Due to the concern for aspiration, speech therapy evaluated the patient and she will be continued on Ramirez dysphagia 1 diet.  2.  Asymptomatic bacteriuria -the patient is nonverbal and unable to give symptomatology.  Plus, her urine culture showed only 50,000 colonies.  Therefore, it is unlikely she has Ramirez UTI.  However, her antibiotics will cover any possible urinary contaminant.  3.  Dementia -she has as needed Risperdal.  Her family  is staying with her around-the-clock.  She is already in the memory unit and therefore, I do not think there is Ramirez higher level of cane.  She does not need PT or OT evaluation and likely will return to her memory unit tomorrow.  Pamela Ramirez, Pamela Govan A, MD 7/15/20191:25 PM

## 2017-09-26 NOTE — Plan of Care (Signed)
Discussed with family plan of care for the evening, pain management and IV medications with some teach back displayed

## 2017-09-26 NOTE — Progress Notes (Signed)
OT Cancellation Note  Patient Details Name: Pamela SharkBeatrice D Morton MRN: [id number] DOB: June 14, 1921   Cancelled Treatment:    Reason Eval/Treat Not Completed: OT screened, no needs identified, will sign off. Prior to discontinuation of OT order, discussed with family (daughter and son-in-law) and pt is functioning at baseline requiring total assistance for ADL. She is minimally arousal but does withdraw to pain at times. No OT needs identified.   Doristine Sectionharity A Vrinda Heckstall, MS OTR/L  Pager: [phone]   Doristine SectionCharity A Langdon Crosson 09/26/2017, 3:41 PM

## 2017-09-27 LAB — BASIC METABOLIC PANEL
Anion gap: 9 (ref 5–15)
BUN: 13 mg/dL (ref 8–23)
CALCIUM: 9.4 mg/dL (ref 8.9–10.3)
CO2: 27 mmol/L (ref 22–32)
Chloride: 107 mmol/L (ref 98–111)
Creatinine, Ser: 0.99 mg/dL (ref 0.44–1.00)
GFR calc Af Amer: 55 mL/min — ABNORMAL LOW (ref >60)
GFR calc non Af Amer: 47 mL/min — ABNORMAL LOW (ref >60)
Glucose, Bld: 106 mg/dL — ABNORMAL HIGH (ref 70–99)
POTASSIUM: 3.7 mmol/L (ref 3.5–5.1)
Sodium: 143 mmol/L (ref 135–145)

## 2017-09-27 LAB — CBC
HEMATOCRIT: 36.1 % (ref 36.0–46.0)
Hemoglobin: 11.7 g/dL — ABNORMAL LOW (ref 12.0–15.0)
MCH: 31.6 pg (ref 26.0–34.0)
MCHC: 32.4 g/dL (ref 30.0–36.0)
MCV: 97.6 fL (ref 78.0–100.0)
Platelets: 227 10*3/uL (ref 150–400)
RBC: 3.7 MIL/uL — ABNORMAL LOW (ref 3.87–5.11)
RDW: 13.9 % (ref 11.5–15.5)
WBC: 5.4 10*3/uL (ref 4.0–10.5)

## 2017-09-27 LAB — URINE CULTURE: Culture: 50000 — AB

## 2017-09-27 MED ORDER — POLYETHYLENE GLYCOL 3350 17 G PO PACK: 17.0000 g | PACK | Freq: Every day | ORAL | Status: DC | PRN

## 2017-09-27 MED ORDER — AMOXICILLIN-POT CLAVULANATE 250-62.5 MG/5ML PO SUSR: 250.0000 mg | Freq: Two times a day (BID) | ORAL | 0 refills | Status: AC

## 2017-09-27 MED ORDER — AMOXICILLIN-POT CLAVULANATE 500-125 MG PO TABS
500.0000 mg | ORAL_TABLET | Freq: Two times a day (BID) | ORAL | Status: DC
  Filled 2017-09-27 (×2): qty 1

## 2017-09-27 MED ORDER — SENNA 8.6 MG PO TABS
1.0000 | ORAL_TABLET | Freq: Every day | ORAL | Status: DC
  Administered 2017-09-27: 8.6 mg via ORAL
  Filled 2017-09-27: qty 1

## 2017-09-27 NOTE — Care Management Note (Signed)
Case Management Note  Patient Details  Name: Pamela Ramirez MRN: [id number] Date of Birth: 11-Oct-1921  Subjective/Objective:   [facility], presents with hypoxia, nonverbal, nonambulatory, has dementia, for repeat cxr for wheezing, on iv abx.                            Action/Plan: DC to ALF when medically ready.  Expected Discharge Date:  09/27/17               Expected Discharge Plan:  Assisted Living / Rest Home  In-House Referral:  Clinical Social Work  Discharge planning Services  CM Consult  Post Acute Care Choice:    Choice offered to:     DME Arranged:    DME Agency:     HH Arranged:    HH Agency:     Status of Service:  Completed, signed off  If discussed at [employer] Meetings, dates discussed:    Additional Comments:  Leone Havenaylor, Argusta Mcgann Clinton, RN 09/27/2017, 2:54 PM

## 2017-09-27 NOTE — Plan of Care (Signed)
Discussed with patients family member in room she maybe leaving

## 2017-09-27 NOTE — Progress Notes (Signed)
   Subjective: Hospital day 2  Overnight: Daughter reports that mom was expressing pain each time her leg was touched.  She received Tylenol 650 mg.  Today, [name] was evaluated at bedside.  I talked to her daughter, Pamela [facility] on the phone and explained current diagnosis and management with her.  She states that her mother was constipated but did not report any other complaints .  Objective:  Vital signs in last 24 hours: Vitals:   09/26/17 0811 09/26/17 1648 09/26/17 2349 09/27/17 0849  BP: 114/62 137/64 (!) 92/54 119/66  Pulse: 84 85 88 (!) 104  Resp: 19 20 18  (!) 22  Temp: 98.3 F (36.8 C) 99.1 F (37.3 C) 98.7 F (37.1 C) 99.6 F (37.6 C)  TempSrc: Oral Oral Oral Oral  SpO2: 95% 98% 97% 96%  Weight:      Height:        Physical exam Constitutional:  In no acute distress, sleeping, opening eyes to name Cardiovascular: Regular, rate, rhythm. Respiratory: Difficult to assess due to the patient's decreased respiratory effort Abdomen: Soft, nondistended, nontender palpation   Assessment/Plan:  Active Problems:   Hypoxia   Pneumonia  [name] is a 84 year old African-American femalewithpast medical history significant for advanced dementia-nonverbal and nonambulatory at baseline, hypertension, anemia and GERD who presented to the emergency department via EMS with altered mental status, intermittent hypoxia and hypotension.  Her symptoms have improved since admission.  She has remained afebrile and hemodynamically stable since admission.  Lab results have been unremarkable.   Sepsis 2/2 presumed community acquired pneumonia vs aspiration pneumonia Afebrile for the past 48 hours and hemodynamically stable.  Lactic acidosis has resolved and further lab work has not shown leukocytosis.  Blood culture revealed coagulase-negative staph with no imminent necessity for further antibiotic therapy.  She was started on Unasyn 3 g yesterday and will be transitioned to Augmentin  suspension 500 mg/6410mL. -Safe for discharge to assisted living facility -Continue  Augmentin suspension 500 mg/6710mL for 5 days.   AMS Advanced dementia No new changes since admission.  Continues to be nonverbal and nonambulatory at baseline.  Currently takes risperidone and valproic acid at her assisted living facility.  -Continue dysphagia 1 thin liquid diet  Hypertension Since admission blood pressure has been stable in the 100s/60s with no medical intervention.  Prior to admission she was taking amlodipine 5 mg at her nursing facility. Her daughter has been advised to discontinue amlodipine due to low baseline blood pressure.   FEN: Thin liquid,replace electrolytes as needed VTE prophylaxis: Lovenox 40 mg SQ CODE STATUS:DNR  Dispo: Anticipated discharge today.   [facility], Obed K, MD 09/27/2017, 1:02 PM Pager: [phone]

## 2017-09-27 NOTE — Progress Notes (Addendum)
[facility] called. [facility] aware [facility] Pt requires transportation with [facility] due to being bed bound. Medical Necessity form completed and tubed to pt's floor.   Montine CircleKelsy Marithza Malachi, Silverio LayLCSWA  Emergency Room  [phone]

## 2017-09-27 NOTE — Progress Notes (Signed)
Patient left via stretcher alert to self, clothes placed on and IV removed.  Patient recently had prn pain medication prior to leaving.  Family with patient in room.  [facility] transported

## 2017-09-27 NOTE — Progress Notes (Signed)
  Date: 09/27/2017  Patient name: Owens SharkBeatrice D Younis  Medical record number: [id number]  Date of birth: 11/06/1921   I have seen and evaluated this patient and I have discussed the plan of care with the house staff. Please see their note for complete details. I concur with their findings with the following additions/corrections: Owens SharkBeatrice D Ching was seen on AM rounds with team. Ms Malen GauzeFoster is stable - afebrile, nl BP - and may be transferred back to her memory unit.  Burns SpainButcher, Larwence Tu A, MD 09/27/2017, 3:33 PM

## 2017-09-27 NOTE — Progress Notes (Signed)
[place] [facility] [place] [facility].  It was explained that pt has returned to her baseline and per [facility], does not need a new FL2, and that her first dose of augmentin will not be due until tomorrow AM, because she received her last dose of IV Unasyn today at 2pm.

## 2017-09-28 LAB — CULTURE, BLOOD (ROUTINE X 2): SPECIAL REQUESTS: ADEQUATE

## 2017-09-30 LAB — CULTURE, BLOOD (ROUTINE X 2): CULTURE: NO GROWTH

## 2017-11-11 ENCOUNTER — Encounter (HOSPITAL_COMMUNITY): Payer: Self-pay | Admitting: *Deleted

## 2017-11-11 ENCOUNTER — Emergency Department (HOSPITAL_COMMUNITY): Payer: Medicare Other

## 2017-11-11 ENCOUNTER — Emergency Department (HOSPITAL_COMMUNITY)
Admission: EM | Admit: 2017-11-11 | Discharge: 2017-11-12 | Disposition: A | Discharge status: Home / Self Care | Payer: Medicare Other | Attending: Emergency Medicine | Admitting: Emergency Medicine

## 2017-11-11 DIAGNOSIS — E1122 Type 2 diabetes mellitus with diabetic chronic kidney disease: Secondary | ICD-10-CM | POA: Insufficient documentation

## 2017-11-11 DIAGNOSIS — F0390 Unspecified dementia without behavioral disturbance: Secondary | ICD-10-CM | POA: Diagnosis not present

## 2017-11-11 DIAGNOSIS — Z79899 Other long term (current) drug therapy: Secondary | ICD-10-CM | POA: Insufficient documentation

## 2017-11-11 DIAGNOSIS — R062 Wheezing: Secondary | ICD-10-CM | POA: Diagnosis not present

## 2017-11-11 DIAGNOSIS — N183 Chronic kidney disease, stage 3 (moderate): Secondary | ICD-10-CM | POA: Insufficient documentation

## 2017-11-11 DIAGNOSIS — R4182 Altered mental status, unspecified: Secondary | ICD-10-CM | POA: Diagnosis present

## 2017-11-11 DIAGNOSIS — Z86711 Personal history of pulmonary embolism: Secondary | ICD-10-CM | POA: Insufficient documentation

## 2017-11-11 DIAGNOSIS — Z7984 Long term (current) use of oral hypoglycemic drugs: Secondary | ICD-10-CM | POA: Insufficient documentation

## 2017-11-11 LAB — URINALYSIS, ROUTINE W REFLEX MICROSCOPIC
Bilirubin Urine: NEGATIVE
Glucose, UA: NEGATIVE mg/dL
HGB URINE DIPSTICK: NEGATIVE
Ketones, ur: NEGATIVE mg/dL
LEUKOCYTES UA: NEGATIVE
Nitrite: NEGATIVE
Protein, ur: NEGATIVE mg/dL
SPECIFIC GRAVITY, URINE: 1.017 (ref 1.005–1.030)
pH: 6 (ref 5.0–8.0)

## 2017-11-11 LAB — CBC WITH DIFFERENTIAL/PLATELET
BASOS ABS: 0 10*3/uL (ref 0.0–0.1)
BASOS PCT: 0 %
EOS ABS: 0.1 10*3/uL (ref 0.0–0.7)
EOS PCT: 3 %
HCT: 36.7 % (ref 36.0–46.0)
Hemoglobin: 12 g/dL (ref 12.0–15.0)
Lymphocytes Relative: 54 %
Lymphs Abs: 1.9 10*3/uL (ref 0.7–4.0)
MCH: 31.6 pg (ref 26.0–34.0)
MCHC: 32.7 g/dL (ref 30.0–36.0)
MCV: 96.6 fL (ref 78.0–100.0)
MONO ABS: 0.4 10*3/uL (ref 0.1–1.0)
Monocytes Relative: 12 %
NEUTROS ABS: 1.1 10*3/uL — AB (ref 1.7–7.7)
Neutrophils Relative %: 31 %
PLATELETS: 287 10*3/uL (ref 150–400)
RBC: 3.8 MIL/uL — ABNORMAL LOW (ref 3.87–5.11)
RDW: 14.3 % (ref 11.5–15.5)
WBC: 3.5 10*3/uL — ABNORMAL LOW (ref 4.0–10.5)

## 2017-11-11 LAB — BASIC METABOLIC PANEL
ANION GAP: 11 (ref 5–15)
BUN: 23 mg/dL (ref 8–23)
CHLORIDE: 109 mmol/L (ref 98–111)
CO2: 23 mmol/L (ref 22–32)
Calcium: 9.4 mg/dL (ref 8.9–10.3)
Creatinine, Ser: 0.83 mg/dL (ref 0.44–1.00)
GFR calc Af Amer: >60 mL/min (ref >60)
GFR, EST NON AFRICAN AMERICAN: 59 mL/min — AB (ref >60)
Glucose, Bld: 69 mg/dL — ABNORMAL LOW (ref 70–99)
POTASSIUM: 4.9 mmol/L (ref 3.5–5.1)
SODIUM: 143 mmol/L (ref 135–145)

## 2017-11-11 LAB — COMPREHENSIVE METABOLIC PANEL
ALBUMIN: 3.5 g/dL (ref 3.5–5.0)
ALT: 21 U/L (ref 0–44)
ANION GAP: 8 (ref 5–15)
AST: 25 U/L (ref 15–41)
Alkaline Phosphatase: 70 U/L (ref 38–126)
BUN: 23 mg/dL (ref 8–23)
CHLORIDE: 109 mmol/L (ref 98–111)
CO2: 27 mmol/L (ref 22–32)
Calcium: 9.7 mg/dL (ref 8.9–10.3)
Creatinine, Ser: 0.94 mg/dL (ref 0.44–1.00)
GFR calc Af Amer: 58 mL/min — ABNORMAL LOW (ref >60)
GFR calc non Af Amer: 50 mL/min — ABNORMAL LOW (ref >60)
GLUCOSE: 76 mg/dL (ref 70–99)
POTASSIUM: 5.7 mmol/L — AB (ref 3.5–5.1)
Sodium: 144 mmol/L (ref 135–145)
Total Bilirubin: 0.6 mg/dL (ref 0.3–1.2)
Total Protein: 6.7 g/dL (ref 6.5–8.1)

## 2017-11-11 LAB — I-STAT CG4 LACTIC ACID, ED: LACTIC ACID, VENOUS: 1.08 mmol/L (ref 0.5–1.9)

## 2017-11-11 MED ORDER — IOHEXOL 300 MG/ML  SOLN
75.0000 mL | Freq: Once | INTRAMUSCULAR | Status: AC | PRN
  Administered 2017-11-11: 75 mL via INTRAVENOUS

## 2017-11-11 NOTE — ED Notes (Signed)
Unable to obtain oral temp at this time. Patient will not cooperate. RN notified.

## 2017-11-11 NOTE — ED Triage Notes (Signed)
Staff at SNF called for "breathing problems" and stated that "during dinner pt had some wheezing and the last time that happened she had pneumonia". EMS found pt with no signs of respiratory distress and staff reported that she "had been more lethargic today but was normal for her". Pt at baseline is nonverbal per EMS.

## 2017-11-11 NOTE — ED Notes (Signed)
Daughter Dewayne Hatchnn was notified by SNF [facility]. PA has spoken with daughter at this time as well.

## 2017-11-11 NOTE — ED Provider Notes (Signed)
Medical screening examination/treatment/procedure(s) were conducted as a shared visit with non-physician practitioner(s) and myself.  I personally evaluated the patient during the encounter.  Questionable altered mental status.  Mia spoke to patients daughter who relayed that it seemed the patient was near baseline. Seems the patient is here more for wheezing and breathing difficulty than anything.  My evaluation the patient does not speak to me.  She is not hypoxic.  The rest of her vital signs are within normal limits.  She does have wheezing seems to be left greater than right.  Pending CT scan to evaluate but likely stable for discharge home.   EKG Interpretation  Date/Time:  Friday November 11 2017 22:19:56 EDT Ventricular Rate:  76 PR Interval:    QRS Duration: 132 QT Interval:  419 QTC Calculation: 472 R Axis:   -59 Text Interpretation:  Sinus rhythm RBBB and LAFB Probable left ventricular hypertrophy No significant change since last tracing Confirmed by Marily MemosMesner, Donaldo Teegarden [phone]([id number]) on 11/11/2017 10:37:54 PM      Clydean Posas, Barbara CowerJason, MD 11/12/17 [id number]

## 2017-11-11 NOTE — ED Notes (Signed)
Patient transported to CT 

## 2017-11-11 NOTE — ED Notes (Signed)
Bed: [id number] Expected date:  Expected time:  Means of arrival:  Comments: 84 yo AMS

## 2017-11-11 NOTE — ED Provider Notes (Signed)
[name] Provider Note   CSN: [id number] Arrival date & time: 11/11/17  1859     History   Chief Complaint Chief Complaint  Patient presents with  . Altered Mental Status    HPI Pamela Ramirez is a 84 y.o. female with a history of dementia, diabetes mellitus type 2, CKD stage III, GERD, and osteoporosis who presents to the emergency department by EMS from [facility] with a chief complaint of wheezing.  EMS reports the patient was noted to be wheezing after eating dinner.  EMS reports [facility] staff states that the last time the patient had similar symptoms that she had pneumonia.  EMS reports that staff stated that the patient was more altered today, but later stated that this was the patient's baseline.   Multiple attempts by multiple staff members were made to contact [facility], but the number appeared to not be connecting.  Spoke with and, the patient's daughter, who reports that she has visited her mother within the last week.  She states that at baseline she is nonverbal but alert.  She states that she is typically very fatigued at baseline and is not oriented to time or place since she is nonverbal.  The history is provided by the EMS personnel and the nursing home. No language interpreter was used.    Past Medical History:  Diagnosis Date  . Anxiety   . Chronic kidney disease   . Dementia   . Diabetes type 2, controlled (HCC)   . GERD (gastroesophageal reflux disease)   . Osteoporosis     Patient Active Problem List   Diagnosis Date Noted  . Pneumonia 09/26/2017  . Hypoxia 09/25/2017  . Acute encephalopathy 01/26/2015  . Dyslipidemia 01/26/2015  . CKD (chronic kidney disease) stage 3, GFR 30-59 ml/min (HCC) 01/26/2015  . Anemia of chronic kidney failure 01/26/2015  . Sepsis (HCC) 01/25/2015  . Pulmonary embolism (HCC) 05/12/2011  . Dementia 05/12/2011    History reviewed. No pertinent surgical  history.   OB History   None      Home Medications    Prior to Admission medications   Medication Sig Start Date End Date Taking? Authorizing Provider  acetaminophen (TYLENOL) 500 MG tablet Take 500 mg by mouth every 4 (four) hours as needed for mild pain, moderate pain, fever or headache.   Yes [provider]  acetaminophen (TYLENOL) 650 MG CR tablet Take 650 mg by mouth 3 (three) times daily.   Yes [provider]  alum & mag hydroxide-simeth (MAALOX/MYLANTA) 200-200-20 MG/5ML suspension Take 30 mLs by mouth every 6 (six) hours as needed for indigestion or heartburn.   Yes [provider]  divalproex (DEPAKOTE SPRINKLE) 125 MG capsule Take 250 mg by mouth 3 (three) times daily.    Yes [provider]  guaifenesin (ROBITUSSIN) 100 MG/5ML syrup Take 200 mg by mouth every 6 (six) hours as needed for cough.   Yes [provider]  loperamide (IMODIUM) 2 MG capsule Take 2 mg by mouth as needed for diarrhea or loose stools.    Yes [provider]  magnesium hydroxide (MILK OF MAGNESIA) 400 MG/5ML suspension Take 30 mLs by mouth at bedtime as needed for mild constipation or moderate constipation.   Yes [provider]  neomycin-bacitracin-polymyxin (NEOSPORIN) [id number] ointment Apply 1 application topically as needed (for minor skin tears/abrasions).    Yes [provider]  risperiDONE (RISPERDAL) 0.5 MG tablet Take 0.5 mg by mouth 3 (three)  times daily as needed (for agitation).   Yes [provider]  Skin Protectants, Misc. (MINERIN) CREA Apply 1 application topically 2 (two) times daily.    Yes [provider]    Family History History reviewed. No pertinent family history.  Social History Social History   Tobacco Use  . Smoking status: Never Smoker  . Smokeless tobacco: Never Used  Substance Use Topics  . Alcohol use: No  . Drug use: No     Allergies   Patient has no known  allergies.   Review of Systems Review of Systems  Unable to perform ROS: Dementia     Physical Exam Updated Vital Signs BP 119/78   Pulse 77   Temp (!) 95.3 F (35.2 C) (Rectal)   Resp 15   SpO2 98%   Physical Exam  Constitutional: No distress.  HENT:  Head: Normocephalic.  Eyes: Pupils are equal, round, and reactive to light. Conjunctivae and EOM are normal. No scleral icterus.  Neck: Neck supple.  Cardiovascular: Normal rate and regular rhythm. Exam reveals no gallop and no friction rub.  No murmur heard. Pulmonary/Chest: Effort normal. No stridor. No respiratory distress. She has wheezes. She has no rales. She exhibits no tenderness.  Bilateral expiratory wheezes, left greater than right.  Abdominal: Soft. She exhibits no distension and no mass. There is no tenderness. There is no rebound and no guarding. No hernia.  Neurological: She is alert. She displays no seizure activity. GCS eye subscore is 4. GCS verbal subscore is 5. GCS motor subscore is 6.  Moves all 4 extremities.  Skin: Skin is warm. No rash noted.  Psychiatric: Her behavior is normal. She is noncommunicative.  Nursing note and vitals reviewed.  ED Treatments / Results  Labs (all labs ordered are listed, but only abnormal results are displayed) Labs Reviewed  CBC WITH DIFFERENTIAL/PLATELET - Abnormal; Notable for the following components:      Result Value   WBC 3.5 (*)    RBC 3.80 (*)    Neutro Abs 1.1 (*)    All other components within normal limits  COMPREHENSIVE METABOLIC PANEL - Abnormal; Notable for the following components:   Potassium 5.7 (*)    GFR calc non Af Amer 50 (*)    GFR calc Af Amer 58 (*)    All other components within normal limits  BASIC METABOLIC PANEL - Abnormal; Notable for the following components:   Glucose, Bld 69 (*)    GFR calc non Af Amer 59 (*)    All other components within normal limits  CULTURE, BLOOD (ROUTINE X 2)  CULTURE, BLOOD (ROUTINE X 2)  URINALYSIS,  ROUTINE W REFLEX MICROSCOPIC  I-STAT CG4 LACTIC ACID, ED  CBG MONITORING, ED    EKG EKG Interpretation  Date/Time:  Friday November 11 2017 22:19:56 EDT Ventricular Rate:  76 PR Interval:    QRS Duration: 132 QT Interval:  419 QTC Calculation: 472 R Axis:   -59 Text Interpretation:  Sinus rhythm RBBB and LAFB Probable left ventricular hypertrophy No significant change since last tracing Confirmed by Marily MemosMesner, Jason [phone]([id number]) on 11/11/2017 10:37:54 PM   Radiology Dg Chest 2 View  Result Date: 11/11/2017 CLINICAL DATA:  Patient with wheezing. EXAM: CHEST - 2 VIEW COMPARISON:  Chest radiograph 09/26/2017. FINDINGS: Patient is mildly rotated to the right. Monitoring leads overlie the patient. Stable enlarged cardiac and mediastinal contours. Minimal heterogeneous opacities within the lower lungs bilaterally. No pleural effusion or pneumothorax. Unchanged smooth nodular density projecting over  the right lower hemithorax. Lateral view limited due to overlapping tissues. Thoracic spine degenerative changes. IMPRESSION: Bibasilar heterogeneous opacities favored to represent atelectasis. Infection not excluded. Smooth nodular density projecting over the right lower hemithorax is nonspecific and may represent loculated fluid. Recommend short-term follow-up chest radiograph to assess for stability or resolution. Electronically Signed   By: Annia Belt M.D.   On: 11/11/2017 20:20   Ct Chest W Contrast  Result Date: 11/11/2017 CLINICAL DATA:  Chronic dyspnea with episodic wheezing and more lethargy today. EXAM: CT CHEST WITH CONTRAST TECHNIQUE: Multidetector CT imaging of the chest was performed during intravenous contrast administration. CONTRAST:  75mL OMNIPAQUE IOHEXOL 300 MG/ML  SOLN COMPARISON:  CXR 11/11/2017 and chest CT 01/25/2015 FINDINGS: Cardiovascular: Mild atherosclerosis of the great vessels at the origins. Moderate calcific atherosclerosis of the thoracic aorta without aneurysm or dissection. No  large central pulmonary embolus identified. Heart size is top normal without pericardial effusion or thickening. Left main and three-vessel coronary arteriosclerosis is identified. Mediastinum/Nodes: No enlarged mediastinal, hilar, or axillary lymph nodes. Thyroid gland, trachea, and esophagus demonstrate no significant findings. Lungs/Pleura: Masslike opacities felt to represent fluid within the fissures are identified on the right, the largest measuring approximately 1.9 x 1.2 cm, series 5/73 and along the major fissure measuring 18 x 11 mm, series 5/58. Dependent atelectasis is otherwise noted of the lungs, more so in both lower lobes. No pneumothorax. Upper Abdomen: Stable water attenuating cysts of the liver and included kidneys, the largest in the upper pole of the left kidney measuring 4.3 x 3.4 x 4.1 cm. Musculoskeletal: No acute nor aggressive osseous lesions. IMPRESSION: 1. Loculated fluid collections within the right minor and major fissures consistent with pseudo-lesions of the lungs. Dependent atelectasis without acute pulmonary consolidations. 2. No active pulmonary disease. 3. Left main and three-vessel coronary arteriosclerosis. No large central pulmonary embolus. Aortic atherosclerosis without aneurysm or dissection. 4. Cysts of the liver and left kidney is above. Aortic Atherosclerosis (ICD10-I70.0). Electronically Signed   By: Tollie Eth M.D.   On: 11/11/2017 22:59    Procedures Procedures (including critical care time)  Medications Ordered in ED Medications  iohexol (OMNIPAQUE) 300 MG/ML solution 75 mL (75 mLs Intravenous Contrast Given 11/11/17 2228)     Initial Impression / Assessment and Plan / ED Course  I have reviewed the triage vital signs and the nursing notes.  Pertinent labs & imaging results that were available during my care of the patient were reviewed by me and considered in my medical decision making (see chart for details).     84 year old female with a history  of dementia, diabetes mellitus type 2, CKD stage III, GERD, and osteoporosis presenting by EMS from [facility] with wheezing and concern for altered mental status.  Spoke with the patient's daughter over the phone who reports that the mental status that I am describing sounds like the patient's baseline.  EMS also noted that [facility] at staff that the patient was more altered, but also stated she was at her baseline?  The patient's daughter reports that she has not noted any choking episodes when she has stopped by the SNF to share meals with her mother.  She was noted to be wheezing by SNF staff.   SaO2 98% with good waveform on the monitor.  She is normotensive without tachycardia.  No increased work of breathing, accessory muscle use, or nasal flaring.  On my exam, the patient is alert.  GCS 15.  She is nonverbal.  She  does have bilateral expiratory wheezes.  Chest x-ray is concerning for bilateral heterogeneous opacities favored to represent atelectasis infection not excluded with a small nodular density projecting over the right lower hemithorax that may represent loculated fluid.  Given concern for empyema, chest CT is ordered, which demonstrated loculated fluid collections in the right minor and major fissures consistent with pseudo-lesions of the lungs.  Discussed these findings with the patient's daughter and outpatient follow-up was recommended at that time.  EKG unchanged from previous. Initial CMP with potassium of 5.7?  The patient has a history of hyperkalemia and no risk factors at this time.  Repeated metabolic panel due to concern for hemolysis.  Repeat metabolic panel with normal potassium of 4.9, but patient's glucose was noted to be 69.  She was given juice and her CBG improved to 89.  UA is unremarkable.  Lactate is normal.  CBC is reassuring.  Blood cultures x2 are pending, but doubt infectious etiology at this time.  The patient's rectal temp was noted to be 95.3?  The patient was  evaluated by Dr. Clayborne Dana, attending physician, who feels safe with discharging the patient to [facility].  The patient's daughter was updated with all results.  She is hemodynamically stable and in no acute distress.  She is safe for return to [facility] at this time.  Final Clinical Impressions(s) / ED Diagnoses   Final diagnoses:  Wheezing    ED Discharge Orders    None       Barkley Boards, PA-C 11/12/17 0058    Mesner, Barbara Cower, MD 11/12/17 [id number]

## 2017-11-12 DIAGNOSIS — R062 Wheezing: Secondary | ICD-10-CM | POA: Diagnosis not present

## 2017-11-12 LAB — CBG MONITORING, ED: GLUCOSE-CAPILLARY: 89 mg/dL (ref 70–99)

## 2017-11-12 NOTE — ED Notes (Signed)
Patient has had some orange juice in attempt to raise CBG. Will continue to give patient sips and recheck CBG once patient has finished orange juice.

## 2017-11-12 NOTE — Discharge Instructions (Signed)
Thank you for allowing me to care for you today in the Emergency Department.   I discussed with your daughter, Dewayne Hatchnn, the results of your chest CT. It showed a pseudo-lesion of the lungs. You can discuss it further with your primary care provider, but no further work up was indicated in the Emergency Room at this time.  Your work up was other wise unremarkable.  Return if you develop new or worsening symptoms.

## 2017-11-17 LAB — CULTURE, BLOOD (ROUTINE X 2)
CULTURE: NO GROWTH
Culture: NO GROWTH
Special Requests: ADEQUATE

## 2017-12-01 ENCOUNTER — Encounter (HOSPITAL_COMMUNITY): Payer: Self-pay

## 2017-12-01 ENCOUNTER — Inpatient Hospital Stay (HOSPITAL_COMMUNITY)
Admission: EM | Admit: 2017-12-01 | Discharge: 2017-12-06 | DRG: 641 | Disposition: A | Discharge status: Hospice | Payer: Medicare Other | Source: Skilled Nursing Facility | Attending: Internal Medicine | Admitting: Internal Medicine

## 2017-12-01 ENCOUNTER — Other Ambulatory Visit: Payer: Self-pay

## 2017-12-01 ENCOUNTER — Emergency Department (HOSPITAL_COMMUNITY): Payer: Medicare Other

## 2017-12-01 DIAGNOSIS — F0280 Dementia in other diseases classified elsewhere without behavioral disturbance: Secondary | ICD-10-CM

## 2017-12-01 DIAGNOSIS — S9032XA Contusion of left foot, initial encounter: Secondary | ICD-10-CM | POA: Diagnosis present

## 2017-12-01 DIAGNOSIS — N183 Chronic kidney disease, stage 3 (moderate): Secondary | ICD-10-CM | POA: Diagnosis present

## 2017-12-01 DIAGNOSIS — R059 Cough, unspecified: Secondary | ICD-10-CM

## 2017-12-01 DIAGNOSIS — F419 Anxiety disorder, unspecified: Secondary | ICD-10-CM | POA: Diagnosis present

## 2017-12-01 DIAGNOSIS — K219 Gastro-esophageal reflux disease without esophagitis: Secondary | ICD-10-CM | POA: Diagnosis present

## 2017-12-01 DIAGNOSIS — R05 Cough: Secondary | ICD-10-CM

## 2017-12-01 DIAGNOSIS — E1122 Type 2 diabetes mellitus with diabetic chronic kidney disease: Secondary | ICD-10-CM | POA: Diagnosis present

## 2017-12-01 DIAGNOSIS — Z86711 Personal history of pulmonary embolism: Secondary | ICD-10-CM

## 2017-12-01 DIAGNOSIS — M810 Age-related osteoporosis without current pathological fracture: Secondary | ICD-10-CM | POA: Diagnosis present

## 2017-12-01 DIAGNOSIS — E870 Hyperosmolality and hypernatremia: Principal | ICD-10-CM | POA: Diagnosis present

## 2017-12-01 DIAGNOSIS — R1310 Dysphagia, unspecified: Secondary | ICD-10-CM | POA: Diagnosis present

## 2017-12-01 DIAGNOSIS — Z515 Encounter for palliative care: Secondary | ICD-10-CM | POA: Diagnosis not present

## 2017-12-01 DIAGNOSIS — F0150 Vascular dementia without behavioral disturbance: Secondary | ICD-10-CM

## 2017-12-01 DIAGNOSIS — I509 Heart failure, unspecified: Secondary | ICD-10-CM | POA: Diagnosis present

## 2017-12-01 DIAGNOSIS — X58XXXA Exposure to other specified factors, initial encounter: Secondary | ICD-10-CM | POA: Diagnosis present

## 2017-12-01 DIAGNOSIS — L8990 Pressure ulcer of unspecified site, unspecified stage: Secondary | ICD-10-CM

## 2017-12-01 DIAGNOSIS — G309 Alzheimer's disease, unspecified: Secondary | ICD-10-CM | POA: Diagnosis present

## 2017-12-01 DIAGNOSIS — Z66 Do not resuscitate: Secondary | ICD-10-CM | POA: Diagnosis present

## 2017-12-01 DIAGNOSIS — L89312 Pressure ulcer of right buttock, stage 2: Secondary | ICD-10-CM | POA: Diagnosis present

## 2017-12-01 DIAGNOSIS — D631 Anemia in chronic kidney disease: Secondary | ICD-10-CM | POA: Diagnosis present

## 2017-12-01 DIAGNOSIS — R0902 Hypoxemia: Secondary | ICD-10-CM | POA: Diagnosis present

## 2017-12-01 DIAGNOSIS — Z79899 Other long term (current) drug therapy: Secondary | ICD-10-CM

## 2017-12-01 DIAGNOSIS — J988 Other specified respiratory disorders: Secondary | ICD-10-CM | POA: Diagnosis present

## 2017-12-01 LAB — CBC WITH DIFFERENTIAL/PLATELET
BASOS PCT: 0 %
Basophils Absolute: 0 10*3/uL (ref 0.0–0.1)
Eosinophils Absolute: 0.1 10*3/uL (ref 0.0–0.7)
Eosinophils Relative: 1 %
HEMATOCRIT: 40.6 % (ref 36.0–46.0)
Hemoglobin: 13.1 g/dL (ref 12.0–15.0)
Lymphocytes Relative: 26 %
Lymphs Abs: 1.4 10*3/uL (ref 0.7–4.0)
MCH: 31.5 pg (ref 26.0–34.0)
MCHC: 32.3 g/dL (ref 30.0–36.0)
MCV: 97.6 fL (ref 78.0–100.0)
MONO ABS: 0.3 10*3/uL (ref 0.1–1.0)
MONOS PCT: 6 %
NEUTROS ABS: 3.6 10*3/uL (ref 1.7–7.7)
Neutrophils Relative %: 67 %
Platelets: 262 10*3/uL (ref 150–400)
RBC: 4.16 MIL/uL (ref 3.87–5.11)
RDW: 14.5 % (ref 11.5–15.5)
WBC: 5.3 10*3/uL (ref 4.0–10.5)

## 2017-12-01 LAB — COMPREHENSIVE METABOLIC PANEL
ALBUMIN: 3.9 g/dL (ref 3.5–5.0)
ALT: 24 U/L (ref 0–44)
ANION GAP: 7 (ref 5–15)
AST: 20 U/L (ref 15–41)
Alkaline Phosphatase: 77 U/L (ref 38–126)
BILIRUBIN TOTAL: 0.6 mg/dL (ref 0.3–1.2)
BUN: 19 mg/dL (ref 8–23)
CO2: 33 mmol/L — ABNORMAL HIGH (ref 22–32)
Calcium: 10.4 mg/dL — ABNORMAL HIGH (ref 8.9–10.3)
Chloride: 111 mmol/L (ref 98–111)
Creatinine, Ser: 0.78 mg/dL (ref 0.44–1.00)
GFR calc non Af Amer: >60 mL/min (ref >60)
GLUCOSE: 128 mg/dL — AB (ref 70–99)
POTASSIUM: 5.1 mmol/L (ref 3.5–5.1)
Sodium: 151 mmol/L — ABNORMAL HIGH (ref 135–145)
TOTAL PROTEIN: 7.6 g/dL (ref 6.5–8.1)

## 2017-12-01 LAB — I-STAT CHEM 8, ED
BUN: 19 mg/dL (ref 8–23)
CHLORIDE: 108 mmol/L (ref 98–111)
Calcium, Ion: 1.33 mmol/L (ref 1.15–1.40)
Creatinine, Ser: 0.8 mg/dL (ref 0.44–1.00)
GLUCOSE: 124 mg/dL — AB (ref 70–99)
HEMATOCRIT: 41 % (ref 36.0–46.0)
Hemoglobin: 13.9 g/dL (ref 12.0–15.0)
POTASSIUM: 4.8 mmol/L (ref 3.5–5.1)
SODIUM: 147 mmol/L — AB (ref 135–145)
TCO2: 29 mmol/L (ref 22–32)

## 2017-12-01 LAB — I-STAT TROPONIN, ED: TROPONIN I, POC: 0.01 ng/mL (ref 0.00–0.08)

## 2017-12-01 LAB — BRAIN NATRIURETIC PEPTIDE: B NATRIURETIC PEPTIDE 5: 153.3 pg/mL — AB (ref 0.0–100.0)

## 2017-12-01 MED ORDER — GLUCAGON HCL RDNA (DIAGNOSTIC) 1 MG IJ SOLR
1.0000 mg | Freq: Once | INTRAMUSCULAR | Status: AC
  Administered 2017-12-01: 1 mg via INTRAMUSCULAR
  Filled 2017-12-01: qty 1

## 2017-12-01 NOTE — ED Notes (Addendum)
Attempted to contact facility regarding pt information. No answer

## 2017-12-01 NOTE — H&P (Addendum)
[facility]                                                                                    Patient Demographics  Pamela Ramirez, is a 84 y.o. female  CSN: [id number]  MRN: [id number]  DOB - [birth year]  Admit Date - 12/01/2017  Outpatient Primary MD for the patient is Ron Parker, MD   With History of -  Past Medical History:  Diagnosis Date  . Anxiety   . Chronic kidney disease   . Dementia   . Diabetes type 2, controlled (HCC)   . GERD (gastroesophageal reflux disease)   . Osteoporosis       History reviewed. No pertinent surgical history.  in for   Chief Complaint  Patient presents with  . Cough     HPI  Pamela Ramirez  is a 84 y.o. female, with past medical history significant for diabetes mellitus, dementia and chronic kidney disease who was sent from the nursing home for evaluation of cough and excessive mucus production.  The patient has severe dementia and has a base history of dysphasia.  In the emergency room the patient was found to be hypernatremic and CT of the neck was done due to concern of foreign object stuck and it was negative.  Discussed with family at bedside.  Patient seems to be having increased dysphagia.  Discussed goals of care and treatment options.  Her oxygen requirements were noted to be increased in the emergency room and her chest x-ray showed mild congestive heart failure.  Patient will be admitted for further management, goals of care and treatment of hyponatremia.  She will be started on D5 water and consider Lasix in a.m.    Review of Systems    Unable to obtain due to patient's condition Social History Social History   Tobacco Use  . Smoking status: Never Smoker  . Smokeless tobacco: Never Used  Substance Use Topics  . Alcohol use: No     Family History History reviewed. No pertinent family history.   Prior to Admission medications   Medication Sig Start Date End Date Taking? Authorizing Provider   acetaminophen (TYLENOL) 500 MG tablet Take 500 mg by mouth every 4 (four) hours as needed for mild pain, moderate pain, fever or headache.   Yes [provider]  acetaminophen (TYLENOL) 650 MG CR tablet Take 650 mg by mouth 3 (three) times daily.   Yes [provider]  alum & mag hydroxide-simeth (MAALOX/MYLANTA) 200-200-20 MG/5ML suspension Take 30 mLs by mouth every 6 (six) hours as needed for indigestion or heartburn.   Yes [provider]  amLODipine (NORVASC) 5 MG tablet Take 5 mg by mouth daily.   Yes [provider]  divalproex (DEPAKOTE SPRINKLE) 125 MG capsule Take 250 mg by mouth 3 (three) times daily.    Yes [provider]  guaifenesin (ROBITUSSIN) 100 MG/5ML syrup Take 200 mg by mouth every 6 (six) hours as needed for cough.   Yes [provider]  loperamide (IMODIUM) 2 MG capsule Take 2 mg by mouth as needed for diarrhea or loose stools.    Yes [provider]  magnesium hydroxide (MILK OF MAGNESIA) 400 MG/5ML suspension Take 30 mLs by mouth at bedtime as needed for mild constipation or moderate constipation.   Yes [provider]  neomycin-bacitracin-polymyxin (NEOSPORIN) [phone] ointment Apply 1 application topically as needed (for minor skin tears/abrasions).    Yes [provider]  risperiDONE (RISPERDAL) 0.5 MG tablet Take 0.5 mg by mouth 3 (three) times daily as needed (for agitation).   Yes [provider]  Skin Protectants, Misc. (DIMETHICONE-ZINC OXIDE) cream Apply topically 2 (two) times daily as needed for dry skin.   Yes [provider]  Skin Protectants, Misc. (MINERIN) CREA Apply 1 application topically 2 (two) times daily.    Yes [provider]    No Known Allergies  Physical Exam  Vitals  Blood pressure 94/74, pulse 87, resp. rate (!) 29, height 5\' 4"  (1.626 m), weight 72.6 kg, SpO2 96 %.   1. General chronically ill female, encephalopathic  2.  Flat  affect and insight,  3.  Unable to obtain neurological examination due to encephalopathy.  4. Ears and Eyes appear Normal, Conjunctivae clear, mild drooling.  5. Supple Neck, mild JVD, .  6. Symmetrical Chest wall movement, decreased breath sounds bilaterally.  7. RRR, No Gallops, Rubs or Murmurs, No Parasternal Heave.  8. Positive Bowel Sounds, Abdomen Soft, Non tender, No organomegaly appriciated,No rebound -guarding or rigidity.  9.  No Cyanosis, decreased skin Turgor, No Skin Rash or Bruise.  10. Good muscle tone,  joints appear normal , no effusions, Normal ROM.    Data Review  CBC Recent Labs  Lab 12/01/17 2107 12/01/17 2113  WBC 5.3  --   HGB 13.1 13.9  HCT 40.6 41.0  PLT 262  --   MCV 97.6  --   MCH 31.5  --   MCHC 32.3  --   RDW 14.5  --   LYMPHSABS 1.4  --   MONOABS 0.3  --   EOSABS 0.1  --   BASOSABS 0.0  --    ------------------------------------------------------------------------------------------------------------------  Chemistries  Recent Labs  Lab 12/01/17 2107 12/01/17 2113  NA 151* 147*  K 5.1 4.8  CL 111 108  CO2 33*  --   GLUCOSE 128* 124*  BUN 19 19  CREATININE 0.78 0.80  CALCIUM 10.4*  --   AST 20  --   ALT 24  --   ALKPHOS 77  --   BILITOT 0.6  --    ------------------------------------------------------------------------------------------------------------------ estimated creatinine clearance is 42 mL/min (by C-G formula based on SCr of 0.8 mg/dL). ------------------------------------------------------------------------------------------------------------------ No results for input(s): TSH, T4TOTAL, T3FREE, THYROIDAB in the last 72 hours.  Invalid input(s): FREET3   Coagulation profile No results for input(s): INR, PROTIME in the last 168 hours. ------------------------------------------------------------------------------------------------------------------- No results for input(s): DDIMER in the last 72  hours. -------------------------------------------------------------------------------------------------------------------  Cardiac Enzymes No results for input(s): CKMB, TROPONINI, MYOGLOBIN in the last 168 hours.  Invalid input(s): CK ------------------------------------------------------------------------------------------------------------------ Invalid input(s): POCBNP   ---------------------------------------------------------------------------------------------------------------  Urinalysis    Component Value Date/Time   COLORURINE YELLOW 11/11/2017 2046   APPEARANCEUR CLEAR 11/11/2017 2046   LABSPEC 1.017 11/11/2017 2046   PHURINE 6.0 11/11/2017 2046   GLUCOSEU NEGATIVE 11/11/2017 2046   HGBUR NEGATIVE 11/11/2017 2046   BILIRUBINUR NEGATIVE 11/11/2017 2046   KETONESUR NEGATIVE 11/11/2017 2046   PROTEINUR NEGATIVE 11/11/2017 2046   UROBILINOGEN 0.2 01/25/2015 1550   NITRITE NEGATIVE 11/11/2017 2046   LEUKOCYTESUR NEGATIVE 11/11/2017 2046    ----------------------------------------------------------------------------------------------------------------   Imaging results:   Dg  Chest 2 View  Result Date: 12/01/2017 CLINICAL DATA:  Dementia.  Cough. EXAM: CHEST - 2 VIEW COMPARISON:  11/11/2017 FINDINGS: The lungs are clear without focal pneumonia, edema, pneumothorax or pleural effusion. The cardiopericardial silhouette is within normal limits for size. There is pulmonary vascular congestion without overt pulmonary edema. The visualized bony structures of the thorax are intact. Telemetry leads overlie the chest. IMPRESSION: Vascular congestion without acute cardiopulmonary findings. Electronically Signed   By: Kennith Center M.D.   On: 12/01/2017 18:00   Dg Chest 2 View  Result Date: 11/11/2017 CLINICAL DATA:  Patient with wheezing. EXAM: CHEST - 2 VIEW COMPARISON:  Chest radiograph 09/26/2017. FINDINGS: Patient is mildly rotated to the right. Monitoring leads overlie the  patient. Stable enlarged cardiac and mediastinal contours. Minimal heterogeneous opacities within the lower lungs bilaterally. No pleural effusion or pneumothorax. Unchanged smooth nodular density projecting over the right lower hemithorax. Lateral view limited due to overlapping tissues. Thoracic spine degenerative changes. IMPRESSION: Bibasilar heterogeneous opacities favored to represent atelectasis. Infection not excluded. Smooth nodular density projecting over the right lower hemithorax is nonspecific and may represent loculated fluid. Recommend short-term follow-up chest radiograph to assess for stability or resolution. Electronically Signed   By: Annia Belt M.D.   On: 11/11/2017 20:20   Ct Soft Tissue Neck Wo Contrast  Result Date: 12/01/2017 CLINICAL DATA:  Spitting up mucus, assess for foreign body. History of dementia. EXAM: CT NECK WITHOUT CONTRAST TECHNIQUE: Multidetector CT imaging of the neck was performed following the standard protocol without intravenous contrast. COMPARISON:  CT HEAD September 25, 2017 and CT cervical spine January 24, 2016 FINDINGS: Moderately motion degraded examination. PHARYNX AND LARYNX: Normal.  Widely patent airway. SALIVARY GLANDS: Normal. THYROID: Normal. LYMPH NODES: No lymphadenopathy by CT size criteria, sensitivity decreased without intravenous contrast. VASCULAR: Moderate calcific atherosclerosis carotid siphon, carotid bifurcations and aortic arch. LIMITED INTRACRANIAL: Severe mesial temporal lobe atrophy compatible with neuro degenerative syndromes. VISUALIZED ORBITS: Status post bilateral ocular lens implants. MASTOIDS AND VISUALIZED PARANASAL SINUSES: Stable small RIGHT frontal sinus soft tissue mass with cortical dehiscence most compatible with mucocele. SKELETON: Nonacute. Patient is edentulous. Severe upper cervical spondylosis. Mild chronic T3 compression fracture. UPPER CHEST: Lung apices are clear. Mild fluid distended esophagus with air-fluid level  proximally. OTHER: None. IMPRESSION: 1. Motion degraded examination. No radiopaque foreign bodies. Widely patent airway. 2. Mild fluid distended esophagus. Electronically Signed   By: Awilda Metro M.D.   On: 12/01/2017 19:38   Ct Chest W Contrast  Result Date: 11/11/2017 CLINICAL DATA:  Chronic dyspnea with episodic wheezing and more lethargy today. EXAM: CT CHEST WITH CONTRAST TECHNIQUE: Multidetector CT imaging of the chest was performed during intravenous contrast administration. CONTRAST:  75mL OMNIPAQUE IOHEXOL 300 MG/ML  SOLN COMPARISON:  CXR 11/11/2017 and chest CT 01/25/2015 FINDINGS: Cardiovascular: Mild atherosclerosis of the great vessels at the origins. Moderate calcific atherosclerosis of the thoracic aorta without aneurysm or dissection. No large central pulmonary embolus identified. Heart size is top normal without pericardial effusion or thickening. Left main and three-vessel coronary arteriosclerosis is identified. Mediastinum/Nodes: No enlarged mediastinal, hilar, or axillary lymph nodes. Thyroid gland, trachea, and esophagus demonstrate no significant findings. Lungs/Pleura: Masslike opacities felt to represent fluid within the fissures are identified on the right, the largest measuring approximately 1.9 x 1.2 cm, series 5/73 and along the major fissure measuring 18 x 11 mm, series 5/58. Dependent atelectasis is otherwise noted of the lungs, more so in both lower lobes. No pneumothorax. Upper Abdomen: Stable  water attenuating cysts of the liver and included kidneys, the largest in the upper pole of the left kidney measuring 4.3 x 3.4 x 4.1 cm. Musculoskeletal: No acute nor aggressive osseous lesions. IMPRESSION: 1. Loculated fluid collections within the right minor and major fissures consistent with pseudo-lesions of the lungs. Dependent atelectasis without acute pulmonary consolidations. 2. No active pulmonary disease. 3. Left main and three-vessel coronary arteriosclerosis. No large  central pulmonary embolus. Aortic atherosclerosis without aneurysm or dissection. 4. Cysts of the liver and left kidney is above. Aortic Atherosclerosis (ICD10-I70.0). Electronically Signed   By: Tollie Ethavid  Kwon M.D.   On: 11/11/2017 22:59      Assessment & Plan  1.  Hypernatremia      IV fluids, D5 water  2.  Dysphagia      Worsening      Will need speech therapy evaluation in AM  3.  Diabetes mellitus type 2     Insulin sliding scale   4.  Advanced dementia , worsening mental status     Check TSH and urinalysis     Haldol as needed for behavioral disturbances  5.  History of osteoporosis  6.  Hypoxemia      Probable aspiration    DVT Prophylaxis Heparin  AM Labs Ordered, also please review Full Orders  Discussed with family at bedside .  Advised goals of care meeting , and placing the patient on comfort care.  Patient seems to have grave prognosis .   Code Status DNR  Disposition Plan: Back to nursing home possibly with hospice  Time spent in minutes : 38 minutes  Condition GUARDED   @SIGNATURE @

## 2017-12-01 NOTE — ED Notes (Signed)
Pt placed on 4L of O2 due to SpO2 dropping to 85%. Pt has sputum in mouth and is unable to spit. Staff attempted to suction mouth but pt wont let us.

## 2017-12-01 NOTE — ED Notes (Signed)
ED TO INPATIENT HANDOFF REPORT  Name/Age/Gender Pamela Ramirez 84 y.o. female  Code Status Code Status History    Date Active Date Inactive Code Status Order ID Comments User Context   09/25/2017 1112 09/28/2017 0121 DNR [id number]  Alphonzo Grieve, MD ED   01/25/2015 1921 01/27/2015 1904 DNR [id number]  Gennaro Africa, MD ED   05/12/2011 0441 05/14/2011 1743 Full Code [id number]  Tildon Husky, RN Inpatient    Questions for Most Recent Historical Code Status (Order [id number])    Question Answer Comment   In the event of cardiac or respiratory ARREST Do not call a "code blue"    In the event of cardiac or respiratory ARREST Do not perform Intubation, CPR, defibrillation or ACLS    In the event of cardiac or respiratory ARREST Use medication by any route, position, wound care, and other measures to relive pain and suffering. May use oxygen, suction and manual treatment of airway obstruction as needed for comfort.    Comments DNR form with patient, ED provider discussed with family       Home/SNF/Other Nursing Home [facility])  Chief Complaint conjested cough  Level of Care/Admitting Diagnosis ED Disposition    ED Disposition Condition [place]: [facility] [902111]  Level of Care: Med-Surg [16]  Diagnosis: Hypernatremia [552080]  Admitting Physician: Merton Border [[id number]]  Attending Physician: Laren Everts, ALI Marshal.Browner  PT Class (Do Not Modify): Observation [104]  PT Acc Code (Do Not Modify): Observation [10022]       Medical History Past Medical History:  Diagnosis Date  . Anxiety   . Chronic kidney disease   . Dementia   . Diabetes type 2, controlled ([place])   . GERD (gastroesophageal reflux disease)   . Osteoporosis     Allergies No Known Allergies  IV Location/Drains/Wounds Patient Lines/Drains/Airways Status   Active Line/Drains/Airways    None          Labs/Imaging Results for orders placed or performed  during the hospital encounter of 12/01/17 (from the past 48 hour(s))  CBC with Differential     Status: None   Collection Time: 12/01/17  9:07 PM  Result Value Ref Range   WBC 5.3 4.0 - 10.5 K/uL   RBC 4.16 3.87 - 5.11 MIL/uL   Hemoglobin 13.1 12.0 - 15.0 g/dL   HCT 40.6 36.0 - 46.0 %   MCV 97.6 78.0 - 100.0 fL   MCH 31.5 26.0 - 34.0 pg   MCHC 32.3 30.0 - 36.0 g/dL   RDW 14.5 11.5 - 15.5 %   Platelets 262 150 - 400 K/uL   Neutrophils Relative % 67 %   Neutro Abs 3.6 1.7 - 7.7 K/uL   Lymphocytes Relative 26 %   Lymphs Abs 1.4 0.7 - 4.0 K/uL   Monocytes Relative 6 %   Monocytes Absolute 0.3 0.1 - 1.0 K/uL   Eosinophils Relative 1 %   Eosinophils Absolute 0.1 0.0 - 0.7 K/uL   Basophils Relative 0 %   Basophils Absolute 0.0 0.0 - 0.1 K/uL    Comment: Performed at [facility], [place] [address] [place], [place] [address]  Comprehensive metabolic panel     Status: Abnormal   Collection Time: 12/01/17  9:07 PM  Result Value Ref Range   Sodium 151 (H) 135 - 145 mmol/L   Potassium 5.1 3.5 - 5.1 mmol/L   Chloride 111 98 - 111 mmol/L   CO2 33 (H) 22 -  32 mmol/L   Glucose, Bld 128 (H) 70 - 99 mg/dL   BUN 19 8 - 23 mg/dL   Creatinine, Ser 0.78 0.44 - 1.00 mg/dL   Calcium 10.4 (H) 8.9 - 10.3 mg/dL   Total Protein 7.6 6.5 - 8.1 g/dL   Albumin 3.9 3.5 - 5.0 g/dL   AST 20 15 - 41 U/L   ALT 24 0 - 44 U/L   Alkaline Phosphatase 77 38 - 126 U/L   Total Bilirubin 0.6 0.3 - 1.2 mg/dL   GFR calc non Af Amer >60 >60 mL/min   GFR calc Af Amer >60 >60 mL/min    Comment: (NOTE) The eGFR has been calculated using the CKD EPI equation. This calculation has not been validated in all clinical situations. eGFR's persistently <60 mL/min signify possible Chronic Kidney Disease.    Anion gap 7 5 - 15    Comment: Performed at [facility], [place] [address], [place], [place] [address]  Brain natriuretic peptide     Status: Abnormal   Collection Time: 12/01/17  9:07  PM  Result Value Ref Range   B Natriuretic Peptide 153.3 (H) 0.0 - 100.0 pg/mL    Comment: Performed at [facility], [place] [address] [place],  [address]  I-stat troponin, ED     Status: None   Collection Time: 12/01/17  9:11 PM  Result Value Ref Range   Troponin i, poc 0.01 0.00 - 0.08 ng/mL   Comment 3            Comment: Due to the release kinetics of cTnI, a negative result within the first hours of the onset of symptoms does not rule out myocardial infarction with certainty. If myocardial infarction is still suspected, repeat the test at appropriate intervals.   I-stat chem 8, ed     Status: Abnormal   Collection Time: 12/01/17  9:13 PM  Result Value Ref Range   Sodium 147 (H) 135 - 145 mmol/L   Potassium 4.8 3.5 - 5.1 mmol/L   Chloride 108 98 - 111 mmol/L   BUN 19 8 - 23 mg/dL   Creatinine, Ser 0.80 0.44 - 1.00 mg/dL   Glucose, Bld 124 (H) 70 - 99 mg/dL   Calcium, Ion 1.33 1.15 - 1.40 mmol/L   TCO2 29 22 - 32 mmol/L   Hemoglobin 13.9 12.0 - 15.0 g/dL   HCT 41.0 36.0 - 46.0 %   Dg Chest 2 View  Result Date: 12/01/2017 CLINICAL DATA:  Dementia.  Cough. EXAM: CHEST - 2 VIEW COMPARISON:  11/11/2017 FINDINGS: The lungs are clear without focal pneumonia, edema, pneumothorax or pleural effusion. The cardiopericardial silhouette is within normal limits for size. There is pulmonary vascular congestion without overt pulmonary edema. The visualized bony structures of the thorax are intact. Telemetry leads overlie the chest. IMPRESSION: Vascular congestion without acute cardiopulmonary findings. Electronically Signed   By: Misty Stanley M.D.   On: 12/01/2017 18:00   Ct Soft Tissue Neck Wo Contrast  Result Date: 12/01/2017 CLINICAL DATA:  Spitting up mucus, assess for foreign body. History of dementia. EXAM: CT NECK WITHOUT CONTRAST TECHNIQUE: Multidetector CT imaging of the neck was performed following the standard protocol without intravenous contrast.  COMPARISON:  CT HEAD September 25, 2017 and CT cervical spine January 24, 2016 FINDINGS: Moderately motion degraded examination. PHARYNX AND LARYNX: Normal.  Widely patent airway. SALIVARY GLANDS: Normal. THYROID: Normal. LYMPH NODES: No lymphadenopathy by CT size criteria, sensitivity decreased without intravenous contrast.  VASCULAR: Moderate calcific atherosclerosis carotid siphon, carotid bifurcations and aortic arch. LIMITED INTRACRANIAL: Severe mesial temporal lobe atrophy compatible with neuro degenerative syndromes. VISUALIZED ORBITS: Status post bilateral ocular lens implants. MASTOIDS AND VISUALIZED PARANASAL SINUSES: Stable small RIGHT frontal sinus soft tissue mass with cortical dehiscence most compatible with mucocele. SKELETON: Nonacute. Patient is edentulous. Severe upper cervical spondylosis. Mild chronic T3 compression fracture. UPPER CHEST: Lung apices are clear. Mild fluid distended esophagus with air-fluid level proximally. OTHER: None. IMPRESSION: 1. Motion degraded examination. No radiopaque foreign bodies. Widely patent airway. 2. Mild fluid distended esophagus. Electronically Signed   By: Elon Alas M.D.   On: 12/01/2017 19:38    Pending Labs [employer] (From admission, onward)    Start     Ordered   Signed and Held  TSH  Once,   R     Signed and Held   Signed and Held  Urinalysis, Routine w reflex microscopic  Once,   R     Signed and Held   Signed and Held  Basic metabolic panel  Tomorrow morning,   R     Signed and Held          Vitals/Pain Today's Vitals   12/01/17 1649 12/01/17 1910 12/01/17 2000 12/01/17 2200  BP:  (!) 123/92 (!) 110/54 94/74  Pulse:  92 87 87  Resp:  18 (!) 23 (!) 29  SpO2:  97% (!) 87% 96%  Weight: 72.6 kg     Height: 5' 4"  (1.626 m)       Isolation Precautions No active isolations  Medications Medications  glucagon (human recombinant) (GLUCAGEN) injection 1 mg (1 mg Intramuscular Given 12/01/17 1833)     Mobility non-ambulatory

## 2017-12-01 NOTE — ED Notes (Signed)
Patient transported to CT 

## 2017-12-01 NOTE — ED Notes (Signed)
Bed: WA20 Expected date:  Expected time:  Means of arrival:  Comments: EMS-84 y/o productive cough

## 2017-12-01 NOTE — ED Notes (Signed)
This RN tried to obtain an IV and labs, but pt fought back too much to be able to do so.  MD made aware.  Orders changed to comply.

## 2017-12-01 NOTE — ED Provider Notes (Signed)
[facility]-EMERGENCY DEPT Provider Note   CSN: [id number] Arrival date & time: 12/01/17  1629  History   Chief Complaint Chief Complaint  Patient presents with  . Cough   HPI Pamela Ramirez is a 84 y.o. female with a past medical history significant for DM, Dementia, CKD who presents for evaluation of cough and excess mucous production. Per facility patient is non-verbal at baseline and has had a cough x 1 day with excess white frothy mucous production. Facility states she had been clearing her throat excessively.   HPI limited due to dementia. HPI was obtained from EMS and nursing facility.  HPI  Past Medical History:  Diagnosis Date  . Anxiety   . Chronic kidney disease   . Dementia   . Diabetes type 2, controlled (HCC)   . GERD (gastroesophageal reflux disease)   . Osteoporosis     Patient Active Problem List   Diagnosis Date Noted  . Pneumonia 09/26/2017  . Hypoxia 09/25/2017  . Acute encephalopathy 01/26/2015  . Dyslipidemia 01/26/2015  . CKD (chronic kidney disease) stage 3, GFR 30-59 ml/min (HCC) 01/26/2015  . Anemia of chronic kidney failure 01/26/2015  . Sepsis (HCC) 01/25/2015  . Pulmonary embolism (HCC) 05/12/2011  . Dementia 05/12/2011    History reviewed. No pertinent surgical history.   OB History   None      Home Medications    Prior to Admission medications   Medication Sig Start Date End Date Taking? Authorizing Provider  acetaminophen (TYLENOL) 500 MG tablet Take 500 mg by mouth every 4 (four) hours as needed for mild pain, moderate pain, fever or headache.   Yes [provider]  acetaminophen (TYLENOL) 650 MG CR tablet Take 650 mg by mouth 3 (three) times daily.   Yes [provider]  alum & mag hydroxide-simeth (MAALOX/MYLANTA) 200-200-20 MG/5ML suspension Take 30 mLs by mouth every 6 (six) hours as needed for indigestion or heartburn.   Yes [provider]  amLODipine (NORVASC) 5 MG tablet  Take 5 mg by mouth daily.   Yes [provider]  divalproex (DEPAKOTE SPRINKLE) 125 MG capsule Take 250 mg by mouth 3 (three) times daily.    Yes [provider]  guaifenesin (ROBITUSSIN) 100 MG/5ML syrup Take 200 mg by mouth every 6 (six) hours as needed for cough.   Yes [provider]  loperamide (IMODIUM) 2 MG capsule Take 2 mg by mouth as needed for diarrhea or loose stools.    Yes [provider]  magnesium hydroxide (MILK OF MAGNESIA) 400 MG/5ML suspension Take 30 mLs by mouth at bedtime as needed for mild constipation or moderate constipation.   Yes [provider]  neomycin-bacitracin-polymyxin (NEOSPORIN) [phone] ointment Apply 1 application topically as needed (for minor skin tears/abrasions).    Yes [provider]  risperiDONE (RISPERDAL) 0.5 MG tablet Take 0.5 mg by mouth 3 (three) times daily as needed (for agitation).   Yes [provider]  Skin Protectants, Misc. (DIMETHICONE-ZINC OXIDE) cream Apply topically 2 (two) times daily as needed for dry skin.   Yes [provider]  Skin Protectants, Misc. (MINERIN) CREA Apply 1 application topically 2 (two) times daily.    Yes [provider]    Family History History reviewed. No pertinent family history.  Social History Social History   Tobacco Use  . Smoking status: Never Smoker  . Smokeless tobacco: Never Used  Substance Use Topics  . Alcohol use: No  . Drug use: No  Allergies   Patient has no known allergies.   Review of Systems Review of Systems  Unable to perform ROS: Dementia     Physical Exam Updated Vital Signs BP 94/74   Pulse 87   Resp (!) 29   Ht 5\' 4"  (1.626 m)   Wt 72.6 kg   SpO2 96%   BMI 27.46 kg/m   Physical Exam  Constitutional: She appears well-developed and well-nourished. No distress.  HENT:  Head: Atraumatic.  Copious amount of clear, white frothy fluid draining from patients mouth and onto her  chest. Unable to swallow secretions. Patient is actively clearing throat repeatedly.  Eyes: Pupils are equal, round, and reactive to light.  Neck: Normal range of motion. Neck supple.  Cardiovascular: Normal rate, regular rhythm and normal heart sounds.  Pulmonary/Chest: Effort normal. No respiratory distress. She has rales.  Abdominal: Soft. Bowel sounds are normal. She exhibits no distension.  Musculoskeletal: Normal range of motion.  Neurological: She is alert.  Skin: Skin is warm and dry. She is not diaphoretic.  Psychiatric:  Demented at baseline. Does not follow commands. Appears agitated on exam.  Nursing note and vitals reviewed.    ED Treatments / Results  Labs (all labs ordered are listed, but only abnormal results are displayed) Labs Reviewed  COMPREHENSIVE METABOLIC PANEL - Abnormal; Notable for the following components:      Result Value   Sodium 151 (*)    CO2 33 (*)    Glucose, Bld 128 (*)    Calcium 10.4 (*)    All other components within normal limits  BRAIN NATRIURETIC PEPTIDE - Abnormal; Notable for the following components:   B Natriuretic Peptide 153.3 (*)    All other components within normal limits  I-STAT CHEM 8, ED - Abnormal; Notable for the following components:   Sodium 147 (*)    Glucose, Bld 124 (*)    All other components within normal limits  CBC WITH DIFFERENTIAL/PLATELET  I-STAT TROPONIN, ED    EKG None  Radiology Dg Chest 2 View  Result Date: 12/01/2017 CLINICAL DATA:  Dementia.  Cough. EXAM: CHEST - 2 VIEW COMPARISON:  11/11/2017 FINDINGS: The lungs are clear without focal pneumonia, edema, pneumothorax or pleural effusion. The cardiopericardial silhouette is within normal limits for size. There is pulmonary vascular congestion without overt pulmonary edema. The visualized bony structures of the thorax are intact. Telemetry leads overlie the chest. IMPRESSION: Vascular congestion without acute cardiopulmonary findings. Electronically  Signed   By: Kennith Center M.D.   On: 12/01/2017 18:00   Ct Soft Tissue Neck Wo Contrast  Result Date: 12/01/2017 CLINICAL DATA:  Spitting up mucus, assess for foreign body. History of dementia. EXAM: CT NECK WITHOUT CONTRAST TECHNIQUE: Multidetector CT imaging of the neck was performed following the standard protocol without intravenous contrast. COMPARISON:  CT HEAD September 25, 2017 and CT cervical spine January 24, 2016 FINDINGS: Moderately motion degraded examination. PHARYNX AND LARYNX: Normal.  Widely patent airway. SALIVARY GLANDS: Normal. THYROID: Normal. LYMPH NODES: No lymphadenopathy by CT size criteria, sensitivity decreased without intravenous contrast. VASCULAR: Moderate calcific atherosclerosis carotid siphon, carotid bifurcations and aortic arch. LIMITED INTRACRANIAL: Severe mesial temporal lobe atrophy compatible with neuro degenerative syndromes. VISUALIZED ORBITS: Status post bilateral ocular lens implants. MASTOIDS AND VISUALIZED PARANASAL SINUSES: Stable small RIGHT frontal sinus soft tissue mass with cortical dehiscence most compatible with mucocele. SKELETON: Nonacute. Patient is edentulous. Severe upper cervical spondylosis. Mild chronic T3 compression fracture. UPPER CHEST: Lung apices are clear. Mild fluid  distended esophagus with air-fluid level proximally. OTHER: None. IMPRESSION: 1. Motion degraded examination. No radiopaque foreign bodies. Widely patent airway. 2. Mild fluid distended esophagus. Electronically Signed   By: Awilda Metro M.D.   On: 12/01/2017 19:38    Procedures Procedures (including critical care time)  Medications Ordered in ED Medications  glucagon (human recombinant) (GLUCAGEN) injection 1 mg (1 mg Intramuscular Given 12/01/17 1833)     Initial Impression / Assessment and Plan / ED Course  I have reviewed the triage vital signs and the nursing notes as well as past medical history.  Pertinent labs & imaging results that were available during my  care of the patient were reviewed by me and considered in my medical decision making (see chart for details).  84 year old female who presents via facility for cough and copious drainage from mouth. Patient is demented at baseline and does not follow commands. Exam limited secondary to patient agitation. Concern for possible foreign body ingestion, obstruction or stricture. Patient is not in acute respiratory distress on initial evaluation, with RR 13 and oxygen 97% with good waveform. Will obtain plain film chest, labs and CT neck w/ contrast for foreign body and re-evaluate  Per nursing unable to obtain IV access. Will order Ct soft tissue without contrast and 1mg  IM glucagon.   I have tried two times to get in contact with living facility and their phone rings to a voicemail. Will continue to try to obtain further information.  Nursing staff attempted 2x for additional information. On reevaluation after glucagon patient  has been able to tolerate oral secretions. Chest plain film without infiltrates. Mild pulmonary congestion without overt pulmonary edema. CT neck with mildly fluid distended esophagus.  2040: Nursing staff notified provider of oxygen sats in the mid-80's. Placed on 3 L of oxygen on re-evaluation. Will consult with hospitalists for admission given new onset oxygen requirement.  Concern for aspiration pneumonia after glucagon. Will obtain labs. Oxygen sensor moved to patients ear.Tried to contact emergency contact and was unable to contact. Number rang with no voice mail.  2200: Patients family at bedside. Discussed plan. Per family, patient is at baseline mentation.  2100: Consulted with [facility] hospitalists who have seen patient and they agree for admission.  Patient was seen by my attending Dr. Estell Harpin who agrees with the plan for patient.   Final Clinical Impressions(s) / ED Diagnoses   Final diagnoses:  None    ED Discharge Orders    None       Patsy Zaragoza A,  PA-C 12/01/17 2323    Bethann Berkshire, MD 12/01/17 2334

## 2017-12-01 NOTE — ED Triage Notes (Signed)
Per EMS: Pt hx of alzheimer's and dementia.  responsive to pain, and is at baseline per facility.  Pt coughs a mouthful of mucus then spits it out on her shirt.

## 2017-12-02 ENCOUNTER — Other Ambulatory Visit: Payer: Self-pay

## 2017-12-02 DIAGNOSIS — E870 Hyperosmolality and hypernatremia: Secondary | ICD-10-CM | POA: Diagnosis not present

## 2017-12-02 DIAGNOSIS — R1310 Dysphagia, unspecified: Secondary | ICD-10-CM

## 2017-12-02 DIAGNOSIS — F0280 Dementia in other diseases classified elsewhere without behavioral disturbance: Secondary | ICD-10-CM

## 2017-12-02 DIAGNOSIS — G309 Alzheimer's disease, unspecified: Secondary | ICD-10-CM | POA: Diagnosis not present

## 2017-12-02 DIAGNOSIS — J189 Pneumonia, unspecified organism: Secondary | ICD-10-CM

## 2017-12-02 LAB — GLUCOSE, CAPILLARY
GLUCOSE-CAPILLARY: 95 mg/dL (ref 70–99)
GLUCOSE-CAPILLARY: 144 mg/dL — AB (ref 70–99)
GLUCOSE-CAPILLARY: 79 mg/dL (ref 70–99)
Glucose-Capillary: 124 mg/dL — ABNORMAL HIGH (ref 70–99)
Glucose-Capillary: 76 mg/dL (ref 70–99)

## 2017-12-02 LAB — BASIC METABOLIC PANEL
Anion gap: 11 (ref 5–15)
BUN: 18 mg/dL (ref 8–23)
CHLORIDE: 110 mmol/L (ref 98–111)
CO2: 25 mmol/L (ref 22–32)
CREATININE: 0.66 mg/dL (ref 0.44–1.00)
Calcium: 9.8 mg/dL (ref 8.9–10.3)
GLUCOSE: 145 mg/dL — AB (ref 70–99)
Potassium: 4.4 mmol/L (ref 3.5–5.1)
SODIUM: 146 mmol/L — AB (ref 135–145)

## 2017-12-02 LAB — MRSA PCR SCREENING: MRSA by PCR: NEGATIVE

## 2017-12-02 LAB — TSH: TSH: 0.972 u[IU]/mL (ref 0.350–4.500)

## 2017-12-02 MED ORDER — INSULIN ASPART 100 UNIT/ML ~~LOC~~ SOLN
0.0000 [IU] | Freq: Three times a day (TID) | SUBCUTANEOUS | Status: DC
  Administered 2017-12-02 (×2): 1 [IU] via SUBCUTANEOUS

## 2017-12-02 MED ORDER — ONDANSETRON HCL 4 MG PO TABS: 4.0000 mg | ORAL_TABLET | Freq: Four times a day (QID) | ORAL | Status: DC | PRN

## 2017-12-02 MED ORDER — HALOPERIDOL 0.5 MG PO TABS: 0.5000 mg | ORAL_TABLET | ORAL | Status: DC | PRN

## 2017-12-02 MED ORDER — DEXTROSE 5 % IV SOLN
INTRAVENOUS | Status: DC
  Administered 2017-12-02 – 2017-12-03 (×3): via INTRAVENOUS

## 2017-12-02 MED ORDER — INSULIN ASPART 100 UNIT/ML ~~LOC~~ SOLN: 0.0000 [IU] | Freq: Every day | SUBCUTANEOUS | Status: DC

## 2017-12-02 MED ORDER — LIP MEDEX EX OINT
TOPICAL_OINTMENT | CUTANEOUS | Status: AC
  Filled 2017-12-02: qty 7

## 2017-12-02 MED ORDER — HALOPERIDOL LACTATE 5 MG/ML IJ SOLN: 1.0000 mg | INTRAMUSCULAR | Status: DC | PRN

## 2017-12-02 MED ORDER — HEPARIN SODIUM (PORCINE) 5000 UNIT/ML IJ SOLN
5000.0000 [IU] | Freq: Two times a day (BID) | INTRAMUSCULAR | Status: DC
  Administered 2017-12-02 – 2017-12-06 (×8): 5000 [IU] via SUBCUTANEOUS
  Filled 2017-12-02 (×9): qty 1

## 2017-12-02 MED ORDER — ONDANSETRON HCL 4 MG/2ML IJ SOLN: 4.0000 mg | Freq: Four times a day (QID) | INTRAMUSCULAR | Status: DC | PRN

## 2017-12-02 NOTE — Progress Notes (Signed)
Report called by Maralyn SagoSarah, RN. Writer informed that pt does not have IV access. Writer informed by Maralyn SagoSarah, RN that she will attempt or have IV team to place an IV on pt. Awaiting pt arrival.

## 2017-12-02 NOTE — Progress Notes (Signed)
Bedside report given to Heidi, RN 

## 2017-12-02 NOTE — Consult Note (Signed)
Consultation Note Date: 12/02/2017   Patient Name: Pamela Ramirez  DOB: 10-Aug-1921  MRN: [id number]  Age / Sex: 84 y.o., female  PCP: Sande Brothers, MD Referring Physician: Debbe Odea, MD  Reason for Consultation: Establishing goals of care  HPI/Patient Profile: 84 y.o. female admitted on 12/01/2017    Clinical Assessment and Goals of Care:  84 year old lady who has been at [facility] memory care unit for the past 3 years. Patient has had a formal diagnosis of dementia for about 7-8 years now. Prior to that, she was improved detail facility. Patients daughter and who is her as indicated healthcare power of attorney agent is the primary historian present at the bedside. Patient has been admitted with productive cough, concern for aspiration episode. Patient has underlying history of chronic kidney disease. In July 2019 patient was admitted to the hospital for sepsis community-acquired pneumonia versus aspiration pneumonia. Patient's baseline is such that she  Is no longer verbal is no longer ambulatory. She does not even recognize family members. She was able to tolerate pured food at her facility without any swallowing difficulties.  Patient has been admitted to hospital medicine service, she has a not been awake/alert, she is to be evaluated by speech language pathology. Currently remains nothing by mouth. Workup at the time of admission also included a CT scan of the neck with concerns for foreign body. There was no foreign body. It did show severe temporal lobe atrophy.  Patient's serum albumin is 3.9, serum creatinine is 0.66.  Palliative consultation for ongoing goals of care discussions.  Patient is resting in bed. She is an elderly lady who is well-developed and well-nourished appearing. She does not appear to be in any acute distress. She does open her eyes when her name is called. She does not  verbalize. She does not follow commands. I met with the family at the bedside. Met with patient's daughter Pamela Ramirez who is her designated healthcare power of attorney agent. Granddaughter and patient's son-in-law were also present. I introduce myself and palliative care as follows:  Palliative medicine is specialized medical care for people living with serious illness. It focuses on providing relief from the symptoms and stress of a serious illness. The goal is to improve quality of life for both the patient and the family.  Discussed with daughter and family present at the bedside regarding decline trajectory from a dementia standpoint. Etiology of aspiration in the setting of advanced dementia also discussed in detail. Signs of ongoing decline from a dementia standpoint discussed in detail. Patient's daughter states that the patient has not had a good quality of life for some time now. She is nonverbal and nonambulatory at baseline. Family states that she does not even recognize her family members anymore. We discussed about comfort measures. We discussed about different levels of hospice care. Please note additional discussions/recommendations listed below. Thank you for the consult.   HCPOA  daughter Pamela Ramirez is HCPOA agent.  Patient has 7 kids.   SUMMARY OF RECOMMENDATIONS    DNR  DNI.  Appreciate SLP eval and input.  Worry that the patient might be approaching end of life, might not be able to eat/drink enough to sustain herself long term, due to advanced dementia with ongoing decline. Family is clear about no artificial means of nutrition such as PEG or Dobbhoff, overall, their goals are towards comfort measures.  Patient might benefit from residential hospice on discharge.   Code Status/Advance Care Planning:  DNR    Symptom Management:    continue current mode of care.   Palliative Prophylaxis:   Delirium Protocol   Psycho-social/Spiritual:   Desire for further Chaplaincy  support:yes  Additional Recommendations: Education on Hospice  Prognosis:   Likely less than 2 weeks.   Discharge Planning: Patient might most benefit from comfort measures, residential hospice at discharge. Continue to monitor hospital course.       Primary Diagnoses: Present on Admission: . Hypernatremia   I have reviewed the medical record, interviewed the patient and family, and examined the patient. The following aspects are pertinent.  Past Medical History:  Diagnosis Date  . Anxiety   . Chronic kidney disease   . Dementia   . Diabetes type 2, controlled ([place])   . GERD (gastroesophageal reflux disease)   . Osteoporosis    Social History   Socioeconomic History  . Marital status: Widowed    Spouse name: Not on file  . Number of children: Not on file  . Years of education: Not on file  . Highest education level: Not on file  Occupational History  . Not on file  Social Needs  . Financial resource strain: Not on file  . Food insecurity:    Worry: Not on file    Inability: Not on file  . Transportation needs:    Medical: Not on file    Non-medical: Not on file  Tobacco Use  . Smoking status: Never Smoker  . Smokeless tobacco: Never Used  Substance and Sexual Activity  . Alcohol use: No  . Drug use: No  . Sexual activity: Not on file  Lifestyle  . Physical activity:    Days per week: Not on file    Minutes per session: Not on file  . Stress: Not on file  Relationships  . Social connections:    Talks on phone: Not on file    Gets together: Not on file    Attends religious service: Not on file    Active member of club or organization: Not on file    Attends meetings of clubs or organizations: Not on file    Relationship status: Not on file  Other Topics Concern  . Not on file  Social History Narrative  . Not on file   History reviewed. No pertinent family history. Scheduled Meds: . heparin  5,000 Units Subcutaneous BID  . insulin aspart  0-5  Units Subcutaneous QHS  . insulin aspart  0-9 Units Subcutaneous TID WC  . lip balm       Continuous Infusions: . dextrose 75 mL/hr at 12/02/17 0206   PRN Meds:.haloperidol **OR** haloperidol lactate, ondansetron **OR** ondansetron (ZOFRAN) IV Medications Prior to Admission:  Prior to Admission medications   Medication Sig Start Date End Date Taking? Authorizing Provider  acetaminophen (TYLENOL) 500 MG tablet Take 500 mg by mouth every 4 (four) hours as needed (For fever 99.5 to 101F, call MD if above 101F, headache or minor discomfort.).    Yes [provider]  acetaminophen (TYLENOL) 650 MG CR tablet Take  650 mg by mouth 3 (three) times daily.   Yes [provider]  alum & mag hydroxide-simeth (MAALOX/MYLANTA) 200-200-20 MG/5ML suspension Take 30 mLs by mouth every 6 (six) hours as needed for indigestion or heartburn (Not to exceed 4 doses in 24 hours.).    Yes [provider]  divalproex (DEPAKOTE SPRINKLE) 125 MG capsule Take 250 mg by mouth 3 (three) times daily.    Yes [provider]  guaifenesin (ROBITUSSIN) 100 MG/5ML syrup Take 200 mg by mouth every 6 (six) hours as needed for cough (Do not exceed 4 doses in 24 hours.).    Yes [provider]  loperamide (IMODIUM) 2 MG capsule Take 2 mg by mouth as needed for diarrhea or loose stools (Not to exceed 8 doses in 24 hours.).    Yes [provider]  magnesium hydroxide (MILK OF MAGNESIA) 400 MG/5ML suspension Take 30 mLs by mouth at bedtime as needed (For constipation.).    Yes [provider]  Neomycin-Bacitracin-Polymyxin (TRIPLE ANTIBIOTIC) [id number] OINT Apply 1 application topically as needed (For minor skin tears or abrasions.).   Yes [provider]  risperiDONE (RISPERDAL) 0.5 MG tablet Take 0.5 mg by mouth 3 (three) times daily as needed (for agitation).   Yes [provider]  Skin Protectants, Misc. (BAZA PROTECT EX) Apply 1 application topically as  needed (For buttocks after each incontinence episode.).   Yes [provider]  Skin Protectants, Misc. (EUCERIN) cream Apply 1 application topically 2 (two) times daily.   Yes [provider]   No Known Allergies Review of Systems Weakness  Physical Exam Elderly lady with dementia She is non verbal and non ambulatory at baseline Clear shallow breath sounds S 1 S 2  She doesn't have abd distension Trace edema  Vital Signs: BP (!) 148/59 (BP Location: Right Leg)   Pulse 89   Temp 98.9 F (37.2 C) (Oral)   Resp 16   Ht 5' 2"  (1.575 m)   Wt 70.8 kg   SpO2 99%   BMI 28.55 kg/m  Pain Scale: PAINAD       SpO2: SpO2: 99 % O2 Device:SpO2: 99 % O2 Flow Rate: .O2 Flow Rate (L/min): 3 L/min  IO: Intake/output summary:   Intake/Output Summary (Last 24 hours) at 12/02/2017 1524 Last data filed at 12/02/2017 0700 Gross per 24 hour  Intake 367.39 ml  Output 150 ml  Net 217.39 ml    LBM: Last BM Date: 12/01/17 Baseline Weight: Weight: 72.6 kg Most recent weight: Weight: 70.8 kg     Palliative Assessment/Data:    PPS 10%  Time In: 1400 Time Out: 1510 Time Total: 70 minutes.   Greater than 50%  of this time was spent counseling and coordinating care related to the above assessment and plan.  Signed by: Loistine Chance, MD [phone]  Please contact Palliative Medicine Team phone at [phone] for questions and concerns.  For individual provider: See Shea Evans

## 2017-12-02 NOTE — Progress Notes (Signed)
PROGRESS NOTE    Pamela Ramirez   WUJ:[id number]:[name]  DOB: 05-12-1921  DOA: 12/01/2017 PCP: Ron ParkerBowen, Samuel, MD   Brief Narrative:  Pamela Ramirez  is a 84 y.o. female, with past medical history significant for diabetes mellitus, alzheimer's dementia  who was sent from the nursing home for evaluation of cough and excessive mucus production. She has been noted to have increased dysphagia at the facility where she has been residing. In the ED, she is noted to be hypernatremic with a sodium of 151.    Subjective: Nonverbal. Her daughters are at bedside and we have had and extensive conversation. They are feeling that she needs to be transitioned to [facility] at this point as she no longer has any quality of life.     Assessment & Plan:   Principal Problem:   Hypernatremia - improving on D5W  Active Problems:   Alzheimer's dementia - severe to the point that she is not verbal and is total care- palliative care involved - family is discussing transitioning to comfort care  Dysphagia? - f/u on D1 thin liquids are recommended by speech therapist today   DVT prophylaxis: Heparin Code Status: DNR Family Communication: daughters at bedside Disposition Plan: to be determined Consultants:   Palliative care Procedures:   none Antimicrobials:  Anti-infectives (From admission, onward)   None       Objective: Vitals:   12/02/17 0142 12/02/17 0150 12/02/17 0500 12/02/17 1416  BP:  (!) 144/44 134/64 (!) 148/59  Pulse:  94 82 89  Resp:  18 16 16   Temp:  97.8 F (36.6 C) 97.9 F (36.6 C) 98.9 F (37.2 C)  TempSrc:  Axillary Axillary Oral  SpO2:  94% 97% 99%  Weight: 70.8 kg     Height: 5\' 2"  (1.575 m)       Intake/Output Summary (Last 24 hours) at 12/02/2017 1512 Last data filed at 12/02/2017 0700 Gross per 24 hour  Intake 367.39 ml  Output 150 ml  Net 217.39 ml   Filed Weights   12/01/17 1649 12/02/17 0142  Weight: 72.6 kg 70.8 kg    Examination: General  exam: Appears comfortable  HEENT: PERRL  Respiratory system: Clear to auscultation. Respiratory effort normal. Cardiovascular system: S1 & S2 heard, RRR.   Gastrointestinal system: Abdomen soft, non-tender, nondistended. Normal bowel sound. No organomegaly Central nervous system: asleep- unable to assess Extremities: No cyanosis, clubbing or edema Skin: No rashes or ulcers Psychiatry:  Cannot assess    Data Reviewed: I have personally reviewed following labs and imaging studies  CBC: Recent Labs  Lab 12/01/17 2107 12/01/17 2113  WBC 5.3  --   NEUTROABS 3.6  --   HGB 13.1 13.9  HCT 40.6 41.0  MCV 97.6  --   PLT 262  --    Basic Metabolic Panel: Recent Labs  Lab 12/01/17 2107 12/01/17 2113 12/02/17 0424  NA 151* 147* 146*  K 5.1 4.8 4.4  CL 111 108 110  CO2 33*  --  25  GLUCOSE 128* 124* 145*  BUN 19 19 18   CREATININE 0.78 0.80 0.66  CALCIUM 10.4*  --  9.8   GFR: Estimated Creatinine Clearance: 39.6 mL/min (by C-G formula based on SCr of 0.66 mg/dL). Liver Function Tests: Recent Labs  Lab 12/01/17 2107  AST 20  ALT 24  ALKPHOS 77  BILITOT 0.6  PROT 7.6  ALBUMIN 3.9   No results for input(s): LIPASE, AMYLASE in the last 168 hours. No results for  input(s): AMMONIA in the last 168 hours. Coagulation Profile: No results for input(s): INR, PROTIME in the last 168 hours. Cardiac Enzymes: No results for input(s): CKTOTAL, CKMB, CKMBINDEX, TROPONINI in the last 168 hours. BNP (last 3 results) No results for input(s): PROBNP in the last 8760 hours. HbA1C: No results for input(s): HGBA1C in the last 72 hours. CBG: Recent Labs  Lab 12/02/17 0147 12/02/17 0751 12/02/17 1151  GLUCAP 95 144* 124*   Lipid Profile: No results for input(s): CHOL, HDL, LDLCALC, TRIG, CHOLHDL, LDLDIRECT in the last 72 hours. Thyroid Function Tests: Recent Labs    12/02/17 0424  TSH 0.972   Anemia Panel: No results for input(s): VITAMINB12, FOLATE, FERRITIN, TIBC, IRON,  RETICCTPCT in the last 72 hours. Urine analysis:    Component Value Date/Time   COLORURINE YELLOW 11/11/2017 2046   APPEARANCEUR CLEAR 11/11/2017 2046   LABSPEC 1.017 11/11/2017 2046   PHURINE 6.0 11/11/2017 2046   GLUCOSEU NEGATIVE 11/11/2017 2046   HGBUR NEGATIVE 11/11/2017 2046   BILIRUBINUR NEGATIVE 11/11/2017 2046   KETONESUR NEGATIVE 11/11/2017 2046   PROTEINUR NEGATIVE 11/11/2017 2046   UROBILINOGEN 0.2 01/25/2015 1550   NITRITE NEGATIVE 11/11/2017 2046   LEUKOCYTESUR NEGATIVE 11/11/2017 2046   Sepsis Labs: @LABRCNTIP (procalcitonin:4,lacticidven:4) ) Recent Results (from the past 240 hour(s))  MRSA PCR Screening     Status: None   Collection Time: 12/02/17  6:31 AM  Result Value Ref Range Status   MRSA by PCR NEGATIVE NEGATIVE Final    Comment:        The GeneXpert MRSA Assay (FDA approved for NASAL specimens only), is one component of a comprehensive MRSA colonization surveillance program. It is not intended to diagnose MRSA infection nor to guide or monitor treatment for MRSA infections. Performed at [facility], [address] [place], [place] [address]          Radiology Studies: Dg Chest 2 View  Result Date: 12/01/2017 CLINICAL DATA:  Dementia.  Cough. EXAM: CHEST - 2 VIEW COMPARISON:  11/11/2017 FINDINGS: The lungs are clear without focal pneumonia, edema, pneumothorax or pleural effusion. The cardiopericardial silhouette is within normal limits for size. There is pulmonary vascular congestion without overt pulmonary edema. The visualized bony structures of the thorax are intact. Telemetry leads overlie the chest. IMPRESSION: Vascular congestion without acute cardiopulmonary findings. Electronically Signed   By: Kennith Center M.D.   On: 12/01/2017 18:00   Ct Soft Tissue Neck Wo Contrast  Result Date: 12/01/2017 CLINICAL DATA:  Spitting up mucus, assess for foreign body. History of dementia. EXAM: CT NECK WITHOUT CONTRAST TECHNIQUE:  Multidetector CT imaging of the neck was performed following the standard protocol without intravenous contrast. COMPARISON:  CT HEAD September 25, 2017 and CT cervical spine January 24, 2016 FINDINGS: Moderately motion degraded examination. PHARYNX AND LARYNX: Normal.  Widely patent airway. SALIVARY GLANDS: Normal. THYROID: Normal. LYMPH NODES: No lymphadenopathy by CT size criteria, sensitivity decreased without intravenous contrast. VASCULAR: Moderate calcific atherosclerosis carotid siphon, carotid bifurcations and aortic arch. LIMITED INTRACRANIAL: Severe mesial temporal lobe atrophy compatible with neuro degenerative syndromes. VISUALIZED ORBITS: Status post bilateral ocular lens implants. MASTOIDS AND VISUALIZED PARANASAL SINUSES: Stable small RIGHT frontal sinus soft tissue mass with cortical dehiscence most compatible with mucocele. SKELETON: Nonacute. Patient is edentulous. Severe upper cervical spondylosis. Mild chronic T3 compression fracture. UPPER CHEST: Lung apices are clear. Mild fluid distended esophagus with air-fluid level proximally. OTHER: None. IMPRESSION: 1. Motion degraded examination. No radiopaque foreign bodies. Widely patent airway. 2. Mild  fluid distended esophagus. Electronically Signed   By: Awilda Metro M.D.   On: 12/01/2017 19:38      Scheduled Meds: . heparin  5,000 Units Subcutaneous BID  . insulin aspart  0-5 Units Subcutaneous QHS  . insulin aspart  0-9 Units Subcutaneous TID WC  . lip balm       Continuous Infusions: . dextrose 75 mL/hr at 12/02/17 0206     LOS: 0 days    Time spent in minutes: 35    Calvert Cantor, MD [facility] Pager: [email or web address] Password [email or web address] 12/02/2017, 3:12 PM

## 2017-12-02 NOTE — Progress Notes (Signed)
Nutrition Brief Note  Chart reviewed. Pt now transitioning to comfort measures.  No further nutrition interventions warranted at this time.  Please re-consult as needed.   Donovyn Guidice RD, LDN Clinical Nutrition Pager # - [phone]    

## 2017-12-02 NOTE — Evaluation (Signed)
Clinical/Bedside Swallow Evaluation Patient Details  Name: Pamela Ramirez MRN: [id number] Date of Birth: 05-15-1921  Today's Date: 12/02/2017 Time: SLP Start Time (ACUTE ONLY): 1135 SLP Stop Time (ACUTE ONLY): 1155 SLP Time Calculation (min) (ACUTE ONLY): 20 min  Past Medical History:  Past Medical History:  Diagnosis Date  . Anxiety   . Chronic kidney disease   . Dementia   . Diabetes type 2, controlled ([place])   . GERD (gastroesophageal reflux disease)   . Osteoporosis    Past Surgical History: History reviewed. No pertinent surgical history. HPI:  84 y.o. female, with past medical history significant for diabetes mellitus, dementia, GERD, and chronic kidney disease who was sent from the nursing home for evaluation of cough and excessive mucus production, worsening dysphagia.  Pt seen for swallow evaluation 09/26/17; recs at that time were for dysphagia 1/pureed diet and thin liquids.  Pt requires total hand-feeding, is nonverbal, dependent for care at baseline.   Assessment / Plan / Recommendation Clinical Impression  Pt's swallow function appears to be similar to clinical findings after July 2019 evaluation.  She is nonverbal, does not follow commands, and maintained eyes closed for duration of session.  She demonstrated recognition of spoon/straw - actively drank water from a straw, accepted pudding from a spoon.  She had a prolonged oral phase, but consistently generated a swallow response with no overt s/s of aspiration.  Daughter states her behavior today is closer to baseline. She was not producing phlegm or coughing during assessment; there was a notable expiratory wheeze that was present throughout assessment.  Queried family about their understanding of swallowing and advanced dementia;  they acknowledged that safety of swallowing deteriorates as the disease progresses.  We discussed allowing pt to resume usual diet given no acute changes today with swallowing, with the  understanding that Ms. Clegg continues to be at risk for aspirating, as well as poor intake, given her chronic condition.  Family verbalizes understanding - they have met with Palliative Medicine and plan to speak with Dr. Rowe Pavy again.  Recommend dysphagia 1 diet, thin liquids (straws fine), small sips, crush meds; careful hand-feeding.  SLP Visit Diagnosis: Dysphagia, unspecified (R13.10)    Aspiration Risk  Moderate aspiration risk    Diet Recommendation   dysphagia 1, thin liquids  Medication Administration: Crushed with puree    Other  Recommendations Oral Care Recommendations: Oral care BID   Follow up Recommendations None      Frequency and Duration            Prognosis        Swallow Study   General HPI: 84 y.o. female, with past medical history significant for diabetes mellitus, dementia, GERD, and chronic kidney disease who was sent from the nursing home for evaluation of cough and excessive mucus production, worsening dysphagia.  Pt seen for swallow evaluation 09/26/17; recs at that time were for dysphagia 1/pureed diet and thin liquids (baseline diet for unknown period of time).  Type of Study: Bedside Swallow Evaluation Previous Swallow Assessment: see HPI Diet Prior to this Study: NPO Temperature Spikes Noted: No Respiratory Status: Nasal cannula History of Recent Intubation: No Behavior/Cognition: Alert Oral Cavity Assessment: Within Functional Limits Oral Care Completed by SLP: Recent completion by staff Vision: Impaired for self-feeding Self-Feeding Abilities: Total assist Patient Positioning: Upright in bed Baseline Vocal Quality: Not observed Volitional Cough: Cognitively unable to elicit Volitional Swallow: Unable to elicit    Oral/Motor/Sensory Function Overall Oral Motor/Sensory Function: (symmetric at baseline)  Ice Chips Ice chips: Within functional limits   Thin Liquid Thin Liquid: Within functional limits Presentation: Straw    Nectar Thick  Nectar Thick Liquid: Not tested   Honey Thick Honey Thick Liquid: Not tested   Puree Puree: Impaired Presentation: Spoon Oral Phase Functional Implications: Prolonged oral transit   Solid     Solid: Not tested      Juan Quam Laurice 12/02/2017,12:07 PM   .Estill Bamberg L. Tivis Ringer, [place] Office number [phone]

## 2017-12-02 NOTE — Progress Notes (Signed)
Late entry: Pt admitted to 1612. Pt had no IV access upon arrival, nursing staff to attempt IV access. Pt daughter Dewayne Hatchnn and Ann's husband accompanied pt. Pt mostly non verbal per family. Writer did admission questions with pt daughter Dewayne Hatchnn. Pt daughter states she will look for Advance Directive papers, pt daughter unsure of what pt has. See doc flow sheet for VS and skin assessment.

## 2017-12-03 DIAGNOSIS — R1310 Dysphagia, unspecified: Secondary | ICD-10-CM | POA: Diagnosis present

## 2017-12-03 DIAGNOSIS — Z79899 Other long term (current) drug therapy: Secondary | ICD-10-CM | POA: Diagnosis not present

## 2017-12-03 DIAGNOSIS — X58XXXA Exposure to other specified factors, initial encounter: Secondary | ICD-10-CM | POA: Diagnosis present

## 2017-12-03 DIAGNOSIS — J988 Other specified respiratory disorders: Secondary | ICD-10-CM | POA: Diagnosis present

## 2017-12-03 DIAGNOSIS — M810 Age-related osteoporosis without current pathological fracture: Secondary | ICD-10-CM | POA: Diagnosis present

## 2017-12-03 DIAGNOSIS — R0902 Hypoxemia: Secondary | ICD-10-CM | POA: Diagnosis present

## 2017-12-03 DIAGNOSIS — Z66 Do not resuscitate: Secondary | ICD-10-CM | POA: Diagnosis present

## 2017-12-03 DIAGNOSIS — E870 Hyperosmolality and hypernatremia: Secondary | ICD-10-CM | POA: Diagnosis not present

## 2017-12-03 DIAGNOSIS — G309 Alzheimer's disease, unspecified: Secondary | ICD-10-CM | POA: Diagnosis not present

## 2017-12-03 DIAGNOSIS — L89312 Pressure ulcer of right buttock, stage 2: Secondary | ICD-10-CM | POA: Diagnosis present

## 2017-12-03 DIAGNOSIS — D631 Anemia in chronic kidney disease: Secondary | ICD-10-CM | POA: Diagnosis present

## 2017-12-03 DIAGNOSIS — Z86711 Personal history of pulmonary embolism: Secondary | ICD-10-CM | POA: Diagnosis not present

## 2017-12-03 DIAGNOSIS — K219 Gastro-esophageal reflux disease without esophagitis: Secondary | ICD-10-CM | POA: Diagnosis present

## 2017-12-03 DIAGNOSIS — N183 Chronic kidney disease, stage 3 (moderate): Secondary | ICD-10-CM | POA: Diagnosis present

## 2017-12-03 DIAGNOSIS — Z515 Encounter for palliative care: Secondary | ICD-10-CM | POA: Diagnosis not present

## 2017-12-03 DIAGNOSIS — F0280 Dementia in other diseases classified elsewhere without behavioral disturbance: Secondary | ICD-10-CM | POA: Diagnosis present

## 2017-12-03 DIAGNOSIS — E1122 Type 2 diabetes mellitus with diabetic chronic kidney disease: Secondary | ICD-10-CM | POA: Diagnosis present

## 2017-12-03 DIAGNOSIS — I509 Heart failure, unspecified: Secondary | ICD-10-CM | POA: Diagnosis present

## 2017-12-03 DIAGNOSIS — F419 Anxiety disorder, unspecified: Secondary | ICD-10-CM | POA: Diagnosis present

## 2017-12-03 DIAGNOSIS — S9032XA Contusion of left foot, initial encounter: Secondary | ICD-10-CM | POA: Diagnosis present

## 2017-12-03 DIAGNOSIS — L8990 Pressure ulcer of unspecified site, unspecified stage: Secondary | ICD-10-CM

## 2017-12-03 LAB — BASIC METABOLIC PANEL
ANION GAP: 8 (ref 5–15)
BUN: 18 mg/dL (ref 8–23)
CHLORIDE: 105 mmol/L (ref 98–111)
CO2: 29 mmol/L (ref 22–32)
Calcium: 9.3 mg/dL (ref 8.9–10.3)
Creatinine, Ser: 0.81 mg/dL (ref 0.44–1.00)
GFR calc Af Amer: >60 mL/min (ref >60)
Glucose, Bld: 83 mg/dL (ref 70–99)
POTASSIUM: 3.8 mmol/L (ref 3.5–5.1)
SODIUM: 142 mmol/L (ref 135–145)

## 2017-12-03 LAB — GLUCOSE, CAPILLARY
GLUCOSE-CAPILLARY: 74 mg/dL (ref 70–99)
GLUCOSE-CAPILLARY: 90 mg/dL (ref 70–99)
GLUCOSE-CAPILLARY: 84 mg/dL (ref 70–99)
Glucose-Capillary: 80 mg/dL (ref 70–99)
Glucose-Capillary: 99 mg/dL (ref 70–99)

## 2017-12-03 MED ORDER — MORPHINE 100MG IN NS 100ML (1MG/ML) PREMIX INFUSION
1.0000 mg/h | INTRAVENOUS | Status: DC
  Administered 2017-12-03 – 2017-12-05 (×2): 1 mg/h via INTRAVENOUS
  Filled 2017-12-03 (×2): qty 100

## 2017-12-03 MED ORDER — IPRATROPIUM-ALBUTEROL 0.5-2.5 (3) MG/3ML IN SOLN
3.0000 mL | Freq: Four times a day (QID) | RESPIRATORY_TRACT | Status: DC
  Administered 2017-12-03 – 2017-12-05 (×11): 3 mL via RESPIRATORY_TRACT
  Filled 2017-12-03 (×10): qty 3

## 2017-12-03 MED ORDER — SODIUM CHLORIDE 0.9 % IV SOLN
INTRAVENOUS | Status: DC
  Administered 2017-12-03 – 2017-12-04 (×2): via INTRAVENOUS

## 2017-12-03 NOTE — Progress Notes (Signed)
PMT progress note  Patient is resting in bed, she is non verbal at baseline She is in no distress Oral intake continues to be minimal  BP (!) 74/44 (BP Location: Right Arm)   Pulse 74   Temp (!) 97.5 F (36.4 C) (Oral)   Resp 17   Ht 5\' 2"  (1.575 m)   Wt 70.8 kg   SpO2 96%   BMI 28.55 kg/m  Labs and imaging noted  Awake but restless Non verbal Regular Shallow clear Abdomen is not distended She has no edema She has advanced dementia  Appreciate SLP evaluation and input.   PPS 30%  Hypernatremia Advanced dementia with current further progressive decline Dysphagia.   Call placed and discussed with HCPOA daughter Dewayne Hatchnn.   Recommend residential hospice Prognosis likely less than 2 weeks CSW consulted to help facilitate.   25 minutes spent Rosalin HawkingZeba Jadie Allington MD [facility] team [phone]  [phone]

## 2017-12-03 NOTE — Progress Notes (Signed)
Pt. Noted to have audible expiratory wheezing with prolonged forced expiratory phase.  HHN offered no relief.  RN in room and will contact MD for additional medications for comfort.

## 2017-12-03 NOTE — Progress Notes (Addendum)
PROGRESS NOTE    Pamela Ramirez   ZOX:[id number]  DOB: 08-16-1921  DOA: 12/01/2017 PCP: Ron Parker, MD   Brief Narrative:  Pamela Ramirez  is a 84 y.o. female, with past medical history significant for diabetes mellitus, alzheimer's dementia  who was sent from the nursing home for evaluation of cough and excessive mucus production. She has been noted to have increased dysphagia at the facility where she has been residing. In the ED, she is noted to be hypernatremic with a sodium of 151.    Subjective: Nonverbal.    Assessment & Plan:   Principal Problem:   Hypernatremia - improving on D5W- can change to NS at this point as oral intake is still very poor  Active Problems:   Alzheimer's dementia - severe to the point that she is not verbal and is total care- palliative care involved - family is discussing transitioning to comfort care  Dysphagia? - f/u on D1 thin liquids are recommended by speech therapist - she is not eating or drinking enough to maintain basic body functions-   Disposition: residential hospice recommended   DVT prophylaxis: Heparin Code Status: DNR Family Communication: daughters at bedside Disposition Plan: to be determined Consultants:   Palliative care Procedures:   none Antimicrobials:  Anti-infectives (From admission, onward)   None       Objective: Vitals:   12/02/17 2238 12/03/17 0549 12/03/17 0759 12/03/17 0803  BP: 118/62 (!) 74/44    Pulse: 80 74    Resp: 20 17    Temp: 98.1 F (36.7 C) (!) 97.5 F (36.4 C)    TempSrc: Oral Oral    SpO2: 100% 97% 96% 96%  Weight:      Height:        Intake/Output Summary (Last 24 hours) at 12/03/2017 1258 Last data filed at 12/03/2017 0600 Gross per 24 hour  Intake 1929.21 ml  Output 850 ml  Net 1079.21 ml   Filed Weights   12/01/17 1649 12/02/17 0142  Weight: 72.6 kg 70.8 kg    Examination: General exam: Appears comfortable - will not open eyes or follow commands- non  verbal HEENT: PERRL  Respiratory system: Clear to auscultation. Respiratory effort normal. Cardiovascular system: S1 & S2 heard, RRR.   Gastrointestinal system: Abdomen soft, non-tender, nondistended. Normal bowel sound. No organomegaly Central nervous system:  unable to assess Extremities: No cyanosis, clubbing or edema Skin: No rashes or ulcers Psychiatry:  Cannot assess    Data Reviewed: I have personally reviewed following labs and imaging studies  CBC: Recent Labs  Lab 12/01/17 2107 12/01/17 2113  WBC 5.3  --   NEUTROABS 3.6  --   HGB 13.1 13.9  HCT 40.6 41.0  MCV 97.6  --   PLT 262  --    Basic Metabolic Panel: Recent Labs  Lab 12/01/17 2107 12/01/17 2113 12/02/17 0424 12/03/17 0542  NA 151* 147* 146* 142  K 5.1 4.8 4.4 3.8  CL 111 108 110 105  CO2 33*  --  25 29  GLUCOSE 128* 124* 145* 83  BUN 19 19 18 18   CREATININE 0.78 0.80 0.66 0.81  CALCIUM 10.4*  --  9.8 9.3   GFR: Estimated Creatinine Clearance: 39.2 mL/min (by C-G formula based on SCr of 0.81 mg/dL). Liver Function Tests: Recent Labs  Lab 12/01/17 2107  AST 20  ALT 24  ALKPHOS 77  BILITOT 0.6  PROT 7.6  ALBUMIN 3.9   No results for input(s): LIPASE, AMYLASE in  the last 168 hours. No results for input(s): AMMONIA in the last 168 hours. Coagulation Profile: No results for input(s): INR, PROTIME in the last 168 hours. Cardiac Enzymes: No results for input(s): CKTOTAL, CKMB, CKMBINDEX, TROPONINI in the last 168 hours. BNP (last 3 results) No results for input(s): PROBNP in the last 8760 hours. HbA1C: No results for input(s): HGBA1C in the last 72 hours. CBG: Recent Labs  Lab 12/02/17 1658 12/02/17 2235 12/03/17 0058 12/03/17 0735 12/03/17 1143  GLUCAP 76 79 80 74 99   Lipid Profile: No results for input(s): CHOL, HDL, LDLCALC, TRIG, CHOLHDL, LDLDIRECT in the last 72 hours. Thyroid Function Tests: Recent Labs    12/02/17 0424  TSH 0.972   Anemia Panel: No results for  input(s): VITAMINB12, FOLATE, FERRITIN, TIBC, IRON, RETICCTPCT in the last 72 hours. Urine analysis:    Component Value Date/Time   COLORURINE YELLOW 11/11/2017 2046   APPEARANCEUR CLEAR 11/11/2017 2046   LABSPEC 1.017 11/11/2017 2046   PHURINE 6.0 11/11/2017 2046   GLUCOSEU NEGATIVE 11/11/2017 2046   HGBUR NEGATIVE 11/11/2017 2046   BILIRUBINUR NEGATIVE 11/11/2017 2046   KETONESUR NEGATIVE 11/11/2017 2046   PROTEINUR NEGATIVE 11/11/2017 2046   UROBILINOGEN 0.2 01/25/2015 1550   NITRITE NEGATIVE 11/11/2017 2046   LEUKOCYTESUR NEGATIVE 11/11/2017 2046   Sepsis Labs: @LABRCNTIP (procalcitonin:4,lacticidven:4) ) Recent Results (from the past 240 hour(s))  MRSA PCR Screening     Status: None   Collection Time: 12/02/17  6:31 AM  Result Value Ref Range Status   MRSA by PCR NEGATIVE NEGATIVE Final    Comment:        The GeneXpert MRSA Assay (FDA approved for NASAL specimens only), is one component of a comprehensive MRSA colonization surveillance program. It is not intended to diagnose MRSA infection nor to guide or monitor treatment for MRSA infections. Performed at [facility], [address], [place], [place] [address]          Radiology Studies: Dg Chest 2 View  Result Date: 12/01/2017 CLINICAL DATA:  Dementia.  Cough. EXAM: CHEST - 2 VIEW COMPARISON:  11/11/2017 FINDINGS: The lungs are clear without focal pneumonia, edema, pneumothorax or pleural effusion. The cardiopericardial silhouette is within normal limits for size. There is pulmonary vascular congestion without overt pulmonary edema. The visualized bony structures of the thorax are intact. Telemetry leads overlie the chest. IMPRESSION: Vascular congestion without acute cardiopulmonary findings. Electronically Signed   By: Kennith Center M.D.   On: 12/01/2017 18:00   Ct Soft Tissue Neck Wo Contrast  Result Date: 12/01/2017 CLINICAL DATA:  Spitting up mucus, assess for foreign body. History of  dementia. EXAM: CT NECK WITHOUT CONTRAST TECHNIQUE: Multidetector CT imaging of the neck was performed following the standard protocol without intravenous contrast. COMPARISON:  CT HEAD September 25, 2017 and CT cervical spine January 24, 2016 FINDINGS: Moderately motion degraded examination. PHARYNX AND LARYNX: Normal.  Widely patent airway. SALIVARY GLANDS: Normal. THYROID: Normal. LYMPH NODES: No lymphadenopathy by CT size criteria, sensitivity decreased without intravenous contrast. VASCULAR: Moderate calcific atherosclerosis carotid siphon, carotid bifurcations and aortic arch. LIMITED INTRACRANIAL: Severe mesial temporal lobe atrophy compatible with neuro degenerative syndromes. VISUALIZED ORBITS: Status post bilateral ocular lens implants. MASTOIDS AND VISUALIZED PARANASAL SINUSES: Stable small RIGHT frontal sinus soft tissue mass with cortical dehiscence most compatible with mucocele. SKELETON: Nonacute. Patient is edentulous. Severe upper cervical spondylosis. Mild chronic T3 compression fracture. UPPER CHEST: Lung apices are clear. Mild fluid distended esophagus with air-fluid level proximally. OTHER: None. IMPRESSION:  1. Motion degraded examination. No radiopaque foreign bodies. Widely patent airway. 2. Mild fluid distended esophagus. Electronically Signed   By: Awilda Metroourtnay  Bloomer M.D.   On: 12/01/2017 19:38      Scheduled Meds: . heparin  5,000 Units Subcutaneous BID  . insulin aspart  0-5 Units Subcutaneous QHS  . insulin aspart  0-9 Units Subcutaneous TID WC  . ipratropium-albuterol  3 mL Nebulization Q6H   Continuous Infusions: . dextrose 75 mL/hr at 12/03/17 0600     LOS: 0 days    Time spent in minutes: 35    Calvert CantorSaima Johnae Friley, MD [facility] Pager: [email or web address] Password [email or web address] 12/03/2017, 12:58 PM

## 2017-12-04 LAB — GLUCOSE, CAPILLARY
GLUCOSE-CAPILLARY: 65 mg/dL — AB (ref 70–99)
GLUCOSE-CAPILLARY: 55 mg/dL — AB (ref 70–99)
GLUCOSE-CAPILLARY: 108 mg/dL — AB (ref 70–99)
Glucose-Capillary: 59 mg/dL — ABNORMAL LOW (ref 70–99)

## 2017-12-04 MED ORDER — GLUCAGON HCL RDNA (DIAGNOSTIC) 1 MG IJ SOLR
INTRAMUSCULAR | Status: AC
  Administered 2017-12-04: 1 mg
  Filled 2017-12-04: qty 1

## 2017-12-04 MED ORDER — DEXTROSE 50 % IV SOLN: 25.0000 mL | Freq: Once | INTRAVENOUS | Status: AC

## 2017-12-04 MED ORDER — DEXTROSE 5 % IV SOLN
INTRAVENOUS | Status: DC
  Administered 2017-12-04: 23:00:00 via INTRAVENOUS
  Administered 2017-12-05: 1000 mL via INTRAVENOUS

## 2017-12-04 MED ORDER — SCOPOLAMINE 1 MG/3DAYS TD PT72
1.0000 | MEDICATED_PATCH | TRANSDERMAL | Status: DC
  Administered 2017-12-04: 1.5 mg via TRANSDERMAL
  Filled 2017-12-04: qty 1

## 2017-12-04 MED ORDER — DEXTROSE 50 % IV SOLN
INTRAVENOUS | Status: AC
  Administered 2017-12-04: 25 mL
  Filled 2017-12-04: qty 50

## 2017-12-04 NOTE — Progress Notes (Signed)
PMT progress note  Patient is resting in bed, she is non verbal at baseline She is in no distress Oral intake continues to be minimal  BP 122/64 (BP Location: Left Arm)   Pulse 80   Temp 98.3 F (36.8 C) (Oral)   Resp 16   Ht 5\' 2"  (1.575 m)   Wt 70.8 kg   SpO2 95%   BMI 28.55 kg/m  Labs and imaging noted  Awake but restless Non verbal Regular Shallow clear Abdomen is not distended She has no edema She has advanced dementia  Appreciate SLP evaluation and input.   PPS 20%  Hypernatremia Advanced dementia with current further progressive decline Dysphagia.   Call placed and discussed with HCPOA daughter Dewayne Hatchnn this am 9-22. She is awaiting further information about residential hospice. [place] locally in [place], [facility].   Recommend residential hospices Prognosis likely less than 2 weeks CSW consulted to help facilitate. Discussed with CSW colleague Ms Mardella LaymanLindsey who will reach out to family today.  Continue comfort measures.   25 minutes spent Rosalin HawkingZeba Zameer Borman MD [facility] team [phone]  [phone]

## 2017-12-04 NOTE — Progress Notes (Signed)
[facility] Liaison RN note @4 :15pm  Received request from CSW for family interest in [facility]. Chart reviewed and spoke with family (Daughter Conley Rollsthel Chiles [phone]) to acknowledge referral.  Unfortunately [facility] is not able to offer a room today. Family and CSW are aware [facility] liaison will follow up with CSW and family tomorrow or sooner if room becomes available. Please do not hesitate to call with questions.  Thank you. ?  Gerri SporeLisa Matthews,RN ,BSN [facility] Liaison  [phone]   [facility] Lias[facility] are on AMION

## 2017-12-04 NOTE — Clinical Social Work Note (Addendum)
Clinical Social Work Assessment  Patient Details  Name: Pamela Ramirez MRN: [id number] Date of Birth: May 16, 1921  Date of referral:  12/03/17               Reason for consult:  End of Life/Hospice                Permission sought to share information with:  Facility Art therapist granted to share information::  Yes, Verbal Permission Granted  Name::     Lynnell Chad  Agency::  [employer]  Relationship::  Daughter  Contact Information:  [phone]  Housing/Transportation Living arrangements for the past 2 months:  [place] of Information:  Adult Children Patient Interpreter Needed:  None Criminal Activity/Legal Involvement Pertinent to Current Situation/Hospitalization:  No - Comment as needed Significant Relationships:  Adult Children, Other Family Members Lives with:  Facility Resident Do you feel safe going back to the place where you live?  No Need for family participation in patient care:  Yes (Comment)  Care giving concerns:  Patient is resident of [facility] but has been recommended residential hospice. Family has chosen [employer].   Social Worker assessment / plan:  CSW met with patient's daughter at bedside to discuss discharge plans for residential hospice. Patient asleep, not oriented, and has memory impairment.   Patient's daughter reports patient was previously living at [facility] but family knows she is unable to return due to current condition. She had questions regarding relating information to [facility] because they have been asking if patient will be returning. CSW told her she can tell facility patient will be transferring to residential hospice, if she wishes.  Patient's daughter IT sales professional for residential hospice. She had questions regarding what hospice means for patient's diagnosis. CSW explained that if residential hospice is recommended and she is accepted, patient's  prognosis is likely less than two weeks. Patient's daughter reported understanding and stated she just wanted to clarify because she wanted to make sure she is relaying the correct information to family members. Patient has large supportive family in the [place] area.  CSW told patient's daughter that [facility] does not have bed available today but they are aware of patient and will likely have a bed open within the next couple days.  CSW made referral to [facility] and will continue to follow for discharge coordination.   Employment status:  Retired Forensic scientist:  Medicare PT Recommendations:  Not assessed at this time [place] / Referral to community resources:  Other (Comment Required)(Hospice)  Patient/Family's Response to care:  Family is appreciative of care and of CSW answering questions regarding discharge to hospice.  Patient/Family's Understanding of and Emotional Response to Diagnosis, Current Treatment, and Prognosis:  Patient's daughter understands hospice process and what to expect in the coming days. Patient's daughter understands patient's prognosis is less than two weeks.   Emotional Assessment Appearance:  Appears stated age Attitude/Demeanor/Rapport:  Unable to Assess Affect (typically observed):  Unable to Assess Orientation:    Alcohol / Substance use:  Not Applicable Psych involvement (Current and /or in the community):  No (Comment)  Discharge Needs  Concerns to be addressed:  Care Coordination Readmission within the last 30 days:  No Current discharge risk:    Barriers to Discharge:  Hospice Bed not available   Pricilla Holm, [place] 12/04/2017, 2:29 PM

## 2017-12-04 NOTE — Progress Notes (Signed)
PROGRESS NOTE    Pamela SharkBeatrice D Rockett   ZOX:[id number]  DOB: 01-16-1922  DOA: 12/01/2017 PCP: Ron ParkerBowen, Samuel, MD   Brief Narrative:  Pamela Ramirez  is a 84 y.o. female, with past medical history significant for diabetes mellitus, alzheimer's dementia  who was sent from the nursing home for evaluation of cough and excessive mucus production. She has been noted to have increased dysphagia at the facility where she has been residing. In the ED, she is noted to be hypernatremic with a sodium of 151.    Subjective: Nonverbal.  Has been wheezing.  Assessment & Plan:   Principal Problem:   Hypernatremia - improved - have stopped IVF  Active Problems:   Alzheimer's dementia - severe to the point that she is not verbal and is total care- palliative care involved - she has been transitioned to comfort care  Dysphagia? -  D1 thin liquids are recommended by speech therapist - she is not eating or drinking enough to maintain basic body functions-   Wheezing - possibly due to silent aspiration- cont Nebs PRN- adding scopolamine patch due to increased secretions  Disposition: residential hospice recommended   DVT prophylaxis: Heparin Code Status: DNR Family Communication: daughters at bedside Disposition Plan: awaiting [facility] Consultants:   Palliative care Procedures:   none Antimicrobials:  Anti-infectives (From admission, onward)   None       Objective: Vitals:   12/04/17 0141 12/04/17 0550 12/04/17 0809 12/04/17 0812  BP:  122/64    Pulse:  80    Resp:  16    Temp:  98.3 F (36.8 C)    TempSrc:  Oral    SpO2: 97% 98% 95% 95%  Weight:      Height:        Intake/Output Summary (Last 24 hours) at 12/04/2017 1319 Last data filed at 12/04/2017 1100 Gross per 24 hour  Intake 2253.06 ml  Output 1000 ml  Net 1253.06 ml   Filed Weights   12/01/17 1649 12/02/17 0142  Weight: 72.6 kg 70.8 kg    Examination: General exam: Appears comfortable - will not  open eyes or follow commands- non verbal HEENT: PERRL  Respiratory system: mild b/l wheezing. Respiratory effort normal. Cardiovascular system: S1 & S2 heard, RRR.   Gastrointestinal system: Abdomen soft, non-tender, nondistended. Normal bowel sound. No organomegaly Central nervous system:  unable to assess Extremities: No cyanosis, clubbing or edema Skin: No rashes or ulcers Psychiatry:  Cannot assess    Data Reviewed: I have personally reviewed following labs and imaging studies  CBC: Recent Labs  Lab 12/01/17 2107 12/01/17 2113  WBC 5.3  --   NEUTROABS 3.6  --   HGB 13.1 13.9  HCT 40.6 41.0  MCV 97.6  --   PLT 262  --    Basic Metabolic Panel: Recent Labs  Lab 12/01/17 2107 12/01/17 2113 12/02/17 0424 12/03/17 0542  NA 151* 147* 146* 142  K 5.1 4.8 4.4 3.8  CL 111 108 110 105  CO2 33*  --  25 29  GLUCOSE 128* 124* 145* 83  BUN 19 19 18 18   CREATININE 0.78 0.80 0.66 0.81  CALCIUM 10.4*  --  9.8 9.3   GFR: Estimated Creatinine Clearance: 39.2 mL/min (by C-G formula based on SCr of 0.81 mg/dL). Liver Function Tests: Recent Labs  Lab 12/01/17 2107  AST 20  ALT 24  ALKPHOS 77  BILITOT 0.6  PROT 7.6  ALBUMIN 3.9   No results for input(s):  LIPASE, AMYLASE in the last 168 hours. No results for input(s): AMMONIA in the last 168 hours. Coagulation Profile: No results for input(s): INR, PROTIME in the last 168 hours. Cardiac Enzymes: No results for input(s): CKTOTAL, CKMB, CKMBINDEX, TROPONINI in the last 168 hours. BNP (last 3 results) No results for input(s): PROBNP in the last 8760 hours. HbA1C: No results for input(s): HGBA1C in the last 72 hours. CBG: Recent Labs  Lab 12/03/17 1143 12/03/17 1645 12/03/17 2206 12/04/17 0731 12/04/17 1152  GLUCAP 99 90 84 65* 55*   Lipid Profile: No results for input(s): CHOL, HDL, LDLCALC, TRIG, CHOLHDL, LDLDIRECT in the last 72 hours. Thyroid Function Tests: Recent Labs    12/02/17 0424  TSH 0.972    Anemia Panel: No results for input(s): VITAMINB12, FOLATE, FERRITIN, TIBC, IRON, RETICCTPCT in the last 72 hours. Urine analysis:    Component Value Date/Time   COLORURINE YELLOW 11/11/2017 2046   APPEARANCEUR CLEAR 11/11/2017 2046   LABSPEC 1.017 11/11/2017 2046   PHURINE 6.0 11/11/2017 2046   GLUCOSEU NEGATIVE 11/11/2017 2046   HGBUR NEGATIVE 11/11/2017 2046   BILIRUBINUR NEGATIVE 11/11/2017 2046   KETONESUR NEGATIVE 11/11/2017 2046   PROTEINUR NEGATIVE 11/11/2017 2046   UROBILINOGEN 0.2 01/25/2015 1550   NITRITE NEGATIVE 11/11/2017 2046   LEUKOCYTESUR NEGATIVE 11/11/2017 2046   Sepsis Labs: @LABRCNTIP (procalcitonin:4,lacticidven:4) ) Recent Results (from the past 240 hour(s))  MRSA PCR Screening     Status: None   Collection Time: 12/02/17  6:31 AM  Result Value Ref Range Status   MRSA by PCR NEGATIVE NEGATIVE Final    Comment:        The GeneXpert MRSA Assay (FDA approved for NASAL specimens only), is one component of a comprehensive MRSA colonization surveillance program. It is not intended to diagnose MRSA infection nor to guide or monitor treatment for MRSA infections. Performed at [facility], [address], [place], [place] [address]          Radiology Studies: No results found.    Scheduled Meds: . heparin  5,000 Units Subcutaneous BID  . ipratropium-albuterol  3 mL Nebulization Q6H  . scopolamine  1 patch Transdermal Q72H   Continuous Infusions: . morphine 1 mg/hr (12/04/17 0706)     LOS: 1 day    Time spent in minutes: 35    Calvert Cantor, MD [facility] Pager: [email or web address] Password [email or web address] 12/04/2017, 1:19 PM

## 2017-12-05 LAB — GLUCOSE, CAPILLARY: GLUCOSE-CAPILLARY: 91 mg/dL (ref 70–99)

## 2017-12-05 MED ORDER — IPRATROPIUM-ALBUTEROL 0.5-2.5 (3) MG/3ML IN SOLN
3.0000 mL | Freq: Three times a day (TID) | RESPIRATORY_TRACT | Status: DC
  Administered 2017-12-06 (×2): 3 mL via RESPIRATORY_TRACT
  Filled 2017-12-05 (×2): qty 3

## 2017-12-05 NOTE — Progress Notes (Signed)
[facility] - [facility] ([facility]) - RN Note at 1356  [facility]Marland Kitchen.Marland Kitchen.No beds available for patient today.  CSW TrappeMegan and Daughter Dareen Pianonn Shears made aware this morning.    Will continue to follow up with CSW throughout day and in the morning on [facility].  Thank you for this referral,   Roda ShuttersSherry Gibson, RN [facility] Hospital Liaison [phone]  [facility] Hospital Liaisons on [place]

## 2017-12-05 NOTE — Progress Notes (Signed)
Wasted 40 cc's/40mg  of patient's morphine drip (1mg /ml) with Fredrich RomansBianca Cherry, RN.

## 2017-12-05 NOTE — Progress Notes (Signed)
PROGRESS NOTE    Pamela Ramirez   ZOX:[id number]  DOB: [birth year]  DOA: 12/01/2017 PCP: Ron Parker, MD   Brief Narrative:  Pamela Ramirez  is a 84 y.o. female, with past medical history significant for diabetes mellitus, alzheimer's dementia  who was sent from the nursing home for evaluation of cough and excessive mucus production. She has been noted to have increased dysphagia at the facility where she has been residing. In the ED, she is noted to be hypernatremic with a sodium of 151.    Subjective: Nonverbal.     Assessment & Plan:   Principal Problem:   Hypernatremia - improved - have stopped IVF  Active Problems:   Alzheimer's dementia - severe to the point that she is not verbal and is total care- palliative care involved - she has been transitioned to comfort care  Dysphagia? -  D1 thin liquids are recommended by speech therapist - she is not eating or drinking enough to maintain basic body functions-   Wheezing - possibly due to silent aspiration- cont Nebs PRN- added scopolamine patch due to increased secretions  Disposition: residential hospice recommended   DVT prophylaxis: Heparin Code Status: DNR Family Communication: daughters at bedside Disposition Plan: still awaiting [facility] bed Consultants:   Palliative care Procedures:   none Antimicrobials:  Anti-infectives (From admission, onward)   None       Objective: Vitals:   12/04/17 2231 12/05/17 0112 12/05/17 0508 12/05/17 0740  BP: 115/64  110/63   Pulse: 93  (!) 115   Resp: 12  16   Temp: 100 F (37.8 C)  99.3 F (37.4 C)   TempSrc: Oral  Oral   SpO2: 100% 99% 96% 95%  Weight:      Height:        Intake/Output Summary (Last 24 hours) at 12/05/2017 1204 Last data filed at 12/05/2017 0059 Gross per 24 hour  Intake 121.17 ml  Output 100 ml  Net 21.17 ml   Filed Weights   12/01/17 1649 12/02/17 0142  Weight: 72.6 kg 70.8 kg    Examination: General exam: Appears  comfortable - does not interact- nonverbal Respiratory system: Clear to auscultation. Respiratory effort normal. Cardiovascular system: S1 & S2 heard,  No murmurs  Gastrointestinal system: Abdomen soft, non-tender, nondistended. Normal bowel sound. No organomegaly Extremities: No cyanosis, clubbing or edema Skin: No rashes or ulcers    Data Reviewed: I have personally reviewed following labs and imaging studies  CBC: Recent Labs  Lab 12/01/17 2107 12/01/17 2113  WBC 5.3  --   NEUTROABS 3.6  --   HGB 13.1 13.9  HCT 40.6 41.0  MCV 97.6  --   PLT 262  --    Basic Metabolic Panel: Recent Labs  Lab 12/01/17 2107 12/01/17 2113 12/02/17 0424 12/03/17 0542  NA 151* 147* 146* 142  K 5.1 4.8 4.4 3.8  CL 111 108 110 105  CO2 33*  --  25 29  GLUCOSE 128* 124* 145* 83  BUN 19 19 18 18   CREATININE 0.78 0.80 0.66 0.81  CALCIUM 10.4*  --  9.8 9.3   GFR: Estimated Creatinine Clearance: 39.2 mL/min (by C-G formula based on SCr of 0.81 mg/dL). Liver Function Tests: Recent Labs  Lab 12/01/17 2107  AST 20  ALT 24  ALKPHOS 77  BILITOT 0.6  PROT 7.6  ALBUMIN 3.9   No results for input(s): LIPASE, AMYLASE in the last 168 hours. No results for input(s): AMMONIA in the  last 168 hours. Coagulation Profile: No results for input(s): INR, PROTIME in the last 168 hours. Cardiac Enzymes: No results for input(s): CKTOTAL, CKMB, CKMBINDEX, TROPONINI in the last 168 hours. BNP (last 3 results) No results for input(s): PROBNP in the last 8760 hours. HbA1C: No results for input(s): HGBA1C in the last 72 hours. CBG: Recent Labs  Lab 12/04/17 0731 12/04/17 1152 12/04/17 2225 12/04/17 2313 12/05/17 0754  GLUCAP 65* 55* 59* 108* 91   Lipid Profile: No results for input(s): CHOL, HDL, LDLCALC, TRIG, CHOLHDL, LDLDIRECT in the last 72 hours. Thyroid Function Tests: No results for input(s): TSH, T4TOTAL, FREET4, T3FREE, THYROIDAB in the last 72 hours. Anemia Panel: No results for  input(s): VITAMINB12, FOLATE, FERRITIN, TIBC, IRON, RETICCTPCT in the last 72 hours. Urine analysis:    Component Value Date/Time   COLORURINE YELLOW 11/11/2017 2046   APPEARANCEUR CLEAR 11/11/2017 2046   LABSPEC 1.017 11/11/2017 2046   PHURINE 6.0 11/11/2017 2046   GLUCOSEU NEGATIVE 11/11/2017 2046   HGBUR NEGATIVE 11/11/2017 2046   BILIRUBINUR NEGATIVE 11/11/2017 2046   KETONESUR NEGATIVE 11/11/2017 2046   PROTEINUR NEGATIVE 11/11/2017 2046   UROBILINOGEN 0.2 01/25/2015 1550   NITRITE NEGATIVE 11/11/2017 2046   LEUKOCYTESUR NEGATIVE 11/11/2017 2046   Sepsis Labs: @LABRCNTIP (procalcitonin:4,lacticidven:4) ) Recent Results (from the past 240 hour(s))  MRSA PCR Screening     Status: None   Collection Time: 12/02/17  6:31 AM  Result Value Ref Range Status   MRSA by PCR NEGATIVE NEGATIVE Final    Comment:        The GeneXpert MRSA Assay (FDA approved for NASAL specimens only), is one component of a comprehensive MRSA colonization surveillance program. It is not intended to diagnose MRSA infection nor to guide or monitor treatment for MRSA infections. Performed at [facility], [address] [place], [place] [id number]          Radiology Studies: No results found.    Scheduled Meds: . heparin  5,000 Units Subcutaneous BID  . ipratropium-albuterol  3 mL Nebulization Q6H  . scopolamine  1 patch Transdermal Q72H   Continuous Infusions: . dextrose 50 mL/hr at 12/05/17 0059  . morphine 1 mg/hr (12/05/17 0059)     LOS: 2 days    Time spent in minutes: 35    Calvert CantorSaima Andie Mungin, MD [facility] Pager: [email or web address] Password [email or web address] 12/05/2017, 12:04 PM

## 2017-12-05 NOTE — Progress Notes (Signed)
PMT no charge note  Awaiting residential hospice bed Continue current mode of care.  No additional PMT specific recommendations at this time.   Rosalin HawkingZeba Sophie Quiles MD [facility] palliative medicine team [phone]  [phone]

## 2017-12-06 MED ORDER — SCOPOLAMINE 1 MG/3DAYS TD PT72: 1.0000 | MEDICATED_PATCH | TRANSDERMAL | 12 refills | Status: AC

## 2017-12-06 MED ORDER — HALOPERIDOL 0.5 MG PO TABS: 0.5000 mg | ORAL_TABLET | ORAL | Status: AC | PRN

## 2017-12-06 NOTE — Progress Notes (Signed)
[facility] room available for patient today. Met with daughter Lelon Frohlich to complete paper work for transfer today. Discharge summary has been sent. Thank you CSW Joelene Millin!  RN please call report to [phone].  Thank you,  Erling Conte, LCSW [phone]

## 2017-12-06 NOTE — Progress Notes (Signed)
Wasted 82 ML of Morphine, witnessed by Charlynne Panderegina Baldwin, as [facility]

## 2017-12-06 NOTE — Progress Notes (Signed)
   12/06/17 1100  Clinical Encounter Type  Visited With Patient and family together  Visit Type Initial;Psychological support;Spiritual support  Referral From Nurse  Consult/Referral To Chaplain  Spiritual Encounters  Spiritual Needs Emotional;Other (Comment) (Spiritual Care Conversation/support)  Stress Factors  Patient Stress Factors Not reviewed  Family Stress Factors Health changes;Major life changes   I briefly visited with the patient and her family per spiritual care consult. The patient was sleeping most of our visit. The family did not state any needs at this time.   Please, contact Spiritual Care for further assistance.   Chaplain Clint BolderBrittany Huriel Matt M.Div., [facility]

## 2017-12-06 NOTE — Progress Notes (Signed)
[facility]. Paperwork is currently being completed.   PTAR contacted, patient's family at bedside and notified. Patient's RN can call report to [phone], packet complete. CSW signing off, no other needs identified at this time.  Celso SickleKimberly Lashaun Poch, [place] Clinical Social Worker [facility] Cell#: [phone]

## 2017-12-06 NOTE — Discharge Summary (Signed)
Physician Discharge Summary  Pamela Ramirez ZOX:[id number] DOB: 06-05-1921 DOA: 12/01/2017  PCP: Ron Parker, MD  Admit date: 12/01/2017 Discharge date: 12/06/2017  Admitted From: home Disposition:  Residential hospice home       Discharge Condition:  stable   CODE STATUS:  DNR   Diet recommendation:  Regular diet as tolerated Consultations:  Palliative care    Discharge Diagnoses:  Principal Problem:   Hypernatremia Active Problems:   Alzheimer's dementia   Pressure injury of skin   Brief Summary: Pamela Ramirez is a68 y.o.female,with past medical history significant for diabetes mellitus, alzheimer's dementia  who was sent from the nursing home for evaluation of cough and excessive mucus production. She has been noted to have increased dysphagia at the facility where she has been residing. In the ED, she is noted to be hypernatremic with a sodium of 151.   Hospital Course:  Principal Problem:   Hypernatremia - improved - have stopped IVF  Active Problems:   Alzheimer's dementia - severe to the point that she is not verbal and is total care- palliative care involved - she has been transitioned to comfort care  Dysphagia? -  D1 thin liquids are recommended by speech therapist - she is not eating or drinking enough to maintain basic needs  Wheezing - possibly due to silent aspiration- cont Nebs PRN- added scopolamine patch due to increased secretions  Disposition: residential hospice recommended   Discharge Exam: Vitals:   12/06/17 0623 12/06/17 0819  BP: 135/89   Pulse: (!) 105   Resp: 18   Temp: 100.2 F (37.9 C)   SpO2: 97% 96%   Vitals:   12/05/17 0740 12/05/17 1941 12/06/17 0623 12/06/17 0819  BP:   135/89   Pulse:   (!) 105   Resp:   18   Temp:   100.2 F (37.9 C)   TempSrc:   Oral   SpO2: 95% 98% 97% 96%  Weight:      Height:        General: Pt is alert, awake, not in acute distress Cardiovascular: RRR, S1/S2 +, no rubs, no  gallops Respiratory: CTA bilaterally, no wheezing, no rhonchi Abdominal: Soft, NT, ND, bowel sounds + Extremities: no edema, no cyanosis   Discharge Instructions   Allergies as of 12/06/2017   No Known Allergies     Medication List    TAKE these medications   acetaminophen 650 MG CR tablet Commonly known as:  TYLENOL Take 650 mg by mouth 3 (three) times daily. What changed:  Another medication with the same name was removed. Continue taking this medication, and follow the directions you see here.   alum & mag hydroxide-simeth 200-200-20 MG/5ML suspension Commonly known as:  MAALOX/MYLANTA Take 30 mLs by mouth every 6 (six) hours as needed for indigestion or heartburn (Not to exceed 4 doses in 24 hours.).   divalproex 125 MG capsule Commonly known as:  DEPAKOTE SPRINKLE Take 250 mg by mouth 3 (three) times daily.   eucerin cream Apply 1 application topically 2 (two) times daily.   BAZA PROTECT EX Apply 1 application topically as needed (For buttocks after each incontinence episode.).   guaifenesin 100 MG/5ML syrup Commonly known as:  ROBITUSSIN Take 200 mg by mouth every 6 (six) hours as needed for cough (Do not exceed 4 doses in 24 hours.).   haloperidol 0.5 MG tablet Commonly known as:  HALDOL Take 1 tablet (0.5 mg total) by mouth every 4 (four) hours as needed for  agitation.   loperamide 2 MG capsule Commonly known as:  IMODIUM Take 2 mg by mouth as needed for diarrhea or loose stools (Not to exceed 8 doses in 24 hours.).   magnesium hydroxide 400 MG/5ML suspension Commonly known as:  MILK OF MAGNESIA Take 30 mLs by mouth at bedtime as needed (For constipation.).   risperiDONE 0.5 MG tablet Commonly known as:  RISPERDAL Take 0.5 mg by mouth 3 (three) times daily as needed (for agitation).   scopolamine 1 MG/3DAYS Commonly known as:  TRANSDERM-SCOP Place 1 patch (1.5 mg total) onto the skin every 3 (three) days. Start taking on:  12/07/2017   TRIPLE  ANTIBIOTIC [phone] Oint Apply 1 application topically as needed (For minor skin tears or abrasions.).       No Known Allergies   Procedures/Studies:    Dg Chest 2 View  Result Date: 12/01/2017 CLINICAL DATA:  Dementia.  Cough. EXAM: CHEST - 2 VIEW COMPARISON:  11/11/2017 FINDINGS: The lungs are clear without focal pneumonia, edema, pneumothorax or pleural effusion. The cardiopericardial silhouette is within normal limits for size. There is pulmonary vascular congestion without overt pulmonary edema. The visualized bony structures of the thorax are intact. Telemetry leads overlie the chest. IMPRESSION: Vascular congestion without acute cardiopulmonary findings. Electronically Signed   By: Kennith Center M.D.   On: 12/01/2017 18:00   Dg Chest 2 View  Result Date: 11/11/2017 CLINICAL DATA:  Patient with wheezing. EXAM: CHEST - 2 VIEW COMPARISON:  Chest radiograph 09/26/2017. FINDINGS: Patient is mildly rotated to the right. Monitoring leads overlie the patient. Stable enlarged cardiac and mediastinal contours. Minimal heterogeneous opacities within the lower lungs bilaterally. No pleural effusion or pneumothorax. Unchanged smooth nodular density projecting over the right lower hemithorax. Lateral view limited due to overlapping tissues. Thoracic spine degenerative changes. IMPRESSION: Bibasilar heterogeneous opacities favored to represent atelectasis. Infection not excluded. Smooth nodular density projecting over the right lower hemithorax is nonspecific and may represent loculated fluid. Recommend short-term follow-up chest radiograph to assess for stability or resolution. Electronically Signed   By: Annia Belt M.D.   On: 11/11/2017 20:20   Ct Soft Tissue Neck Wo Contrast  Result Date: 12/01/2017 CLINICAL DATA:  Spitting up mucus, assess for foreign body. History of dementia. EXAM: CT NECK WITHOUT CONTRAST TECHNIQUE: Multidetector CT imaging of the neck was performed following the standard  protocol without intravenous contrast. COMPARISON:  CT HEAD September 25, 2017 and CT cervical spine January 24, 2016 FINDINGS: Moderately motion degraded examination. PHARYNX AND LARYNX: Normal.  Widely patent airway. SALIVARY GLANDS: Normal. THYROID: Normal. LYMPH NODES: No lymphadenopathy by CT size criteria, sensitivity decreased without intravenous contrast. VASCULAR: Moderate calcific atherosclerosis carotid siphon, carotid bifurcations and aortic arch. LIMITED INTRACRANIAL: Severe mesial temporal lobe atrophy compatible with neuro degenerative syndromes. VISUALIZED ORBITS: Status post bilateral ocular lens implants. MASTOIDS AND VISUALIZED PARANASAL SINUSES: Stable small RIGHT frontal sinus soft tissue mass with cortical dehiscence most compatible with mucocele. SKELETON: Nonacute. Patient is edentulous. Severe upper cervical spondylosis. Mild chronic T3 compression fracture. UPPER CHEST: Lung apices are clear. Mild fluid distended esophagus with air-fluid level proximally. OTHER: None. IMPRESSION: 1. Motion degraded examination. No radiopaque foreign bodies. Widely patent airway. 2. Mild fluid distended esophagus. Electronically Signed   By: Awilda Metro M.D.   On: 12/01/2017 19:38   Ct Chest W Contrast  Result Date: 11/11/2017 CLINICAL DATA:  Chronic dyspnea with episodic wheezing and more lethargy today. EXAM: CT CHEST WITH CONTRAST TECHNIQUE: Multidetector CT imaging of the chest  was performed during intravenous contrast administration. CONTRAST:  75mL OMNIPAQUE IOHEXOL 300 MG/ML  SOLN COMPARISON:  CXR 11/11/2017 and chest CT 01/25/2015 FINDINGS: Cardiovascular: Mild atherosclerosis of the great vessels at the origins. Moderate calcific atherosclerosis of the thoracic aorta without aneurysm or dissection. No large central pulmonary embolus identified. Heart size is top normal without pericardial effusion or thickening. Left main and three-vessel coronary arteriosclerosis is identified.  Mediastinum/Nodes: No enlarged mediastinal, hilar, or axillary lymph nodes. Thyroid gland, trachea, and esophagus demonstrate no significant findings. Lungs/Pleura: Masslike opacities felt to represent fluid within the fissures are identified on the right, the largest measuring approximately 1.9 x 1.2 cm, series 5/73 and along the major fissure measuring 18 x 11 mm, series 5/58. Dependent atelectasis is otherwise noted of the lungs, more so in both lower lobes. No pneumothorax. Upper Abdomen: Stable water attenuating cysts of the liver and included kidneys, the largest in the upper pole of the left kidney measuring 4.3 x 3.4 x 4.1 cm. Musculoskeletal: No acute nor aggressive osseous lesions. IMPRESSION: 1. Loculated fluid collections within the right minor and major fissures consistent with pseudo-lesions of the lungs. Dependent atelectasis without acute pulmonary consolidations. 2. No active pulmonary disease. 3. Left main and three-vessel coronary arteriosclerosis. No large central pulmonary embolus. Aortic atherosclerosis without aneurysm or dissection. 4. Cysts of the liver and left kidney is above. Aortic Atherosclerosis (ICD10-I70.0). Electronically Signed   By: Tollie Ethavid  Kwon M.D.   On: 11/11/2017 22:59     The results of significant diagnostics from this hospitalization (including imaging, microbiology, ancillary and laboratory) are listed below for reference.     Microbiology: Recent Results (from the past 240 hour(s))  MRSA PCR Screening     Status: None   Collection Time: 12/02/17  6:31 AM  Result Value Ref Range Status   MRSA by PCR NEGATIVE NEGATIVE Final    Comment:        The GeneXpert MRSA Assay (FDA approved for NASAL specimens only), is one component of a comprehensive MRSA colonization surveillance program. It is not intended to diagnose MRSA infection nor to guide or monitor treatment for MRSA infections. Performed at [facility], [address]  [place], [place] [address]      Labs: BNP (last 3 results) Recent Labs    12/01/17 2107  BNP 153.3*   Basic Metabolic Panel: Recent Labs  Lab 12/01/17 2107 12/01/17 2113 12/02/17 0424 12/03/17 0542  NA 151* 147* 146* 142  K 5.1 4.8 4.4 3.8  CL 111 108 110 105  CO2 33*  --  25 29  GLUCOSE 128* 124* 145* 83  BUN 19 19 18 18   CREATININE 0.78 0.80 0.66 0.81  CALCIUM 10.4*  --  9.8 9.3   Liver Function Tests: Recent Labs  Lab 12/01/17 2107  AST 20  ALT 24  ALKPHOS 77  BILITOT 0.6  PROT 7.6  ALBUMIN 3.9   No results for input(s): LIPASE, AMYLASE in the last 168 hours. No results for input(s): AMMONIA in the last 168 hours. CBC: Recent Labs  Lab 12/01/17 2107 12/01/17 2113  WBC 5.3  --   NEUTROABS 3.6  --   HGB 13.1 13.9  HCT 40.6 41.0  MCV 97.6  --   PLT 262  --    Cardiac Enzymes: No results for input(s): CKTOTAL, CKMB, CKMBINDEX, TROPONINI in the last 168 hours. BNP: Invalid input(s): POCBNP CBG: Recent Labs  Lab 12/04/17 0731 12/04/17 1152 12/04/17 2225 12/04/17 2313 12/05/17 0754  GLUCAP  65* 55* 59* 108* 91   D-Dimer No results for input(s): DDIMER in the last 72 hours. Hgb A1c No results for input(s): HGBA1C in the last 72 hours. Lipid Profile No results for input(s): CHOL, HDL, LDLCALC, TRIG, CHOLHDL, LDLDIRECT in the last 72 hours. Thyroid function studies No results for input(s): TSH, T4TOTAL, T3FREE, THYROIDAB in the last 72 hours.  Invalid input(s): FREET3 Anemia work up No results for input(s): VITAMINB12, FOLATE, FERRITIN, TIBC, IRON, RETICCTPCT in the last 72 hours. Urinalysis    Component Value Date/Time   COLORURINE YELLOW 11/11/2017 2046   APPEARANCEUR CLEAR 11/11/2017 2046   LABSPEC 1.017 11/11/2017 2046   PHURINE 6.0 11/11/2017 2046   GLUCOSEU NEGATIVE 11/11/2017 2046   HGBUR NEGATIVE 11/11/2017 2046   BILIRUBINUR NEGATIVE 11/11/2017 2046   KETONESUR NEGATIVE 11/11/2017 2046   PROTEINUR NEGATIVE 11/11/2017 2046    UROBILINOGEN 0.2 01/25/2015 1550   NITRITE NEGATIVE 11/11/2017 2046   LEUKOCYTESUR NEGATIVE 11/11/2017 2046   Sepsis Labs Invalid input(s): PROCALCITONIN,  WBC,  LACTICIDVEN Microbiology Recent Results (from the past 240 hour(s))  MRSA PCR Screening     Status: None   Collection Time: 12/02/17  6:31 AM  Result Value Ref Range Status   MRSA by PCR NEGATIVE NEGATIVE Final    Comment:        The GeneXpert MRSA Assay (FDA approved for NASAL specimens only), is one component of a comprehensive MRSA colonization surveillance program. It is not intended to diagnose MRSA infection nor to guide or monitor treatment for MRSA infections. Performed at [facility], [address], [place], [place] [address]      Time coordinating discharge in minutes: 55  SIGNED:   Calvert Cantor, MD  [facility] 12/06/2017, 4:26 PM Pager   If 7PM-7AM, please contact night-coverage [email or web address] Password [email or web address]

## 2018-01-13 DEATH — deceased
